# Patient Record
Sex: Female | Born: 1940 | Race: White | Hispanic: No | Smoking: Never smoker
Health system: Southern US, Community
[De-identification: ages and names within clinical notes are randomized; demographics above are authoritative.]

## PROBLEM LIST (undated history)

## (undated) DIAGNOSIS — E039 Hypothyroidism, unspecified: Secondary | ICD-10-CM

## (undated) DIAGNOSIS — H409 Unspecified glaucoma: Secondary | ICD-10-CM

## (undated) DIAGNOSIS — M8580 Other specified disorders of bone density and structure, unspecified site: Secondary | ICD-10-CM

## (undated) DIAGNOSIS — Z853 Personal history of malignant neoplasm of breast: Secondary | ICD-10-CM

## (undated) HISTORY — DX: Other specified disorders of bone density and structure, unspecified site: M85.80

## (undated) HISTORY — DX: Unspecified glaucoma: H40.9

## (undated) HISTORY — PX: OTHER SURGICAL HISTORY: SHX169

## (undated) HISTORY — DX: Hypothyroidism, unspecified: E03.9

## (undated) HISTORY — DX: Personal history of malignant neoplasm of breast: Z85.3

---

## 1996-11-30 DIAGNOSIS — Z853 Personal history of malignant neoplasm of breast: Secondary | ICD-10-CM

## 1996-11-30 HISTORY — PX: BREAST LUMPECTOMY: SHX2

## 1996-11-30 HISTORY — DX: Personal history of malignant neoplasm of breast: Z85.3

## 1996-11-30 HISTORY — PX: OTHER SURGICAL HISTORY: SHX169

## 1998-02-28 ENCOUNTER — Encounter: Admission: RE | Admit: 1998-02-28 | Discharge: 1998-05-29 | Payer: Self-pay | Admitting: Radiation Oncology

## 1998-04-23 ENCOUNTER — Other Ambulatory Visit: Admission: RE | Admit: 1998-04-23 | Discharge: 1998-04-23 | Payer: Self-pay | Admitting: Gynecology

## 1998-04-25 ENCOUNTER — Other Ambulatory Visit: Admission: RE | Admit: 1998-04-25 | Discharge: 1998-04-25 | Payer: Self-pay | Admitting: Gynecology

## 1998-06-06 ENCOUNTER — Ambulatory Visit (HOSPITAL_COMMUNITY): Admission: RE | Admit: 1998-06-06 | Discharge: 1998-06-06 | Payer: Self-pay | Admitting: Gynecology

## 1999-05-19 ENCOUNTER — Other Ambulatory Visit: Admission: RE | Admit: 1999-05-19 | Discharge: 1999-05-19 | Payer: Self-pay | Admitting: Gynecology

## 1999-08-29 ENCOUNTER — Ambulatory Visit (HOSPITAL_COMMUNITY): Admission: RE | Admit: 1999-08-29 | Discharge: 1999-08-29 | Payer: Self-pay | Admitting: Gastroenterology

## 2000-06-28 ENCOUNTER — Other Ambulatory Visit: Admission: RE | Admit: 2000-06-28 | Discharge: 2000-06-28 | Payer: Self-pay | Admitting: Gynecology

## 2004-08-05 ENCOUNTER — Other Ambulatory Visit: Admission: RE | Admit: 2004-08-05 | Discharge: 2004-08-05 | Payer: Self-pay | Admitting: Obstetrics and Gynecology

## 2004-10-22 ENCOUNTER — Encounter (INDEPENDENT_AMBULATORY_CARE_PROVIDER_SITE_OTHER): Payer: Self-pay | Admitting: Internal Medicine

## 2004-10-22 ENCOUNTER — Ambulatory Visit: Payer: Self-pay | Admitting: Family Medicine

## 2004-10-24 ENCOUNTER — Encounter (INDEPENDENT_AMBULATORY_CARE_PROVIDER_SITE_OTHER): Payer: Self-pay | Admitting: Internal Medicine

## 2004-10-24 ENCOUNTER — Encounter: Admission: RE | Admit: 2004-10-24 | Discharge: 2004-10-24 | Payer: Self-pay | Admitting: Family Medicine

## 2004-11-11 ENCOUNTER — Ambulatory Visit: Payer: Self-pay | Admitting: Family Medicine

## 2005-02-13 ENCOUNTER — Ambulatory Visit: Payer: Self-pay | Admitting: Hematology & Oncology

## 2005-03-26 ENCOUNTER — Ambulatory Visit: Payer: Self-pay | Admitting: Family Medicine

## 2005-03-26 ENCOUNTER — Encounter (INDEPENDENT_AMBULATORY_CARE_PROVIDER_SITE_OTHER): Payer: Self-pay | Admitting: Internal Medicine

## 2005-04-01 ENCOUNTER — Ambulatory Visit: Payer: Self-pay | Admitting: Family Medicine

## 2005-08-14 ENCOUNTER — Ambulatory Visit: Payer: Self-pay | Admitting: Hematology & Oncology

## 2005-08-27 ENCOUNTER — Other Ambulatory Visit: Admission: RE | Admit: 2005-08-27 | Discharge: 2005-08-27 | Payer: Self-pay | Admitting: Obstetrics and Gynecology

## 2005-12-09 ENCOUNTER — Ambulatory Visit: Payer: Self-pay | Admitting: Internal Medicine

## 2006-02-12 ENCOUNTER — Ambulatory Visit: Payer: Self-pay | Admitting: Hematology & Oncology

## 2006-08-12 ENCOUNTER — Ambulatory Visit: Payer: Self-pay | Admitting: Hematology & Oncology

## 2006-08-16 LAB — COMPREHENSIVE METABOLIC PANEL
ALT: 13 U/L (ref 0–40)
AST: 19 U/L (ref 0–37)
Albumin: 4.3 g/dL (ref 3.5–5.2)
Alkaline Phosphatase: 56 U/L (ref 39–117)
BUN: 19 mg/dL (ref 6–23)
CO2: 24 mEq/L (ref 19–32)
Calcium: 9.4 mg/dL (ref 8.4–10.5)
Chloride: 105 mEq/L (ref 96–112)
Creatinine, Ser: 0.74 mg/dL (ref 0.40–1.20)
Glucose, Bld: 120 mg/dL — ABNORMAL HIGH (ref 70–99)
Potassium: 4 mEq/L (ref 3.5–5.3)
Sodium: 140 mEq/L (ref 135–145)
Total Bilirubin: 0.5 mg/dL (ref 0.3–1.2)
Total Protein: 7.3 g/dL (ref 6.0–8.3)

## 2006-08-16 LAB — CBC WITH DIFFERENTIAL/PLATELET
BASO%: 0.3 % (ref 0.0–2.0)
Basophils Absolute: 0 10*3/uL (ref 0.0–0.1)
EOS%: 2.1 % (ref 0.0–7.0)
Eosinophils Absolute: 0.1 10*3/uL (ref 0.0–0.5)
HCT: 41 % (ref 34.8–46.6)
HGB: 14 g/dL (ref 11.6–15.9)
LYMPH%: 32 % (ref 14.0–48.0)
MCH: 30.1 pg (ref 26.0–34.0)
MCHC: 34.1 g/dL (ref 32.0–36.0)
MCV: 88.3 fL (ref 81.0–101.0)
MONO#: 0.8 10*3/uL (ref 0.1–0.9)
MONO%: 11.9 % (ref 0.0–13.0)
NEUT#: 3.6 10*3/uL (ref 1.5–6.5)
NEUT%: 53.7 % (ref 39.6–76.8)
Platelets: 215 10*3/uL (ref 145–400)
RBC: 4.65 10*6/uL (ref 3.70–5.32)
RDW: 13.3 % (ref 11.3–14.5)
WBC: 6.7 10*3/uL (ref 3.9–10.0)
lymph#: 2.1 10*3/uL (ref 0.9–3.3)

## 2006-08-16 LAB — TSH: TSH: 0.191 u[IU]/mL — ABNORMAL LOW (ref 0.350–5.500)

## 2006-09-02 ENCOUNTER — Other Ambulatory Visit: Admission: RE | Admit: 2006-09-02 | Discharge: 2006-09-02 | Payer: Self-pay | Admitting: Obstetrics and Gynecology

## 2007-02-09 ENCOUNTER — Ambulatory Visit: Payer: Self-pay | Admitting: Hematology & Oncology

## 2007-02-14 LAB — COMPREHENSIVE METABOLIC PANEL
ALT: 15 U/L (ref 0–35)
AST: 21 U/L (ref 0–37)
Albumin: 4.5 g/dL (ref 3.5–5.2)
Alkaline Phosphatase: 50 U/L (ref 39–117)
BUN: 25 mg/dL — ABNORMAL HIGH (ref 6–23)
CO2: 24 mEq/L (ref 19–32)
Calcium: 9.3 mg/dL (ref 8.4–10.5)
Chloride: 106 mEq/L (ref 96–112)
Creatinine, Ser: 0.84 mg/dL (ref 0.40–1.20)
Glucose, Bld: 97 mg/dL (ref 70–99)
Potassium: 4.2 mEq/L (ref 3.5–5.3)
Sodium: 139 mEq/L (ref 135–145)
Total Bilirubin: 0.5 mg/dL (ref 0.3–1.2)
Total Protein: 7.2 g/dL (ref 6.0–8.3)

## 2007-02-14 LAB — CBC WITH DIFFERENTIAL/PLATELET
BASO%: 0.4 % (ref 0.0–2.0)
Basophils Absolute: 0 10*3/uL (ref 0.0–0.1)
EOS%: 2.8 % (ref 0.0–7.0)
Eosinophils Absolute: 0.2 10*3/uL (ref 0.0–0.5)
HCT: 42.7 % (ref 34.8–46.6)
HGB: 14.7 g/dL (ref 11.6–15.9)
LYMPH%: 33.1 % (ref 14.0–48.0)
MCH: 30.5 pg (ref 26.0–34.0)
MCHC: 34.5 g/dL (ref 32.0–36.0)
MCV: 88.4 fL (ref 81.0–101.0)
MONO#: 0.9 10*3/uL (ref 0.1–0.9)
MONO%: 13.4 % — ABNORMAL HIGH (ref 0.0–13.0)
NEUT#: 3.3 10*3/uL (ref 1.5–6.5)
NEUT%: 50.3 % (ref 39.6–76.8)
Platelets: 205 10*3/uL (ref 145–400)
RBC: 4.83 10*6/uL (ref 3.70–5.32)
RDW: 12.9 % (ref 11.3–14.5)
WBC: 6.5 10*3/uL (ref 3.9–10.0)
lymph#: 2.2 10*3/uL (ref 0.9–3.3)

## 2007-02-14 LAB — TSH: TSH: 0.13 u[IU]/mL — ABNORMAL LOW (ref 0.350–5.500)

## 2007-08-11 ENCOUNTER — Ambulatory Visit: Payer: Self-pay | Admitting: Hematology & Oncology

## 2007-08-15 LAB — COMPREHENSIVE METABOLIC PANEL
ALT: 14 U/L (ref 0–35)
AST: 21 U/L (ref 0–37)
Albumin: 4.5 g/dL (ref 3.5–5.2)
Alkaline Phosphatase: 57 U/L (ref 39–117)
BUN: 20 mg/dL (ref 6–23)
CO2: 23 mEq/L (ref 19–32)
Calcium: 9.2 mg/dL (ref 8.4–10.5)
Chloride: 105 mEq/L (ref 96–112)
Creatinine, Ser: 0.77 mg/dL (ref 0.40–1.20)
Glucose, Bld: 99 mg/dL (ref 70–99)
Potassium: 4.3 mEq/L (ref 3.5–5.3)
Sodium: 140 mEq/L (ref 135–145)
Total Bilirubin: 0.5 mg/dL (ref 0.3–1.2)
Total Protein: 7.4 g/dL (ref 6.0–8.3)

## 2007-08-15 LAB — CBC WITH DIFFERENTIAL/PLATELET
BASO%: 0.5 % (ref 0.0–2.0)
Basophils Absolute: 0 10*3/uL (ref 0.0–0.1)
EOS%: 3.2 % (ref 0.0–7.0)
Eosinophils Absolute: 0.2 10*3/uL (ref 0.0–0.5)
HCT: 42.6 % (ref 34.8–46.6)
HGB: 14.9 g/dL (ref 11.6–15.9)
LYMPH%: 33.3 % (ref 14.0–48.0)
MCH: 30.7 pg (ref 26.0–34.0)
MCHC: 34.9 g/dL (ref 32.0–36.0)
MCV: 88 fL (ref 81.0–101.0)
MONO#: 0.8 10*3/uL (ref 0.1–0.9)
MONO%: 11.4 % (ref 0.0–13.0)
NEUT#: 3.6 10*3/uL (ref 1.5–6.5)
NEUT%: 51.6 % (ref 39.6–76.8)
Platelets: 215 10*3/uL (ref 145–400)
RBC: 4.84 10*6/uL (ref 3.70–5.32)
RDW: 13.6 % (ref 11.3–14.5)
WBC: 7.1 10*3/uL (ref 3.9–10.0)
lymph#: 2.3 10*3/uL (ref 0.9–3.3)

## 2007-08-15 LAB — TSH: TSH: 0.134 u[IU]/mL — ABNORMAL LOW (ref 0.350–5.500)

## 2007-10-17 DIAGNOSIS — J020 Streptococcal pharyngitis: Secondary | ICD-10-CM | POA: Insufficient documentation

## 2007-10-19 ENCOUNTER — Ambulatory Visit: Payer: Self-pay | Admitting: Internal Medicine

## 2007-10-19 DIAGNOSIS — Z8709 Personal history of other diseases of the respiratory system: Secondary | ICD-10-CM | POA: Insufficient documentation

## 2007-10-19 DIAGNOSIS — Z853 Personal history of malignant neoplasm of breast: Secondary | ICD-10-CM | POA: Insufficient documentation

## 2007-10-19 DIAGNOSIS — E039 Hypothyroidism, unspecified: Secondary | ICD-10-CM | POA: Insufficient documentation

## 2007-10-19 LAB — CONVERTED CEMR LAB: Rapid Strep: POSITIVE

## 2007-11-02 ENCOUNTER — Ambulatory Visit: Payer: Self-pay | Admitting: Family Medicine

## 2008-01-25 ENCOUNTER — Telehealth (INDEPENDENT_AMBULATORY_CARE_PROVIDER_SITE_OTHER): Payer: Self-pay | Admitting: Internal Medicine

## 2008-01-27 ENCOUNTER — Telehealth (INDEPENDENT_AMBULATORY_CARE_PROVIDER_SITE_OTHER): Payer: Self-pay | Admitting: Internal Medicine

## 2008-02-09 ENCOUNTER — Ambulatory Visit: Payer: Self-pay | Admitting: Hematology & Oncology

## 2008-02-13 LAB — COMPREHENSIVE METABOLIC PANEL
ALT: 14 U/L (ref 0–35)
AST: 19 U/L (ref 0–37)
Albumin: 4.6 g/dL (ref 3.5–5.2)
Alkaline Phosphatase: 54 U/L (ref 39–117)
BUN: 23 mg/dL (ref 6–23)
CO2: 25 mEq/L (ref 19–32)
Calcium: 9.5 mg/dL (ref 8.4–10.5)
Chloride: 104 mEq/L (ref 96–112)
Creatinine, Ser: 0.73 mg/dL (ref 0.40–1.20)
Glucose, Bld: 98 mg/dL (ref 70–99)
Potassium: 4 mEq/L (ref 3.5–5.3)
Sodium: 142 mEq/L (ref 135–145)
Total Bilirubin: 0.5 mg/dL (ref 0.3–1.2)
Total Protein: 7.7 g/dL (ref 6.0–8.3)

## 2008-02-13 LAB — CBC WITH DIFFERENTIAL/PLATELET
BASO%: 0.1 % (ref 0.0–2.0)
Basophils Absolute: 0 10*3/uL (ref 0.0–0.1)
EOS%: 2.8 % (ref 0.0–7.0)
Eosinophils Absolute: 0.2 10*3/uL (ref 0.0–0.5)
HCT: 42 % (ref 34.8–46.6)
HGB: 14.6 g/dL (ref 11.6–15.9)
LYMPH%: 26 % (ref 14.0–48.0)
MCH: 30.5 pg (ref 26.0–34.0)
MCHC: 34.6 g/dL (ref 32.0–36.0)
MCV: 87.9 fL (ref 81.0–101.0)
MONO#: 0.9 10*3/uL (ref 0.1–0.9)
MONO%: 10.5 % (ref 0.0–13.0)
NEUT#: 5.2 10*3/uL (ref 1.5–6.5)
NEUT%: 60.6 % (ref 39.6–76.8)
Platelets: 234 10*3/uL (ref 145–400)
RBC: 4.78 10*6/uL (ref 3.70–5.32)
RDW: 13.5 % (ref 11.3–14.5)
WBC: 8.5 10*3/uL (ref 3.9–10.0)
lymph#: 2.2 10*3/uL (ref 0.9–3.3)

## 2008-02-13 LAB — TSH: TSH: 0.185 u[IU]/mL — ABNORMAL LOW (ref 0.350–5.500)

## 2008-05-29 ENCOUNTER — Ambulatory Visit: Payer: Self-pay | Admitting: Family Medicine

## 2008-05-29 DIAGNOSIS — J019 Acute sinusitis, unspecified: Secondary | ICD-10-CM | POA: Insufficient documentation

## 2008-06-28 DIAGNOSIS — M8580 Other specified disorders of bone density and structure, unspecified site: Secondary | ICD-10-CM | POA: Insufficient documentation

## 2008-08-10 ENCOUNTER — Ambulatory Visit: Payer: Self-pay | Admitting: Hematology & Oncology

## 2008-08-13 LAB — COMPREHENSIVE METABOLIC PANEL
ALT: 16 U/L (ref 0–35)
AST: 19 U/L (ref 0–37)
Albumin: 4.4 g/dL (ref 3.5–5.2)
Alkaline Phosphatase: 52 U/L (ref 39–117)
BUN: 17 mg/dL (ref 6–23)
CO2: 21 mEq/L (ref 19–32)
Calcium: 9.1 mg/dL (ref 8.4–10.5)
Chloride: 109 mEq/L (ref 96–112)
Creatinine, Ser: 0.88 mg/dL (ref 0.40–1.20)
Glucose, Bld: 109 mg/dL — ABNORMAL HIGH (ref 70–99)
Potassium: 3.8 mEq/L (ref 3.5–5.3)
Sodium: 142 mEq/L (ref 135–145)
Total Bilirubin: 0.5 mg/dL (ref 0.3–1.2)
Total Protein: 7.1 g/dL (ref 6.0–8.3)

## 2008-08-13 LAB — CBC WITH DIFFERENTIAL (CANCER CENTER ONLY)
BASO#: 0 10*3/uL (ref 0.0–0.2)
BASO%: 0.5 % (ref 0.0–2.0)
EOS%: 3.4 % (ref 0.0–7.0)
Eosinophils Absolute: 0.2 10*3/uL (ref 0.0–0.5)
HCT: 42.4 % (ref 34.8–46.6)
HGB: 14.4 g/dL (ref 11.6–15.9)
LYMPH#: 1.8 10*3/uL (ref 0.9–3.3)
LYMPH%: 26 % (ref 14.0–48.0)
MCH: 29.4 pg (ref 26.0–34.0)
MCHC: 34 g/dL (ref 32.0–36.0)
MCV: 86 fL (ref 81–101)
MONO#: 0.6 10*3/uL (ref 0.1–0.9)
MONO%: 8.2 % (ref 0.0–13.0)
NEUT#: 4.2 10*3/uL (ref 1.5–6.5)
NEUT%: 61.9 % (ref 39.6–80.0)
Platelets: 215 10*3/uL (ref 145–400)
RBC: 4.9 10*6/uL (ref 3.70–5.32)
RDW: 12.6 % (ref 10.5–14.6)
WBC: 6.8 10*3/uL (ref 3.9–10.0)

## 2008-08-13 LAB — TSH: TSH: 0.086 u[IU]/mL — ABNORMAL LOW (ref 0.350–4.500)

## 2009-02-15 ENCOUNTER — Ambulatory Visit: Payer: Self-pay | Admitting: Hematology & Oncology

## 2009-02-18 LAB — CBC WITH DIFFERENTIAL (CANCER CENTER ONLY)
BASO#: 0 10*3/uL (ref 0.0–0.2)
BASO%: 0.6 % (ref 0.0–2.0)
EOS%: 3.6 % (ref 0.0–7.0)
Eosinophils Absolute: 0.2 10*3/uL (ref 0.0–0.5)
HCT: 44.8 % (ref 34.8–46.6)
HGB: 14.8 g/dL (ref 11.6–15.9)
LYMPH#: 1.5 10*3/uL (ref 0.9–3.3)
LYMPH%: 31.8 % (ref 14.0–48.0)
MCH: 30 pg (ref 26.0–34.0)
MCHC: 33 g/dL (ref 32.0–36.0)
MCV: 91 fL (ref 81–101)
MONO#: 0.5 10*3/uL (ref 0.1–0.9)
MONO%: 10.8 % (ref 0.0–13.0)
NEUT#: 2.4 10*3/uL (ref 1.5–6.5)
NEUT%: 53.2 % (ref 39.6–80.0)
Platelets: 207 10*3/uL (ref 145–400)
RBC: 4.92 10*6/uL (ref 3.70–5.32)
RDW: 12.3 % (ref 10.5–14.6)
WBC: 4.6 10*3/uL (ref 3.9–10.0)

## 2009-02-18 LAB — COMPREHENSIVE METABOLIC PANEL
ALT: 15 U/L (ref 0–35)
AST: 17 U/L (ref 0–37)
Albumin: 4.2 g/dL (ref 3.5–5.2)
Alkaline Phosphatase: 49 U/L (ref 39–117)
BUN: 15 mg/dL (ref 6–23)
CO2: 28 mEq/L (ref 19–32)
Calcium: 9.9 mg/dL (ref 8.4–10.5)
Chloride: 106 mEq/L (ref 96–112)
Creatinine, Ser: 0.78 mg/dL (ref 0.40–1.20)
Glucose, Bld: 102 mg/dL — ABNORMAL HIGH (ref 70–99)
Potassium: 4.3 mEq/L (ref 3.5–5.3)
Sodium: 141 mEq/L (ref 135–145)
Total Bilirubin: 0.4 mg/dL (ref 0.3–1.2)
Total Protein: 7.1 g/dL (ref 6.0–8.3)

## 2009-02-18 LAB — TSH: TSH: 0.192 u[IU]/mL — ABNORMAL LOW (ref 0.350–4.500)

## 2009-04-11 ENCOUNTER — Emergency Department (HOSPITAL_COMMUNITY): Admission: EM | Admit: 2009-04-11 | Discharge: 2009-04-11 | Payer: Self-pay | Admitting: Emergency Medicine

## 2009-08-13 ENCOUNTER — Ambulatory Visit: Payer: Self-pay | Admitting: Hematology & Oncology

## 2009-08-15 LAB — COMPREHENSIVE METABOLIC PANEL
ALT: 17 U/L (ref 0–35)
AST: 19 U/L (ref 0–37)
Albumin: 4.3 g/dL (ref 3.5–5.2)
Alkaline Phosphatase: 50 U/L (ref 39–117)
BUN: 16 mg/dL (ref 6–23)
CO2: 23 mEq/L (ref 19–32)
Calcium: 9.7 mg/dL (ref 8.4–10.5)
Chloride: 104 mEq/L (ref 96–112)
Creatinine, Ser: 0.8 mg/dL (ref 0.40–1.20)
Glucose, Bld: 124 mg/dL — ABNORMAL HIGH (ref 70–99)
Potassium: 4.3 mEq/L (ref 3.5–5.3)
Sodium: 139 mEq/L (ref 135–145)
Total Bilirubin: 0.8 mg/dL (ref 0.3–1.2)
Total Protein: 7.2 g/dL (ref 6.0–8.3)

## 2009-08-15 LAB — CBC WITH DIFFERENTIAL (CANCER CENTER ONLY)
BASO#: 0.1 10*3/uL (ref 0.0–0.2)
BASO%: 1 % (ref 0.0–2.0)
EOS%: 2.6 % (ref 0.0–7.0)
Eosinophils Absolute: 0.2 10*3/uL (ref 0.0–0.5)
HCT: 44.5 % (ref 34.8–46.6)
HGB: 15.1 g/dL (ref 11.6–15.9)
LYMPH#: 1.9 10*3/uL (ref 0.9–3.3)
LYMPH%: 30.4 % (ref 14.0–48.0)
MCH: 30.3 pg (ref 26.0–34.0)
MCHC: 34 g/dL (ref 32.0–36.0)
MCV: 89 fL (ref 81–101)
MONO#: 0.7 10*3/uL (ref 0.1–0.9)
MONO%: 11.7 % (ref 0.0–13.0)
NEUT#: 3.3 10*3/uL (ref 1.5–6.5)
NEUT%: 54.3 % (ref 39.6–80.0)
Platelets: 211 10*3/uL (ref 145–400)
RBC: 4.99 10*6/uL (ref 3.70–5.32)
RDW: 13 % (ref 10.5–14.6)
WBC: 6.1 10*3/uL (ref 3.9–10.0)

## 2010-08-12 ENCOUNTER — Ambulatory Visit: Payer: Self-pay | Admitting: Hematology & Oncology

## 2010-08-14 LAB — CBC WITH DIFFERENTIAL (CANCER CENTER ONLY)
BASO#: 0 10*3/uL (ref 0.0–0.2)
BASO%: 0.6 % (ref 0.0–2.0)
EOS%: 2.7 % (ref 0.0–7.0)
Eosinophils Absolute: 0.2 10*3/uL (ref 0.0–0.5)
HCT: 45 % (ref 34.8–46.6)
HGB: 15.1 g/dL (ref 11.6–15.9)
LYMPH#: 1.7 10*3/uL (ref 0.9–3.3)
LYMPH%: 29.6 % (ref 14.0–48.0)
MCH: 30.2 pg (ref 26.0–34.0)
MCHC: 33.5 g/dL (ref 32.0–36.0)
MCV: 90 fL (ref 81–101)
MONO#: 0.6 10*3/uL (ref 0.1–0.9)
MONO%: 10 % (ref 0.0–13.0)
NEUT#: 3.2 10*3/uL (ref 1.5–6.5)
NEUT%: 57.1 % (ref 39.6–80.0)
Platelets: 219 10*3/uL (ref 145–400)
RBC: 4.99 10*6/uL (ref 3.70–5.32)
RDW: 12.1 % (ref 10.5–14.6)
WBC: 5.6 10*3/uL (ref 3.9–10.0)

## 2010-08-15 LAB — COMPREHENSIVE METABOLIC PANEL
ALT: 15 U/L (ref 0–35)
AST: 20 U/L (ref 0–37)
Albumin: 4.5 g/dL (ref 3.5–5.2)
Alkaline Phosphatase: 56 U/L (ref 39–117)
BUN: 19 mg/dL (ref 6–23)
CO2: 25 mEq/L (ref 19–32)
Calcium: 9.7 mg/dL (ref 8.4–10.5)
Chloride: 104 mEq/L (ref 96–112)
Creatinine, Ser: 0.79 mg/dL (ref 0.40–1.20)
Glucose, Bld: 94 mg/dL (ref 70–99)
Potassium: 4.2 mEq/L (ref 3.5–5.3)
Sodium: 140 mEq/L (ref 135–145)
Total Bilirubin: 0.6 mg/dL (ref 0.3–1.2)
Total Protein: 7.4 g/dL (ref 6.0–8.3)

## 2010-08-15 LAB — TSH: TSH: 0.196 u[IU]/mL — ABNORMAL LOW (ref 0.350–4.500)

## 2010-08-15 LAB — VITAMIN D 25 HYDROXY (VIT D DEFICIENCY, FRACTURES): Vit D, 25-Hydroxy: 50 ng/mL (ref 30–89)

## 2010-08-19 ENCOUNTER — Ambulatory Visit: Payer: Self-pay | Admitting: Family Medicine

## 2010-12-30 NOTE — Assessment & Plan Note (Signed)
Summary: ?SINUS INFECTION/[name]'S PT/CLE   Vital Signs:  Patient profile:   70 year old female Weight:      132.75 pounds Temp:     98.8 degrees F oral Pulse rate:   76 / minute Pulse rhythm:   regular BP sitting:   138 / 70  (right arm) Cuff size:   regular  Vitals Entered By: [name] Dance CMA [name]) (08/23/10 3:43 PM) CC: Sinus infection   History of Present Illness: CC: " i have a sinus infection"  2-3 day h/o sinus pressure, 1d h/o L maxillary sinus tenderness and swelling, also "can smell infection starting".  + ear pain as well.  + draining alot, purulent discharge.  Using neti pot.  Also zyrtec as well.  No fevers or chills.  No tooth pain.  No ST, abd pain, n/v/d, urinary changes.  No sick contacts.  No smokers.  + h/o recurrent sinus infections, last 2009, had CT scan with pan sinusitis, treated with augmentin for 42 days and since then had been doing well.  Now this is 1st or 2nd sinusitis since.  Current Medications (verified): 1)  Synthroid 75 Mcg  Tabs (Levothyroxine Sodium) .... Take One By Mouth Once A Day 2)  Multivitamins   Tabs (Multiple Vitamin) .... Take One By Mouth Once A Day 3)  Calcium 1500 Mg .... Take One By Mouth Once A Day 4)  Aspirin 81 Mg  Tbec (Aspirin) .... Take One By Mouth Once A Day  Allergies: 1)  ! 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  NAD Head:  Normocephalic and atraumatic without obvious abnormalities. No apparent alopecia or balding.  + R sinus maxillary tenderness.  slight swelling R Maxillary region, no erythema/warmth Eyes:  no injection.  PERRLA Ears:  External ear exam shows no significant lesions or deformities.  Otoscopic examination reveals clear canals, tympanic membranes are intact bilaterally without bulging, retraction, inflammation or discharge. Hearing is grossly normal bilaterally. Nose:  External nasal examination shows no deformity or inflammation. Nasal mucosa are pink and moist without lesions or exudates. Mouth:  no exudates and pharyngeal erythema.  + cobblestoning Neck:  supple and full ROM.   Lungs:  normal respiratory effort, no intercostal retractions, no accessory muscle use, and normal breath sounds.   Heart:  normal rate, regular rhythm, and no murmur.   Pulses:  2+ rad pulses Extremities:  no edema Skin:  turgor normal, color normal, no rashes, and no suspicious lesions.     Impression & Recommendations:  Problem # 1:  SINUSITIS- ACUTE-NOS (ICD-461.9) Instructed on treatment. Call if symptoms persist or worsen.   rec stop antihistamine (drying).  Continue neti pot, fluids, rest.  amox given h/o severe sinusitis in past.  The following medications were removed from the medication list:    Augmentin Xr 1000-62.5 Mg Tb12 (Amoxicillin-pot clavulanate) .Marland Kitchen... Take  2 two times a day with food Her updated medication list for this problem includes:    Amoxicillin 875 Mg Tabs (Amoxicillin) .Marland Kitchen... Take one by mouth two times a day x 7 days  Complete Medication List: 1)  Synthroid 75 Mcg Tabs (Levothyroxine sodium) .... Take one by mouth once a day 2)  Multivitamins Tabs (Multiple vitamin) .... Take one by mouth once a day 3)  Calcium 1500 Mg  .... Take one by mouth once a day 4)  Aspirin 81 Mg Tbec (Aspirin) .... Take one by mouth once a day 5)   Amoxicillin 875 Mg Tabs (Amoxicillin) .... Take one by mouth two times a day x 7 days  Patient Instructions: 1)  Sounds like you have sinusitis again.  Although most are viral, we will treat you with antibiotics because of your history of bad sinus infections in the past. 2)  Continue neti pot as well as lots of fluids.  If you get stopped up, ask pharmacist for guaifenesin IR medication OTC.   3)  Amoxicillin twice daily for 7 days. 4)  Please return as needed, call clinic with questions. Prescriptions: AMOXICILLIN 875 MG TABS (AMOXICILLIN) take one by mouth two times a day x 7 days  #14 x 0   Entered and Authorized by:   Eustaquio Boyden  MD   Signed by:   Eustaquio Boyden  MD on 08/19/2010   Method used:   Electronically to        [facility]* (retail)       [address]       [place], [place]  [address]       Ph: [phone]       Fax: [phone]   RxID:   [id number]   Current Allergies (reviewed today): ! Aleda Grana

## 2011-08-13 ENCOUNTER — Other Ambulatory Visit: Payer: Self-pay | Admitting: Hematology & Oncology

## 2011-08-13 ENCOUNTER — Encounter (HOSPITAL_BASED_OUTPATIENT_CLINIC_OR_DEPARTMENT_OTHER): Payer: Medicare Other | Admitting: Hematology & Oncology

## 2011-08-13 DIAGNOSIS — E039 Hypothyroidism, unspecified: Secondary | ICD-10-CM

## 2011-08-13 DIAGNOSIS — E559 Vitamin D deficiency, unspecified: Secondary | ICD-10-CM

## 2011-08-13 DIAGNOSIS — Z853 Personal history of malignant neoplasm of breast: Secondary | ICD-10-CM

## 2011-08-13 DIAGNOSIS — C50219 Malignant neoplasm of upper-inner quadrant of unspecified female breast: Secondary | ICD-10-CM

## 2011-08-13 LAB — CBC WITH DIFFERENTIAL (CANCER CENTER ONLY)
BASO%: 0.4 % (ref 0.0–2.0)
Eosinophils Absolute: 0.3 10*3/uL (ref 0.0–0.5)
HCT: 41.8 % (ref 34.8–46.6)
LYMPH#: 1.6 10*3/uL (ref 0.9–3.3)
LYMPH%: 24 % (ref 14.0–48.0)
MCV: 87 fL (ref 81–101)
MONO#: 1.1 10*3/uL — ABNORMAL HIGH (ref 0.1–0.9)
NEUT%: 55.2 % (ref 39.6–80.0)
RBC: 4.83 10*6/uL (ref 3.70–5.32)
RDW: 13.2 % (ref 11.1–15.7)
WBC: 6.7 10*3/uL (ref 3.9–10.0)

## 2011-08-14 LAB — COMPREHENSIVE METABOLIC PANEL
ALT: 28 U/L (ref 0–35)
CO2: 26 mEq/L (ref 19–32)
Calcium: 9.3 mg/dL (ref 8.4–10.5)
Chloride: 104 mEq/L (ref 96–112)
Creatinine, Ser: 0.86 mg/dL (ref 0.50–1.10)
Glucose, Bld: 83 mg/dL (ref 70–99)
Sodium: 141 mEq/L (ref 135–145)
Total Bilirubin: 0.5 mg/dL (ref 0.3–1.2)
Total Protein: 6.9 g/dL (ref 6.0–8.3)

## 2011-08-14 LAB — VITAMIN D 25 HYDROXY (VIT D DEFICIENCY, FRACTURES): Vit D, 25-Hydroxy: 41 ng/mL (ref 30–89)

## 2011-08-14 LAB — TSH: TSH: 0.335 u[IU]/mL — ABNORMAL LOW (ref 0.350–4.500)

## 2011-08-24 ENCOUNTER — Encounter: Payer: Self-pay | Admitting: Family Medicine

## 2011-08-25 ENCOUNTER — Encounter: Payer: Self-pay | Admitting: Family Medicine

## 2011-08-25 ENCOUNTER — Ambulatory Visit (INDEPENDENT_AMBULATORY_CARE_PROVIDER_SITE_OTHER): Payer: Medicare Other | Admitting: Family Medicine

## 2011-08-25 DIAGNOSIS — M79609 Pain in unspecified limb: Secondary | ICD-10-CM

## 2011-08-25 DIAGNOSIS — M179 Osteoarthritis of knee, unspecified: Secondary | ICD-10-CM | POA: Insufficient documentation

## 2011-08-25 DIAGNOSIS — M79604 Pain in right leg: Secondary | ICD-10-CM

## 2011-08-25 DIAGNOSIS — M1710 Unilateral primary osteoarthritis, unspecified knee: Secondary | ICD-10-CM | POA: Insufficient documentation

## 2011-08-25 DIAGNOSIS — J019 Acute sinusitis, unspecified: Secondary | ICD-10-CM

## 2011-08-25 MED ORDER — AMOXICILLIN-POT CLAVULANATE 875-125 MG PO TABS: 1.0000 | ORAL_TABLET | Freq: Two times a day (BID) | ORAL | Status: AC

## 2011-08-25 NOTE — Assessment & Plan Note (Signed)
Treat with augmentin. update if not improved after tx.

## 2011-08-25 NOTE — Assessment & Plan Note (Signed)
Exam benign No indication of DVT. ? Shin splints vs anterior leg strain.  Treat with icy hot, advil, stretching exercises provided from [facility] pt advisor (shin splint handout)

## 2011-08-25 NOTE — Patient Instructions (Signed)
You have a sinus infection. Take medicine as prescribed: augmentin for 10 days. Push fluids and plenty of rest. Nasal saline irrigation or neti pot to help drain sinuses. May use simple mucinex with plenty of fluid to help mobilize mucous. Return if fever >101.5, trouble opening/closing mouth, difficulty swallowing, or worsening. For leg - could be strain, lets watch for now.  Try stretching exercises.  Continue icy hot.  If not improved or any worsening, let us know.

## 2011-08-25 NOTE — Progress Notes (Signed)
  Subjective:    Patient ID: Joanna White, female    DOB: [birth year], 70 y.o.   MRN: [id number]  HPI CC: L sinus pain, leg pain  1 1/2 wk h/o left sinus pain and congestion, + coughing, dry cough.  Feels maxillary pressure left side.  Using neti pot, then congestion comes back.  Also tried advil.  No fevers/chills, no abd pain, n/v.  No ear pain or tooth pain.  No smokers at home.  No sick contacts at home.  No h/o asthma or COPD in past.  ALSO - L knee h/o pain 1 year ago, R anterior shin pain.  Seen ortho.  Told may have strained right leg.  Pain worse with sitting, laying down.  Improved with exercise, activity.  No recent increase in activity.  No calf pain.  Using cat's claw.  advil and icy/hot improves pain.  Going on last few weeks.  No redness, swelling.   Review of Systems Per HPI    Objective:   Physical Exam  Nursing note and vitals reviewed. Constitutional: She appears well-developed and well-nourished. No distress.  HENT:  Head: Normocephalic and atraumatic.  Right Ear: Hearing, tympanic membrane, external ear and ear canal normal.  Left Ear: Hearing, tympanic membrane, external ear and ear canal normal.  Nose: No mucosal edema or rhinorrhea. Right sinus exhibits no maxillary sinus tenderness and no frontal sinus tenderness. Left sinus exhibits maxillary sinus tenderness. Left sinus exhibits no frontal sinus tenderness.  Mouth/Throat: Uvula is midline, oropharynx is clear and moist and mucous membranes are normal. No oropharyngeal exudate, posterior oropharyngeal edema, posterior oropharyngeal erythema or tonsillar abscesses.  Eyes: Conjunctivae and EOM are normal. Pupils are equal, round, and reactive to light. No scleral icterus.  Neck: Normal range of motion. Neck supple.  Cardiovascular: Normal rate, regular rhythm, normal heart sounds and intact distal pulses.   No murmur heard. Pulses:      Radial pulses are 2+ on the right side, and 2+ on the left side.      Dorsalis pedis pulses are 2+ on the right side, and 2+ on the left side.  Pulmonary/Chest: Effort normal and breath sounds normal. No respiratory distress. She has no wheezes. She has no rales.  Musculoskeletal: Normal range of motion. She exhibits no edema.       No calf pain, no palp cords. Tender right anterior lateral shin. No erythema/edema.  Lymphadenopathy:    She has no cervical adenopathy.  Skin: Skin is warm and dry. No rash noted.  Psychiatric: She has a normal mood and affect.          Assessment & Plan:

## 2011-08-31 DIAGNOSIS — M8580 Other specified disorders of bone density and structure, unspecified site: Secondary | ICD-10-CM

## 2011-08-31 HISTORY — DX: Other specified disorders of bone density and structure, unspecified site: M85.80

## 2012-01-12 ENCOUNTER — Encounter: Payer: Self-pay | Admitting: Family Medicine

## 2012-01-12 ENCOUNTER — Ambulatory Visit (INDEPENDENT_AMBULATORY_CARE_PROVIDER_SITE_OTHER): Payer: Medicare Other | Admitting: Family Medicine

## 2012-01-12 ENCOUNTER — Telehealth: Payer: Self-pay | Admitting: Family Medicine

## 2012-01-12 VITALS — BP 128/80 | HR 80 | Temp 98.5°F | Wt 139.2 lb

## 2012-01-12 DIAGNOSIS — J019 Acute sinusitis, unspecified: Secondary | ICD-10-CM

## 2012-01-12 MED ORDER — AMOXICILLIN-POT CLAVULANATE 875-125 MG PO TABS: 1.0000 | ORAL_TABLET | Freq: Two times a day (BID) | ORAL | Status: AC

## 2012-01-12 NOTE — Progress Notes (Signed)
  Subjective:    Patient ID: Joanna White, female    DOB: 27-Jul-1941, 71 y.o.   MRN: [id number]  HPI CC: sinus  Last sinus infection was 08/2011.  H/o pansinusitis on CT scan 2005 s/p extended treatment, then improved but recently more frequent sinus infections.  1d h/o sinus sxs  This morning awoke with sore throat.  Left facial pressure as well as mild PNDrainage.  Mild coughing as well.  So far has tried neti pot which helps.  Also took 2 advil last night.  Has afrin at home.  No fevers/chills, ear pain or tooth pain, abd pain, n/v, rashes.    No smokers at home.  + son sick as well.  No h/o asthma, COPD, seasonal allergies.  Review of Systems Per HPI    Objective:   Physical Exam  Nursing note and vitals reviewed. Constitutional: She appears well-developed and well-nourished. No distress.  HENT:  Head: Normocephalic and atraumatic.  Right Ear: Hearing, tympanic membrane, external ear and ear canal normal.  Left Ear: Hearing, tympanic membrane, external ear and ear canal normal.  Nose: Mucosal edema (R>>L) present. No rhinorrhea. Right sinus exhibits no maxillary sinus tenderness and no frontal sinus tenderness. Left sinus exhibits maxillary sinus tenderness. Left sinus exhibits no frontal sinus tenderness.  Mouth/Throat: Uvula is midline, oropharynx is clear and moist and mucous membranes are normal. No oropharyngeal exudate, posterior oropharyngeal edema, posterior oropharyngeal erythema or tonsillar abscesses.  Eyes: Conjunctivae and EOM are normal. Pupils are equal, round, and reactive to light. No scleral icterus.  Neck: Normal range of motion. Neck supple.  Cardiovascular: Normal rate, regular rhythm, normal heart sounds and intact distal pulses.   No murmur heard. Pulmonary/Chest: Effort normal and breath sounds normal. No respiratory distress. She has no wheezes. She has no rales.  Lymphadenopathy:    She has no cervical adenopathy.  Skin: Skin is warm and dry. No  rash noted.       Assessment & Plan:

## 2012-01-12 NOTE — Assessment & Plan Note (Signed)
In pt with h/o pansinusitis in past and recurrent sinusitis. will treat aggressively with augmentin. Will need to monitor recurrent sinus infections ,consider longer therapy. Last sinusitis 08/2011, prior to that 2009.

## 2012-01-12 NOTE — Telephone Encounter (Signed)
Triage Record Num: [id number] Operator: Valene Bors Patient Name: Joanna White Call Date & Time: 01/12/2012 9:49:25AM Patient Phone: [phone] PCP: Eustaquio Boyden Patient Gender: Female PCP Fax : [phone] Patient DOB: 09/06/1941 Practice Name: [facility] Reason for Call: Caller: Joanna White/Patient; PCP: Eustaquio Boyden; CB#: [phone]; Call regarding starting with eye pressure and Congestion onset 01/11/12 and this morning thoat hurts. She gets frequent Sinus Infections- Last infection in 08/2011. Augmentin for 45 days ~ 5 years ago for Chronic Sinus Issues. No Fever yet. Requesting Antibiotic. Care advice per Sore Throat and Face Pain Protocols and appnt scheduled at 1600 01/12/12 per pt request. ; Protocol(s) Used: Face Pain or Swelling Recommended Outcome per Protocol: Provide Home/Self Care Reason for Outcome: Pressure/pain above or below eyes, near ears over sinuses with or without low grade temperature elevation, up to 101.5 F (38.6 C) Care Advice: ~ Use a cool mist humidifier to moisten air. Be sure to clean according to manufacturer's instructions. Speak with provider if swelling, redness and tenderness over sinuses above or below eyes develops or has temperature greater than 101.5 F (38.6 C). ~ See provider within 24 hours if nasal drainage turns purulent; face pain/pressure worsens when bending over or pain over teeth continues and home care measures not relieving symptoms. ~ ~ Consider use of a saline nasal spray per package directions to help relieve nasal congestion. ~ SYMPTOM / CONDITION MANAGEMENT ~ CAUTIONS Most adults need to drink 6-10 eight-ounce glasses (1.2-2.0 liters) of fluids per day unless previously told to limit fluid intake for other medical reasons. Limit fluids that contain caffeine, sugar or alcohol. Urine will be a very light yellow color when you drink enough fluids. ~ Apply local moist heat (such as a warm, wet wash  cloth covered with plastic wrap) to the area for 15-20 minutes every 2-3 hours while awake. ~ 01/12/2012 10:23:56AM Page 1 of 1 [id number]_[id number] [facility] Triage Call Report Triage Record Num: [id number] Operator: Valene Bors Patient Name: Joanna White Call Date & Time: 01/12/2012 9:49:25AM Patient Phone: [phone] PCP: Eustaquio Boyden Patient Gender: Female PCP Fax : [phone] Patient DOB: 1941-09-10 Practice Name: [facility] Reason for Call: Caller: Joanna White/Patient; PCP: Eustaquio Boyden; CB#: [phone]; Call regarding starting with eye pressure and Congestion onset 01/11/12 and this morning thoat hurts. She gets frequent Sinus Infections- Last infection in 08/2011. Augmentin for 45 days ~ 5 years ago for Chronic Sinus Issues. No Fever yet. Requesting Antibiotic. Care advice per Sore Throat and Face Pain Protocols and appnt scheduled at 1600 01/12/12 per pt request. ; Protocol(s) Used: Sore Throat or Hoarseness Recommended Outcome per Protocol: Provide Home/Self Care Reason for Outcome: Sore throat AND no other symptoms Care Advice: Throat symptoms that do not resolve must be evaluated by provider to rule out serious causes such as cancer. Tobacco use in any form increases risk of cancer. ~ To help prevent the spread of infection, do not share eating or drinking utensils, personal care items like a toothbrush, or food. Wash hands often with soap and water or alcohol-based hand rub. ~ ~ See a provider if have sore throat symptoms for 2 weeks or more, or if have swollen lymph nodes. ~ SYMPTOM / CONDITION MANAGEMENT ~ INFECTION CONTROL ~ CAUTIONS Most adults need to drink 6-10 eight-ounce glasses (1.2-2.0 liters) of fluids per day unless previously told to limit fluid intake for other medical reasons. Limit fluids that contain caffeine, sugar or alcohol. Urine will be a very light yellow color  when you drink enough fluids. ~ Analgesic/Antipyretic  Advice - Acetaminophen: Consider acetaminophen as directed on label or by pharmacist/provider for pain or fever PRECAUTIONS: - Use if there is no history of liver disease, alcoholism, or intake of three or more alcohol drinks per day - Only if approved by provider during pregnancy or when breastfeeding - During pregnancy, acetaminophen should not be taken more than 3 consecutive days without telling provider - Do not exceed recommended dose or frequency ~ Sore Throat Relief: - Use warm salt water gargles 3 to 4 times/day, as needed (1/2 tsp. salt in 8 oz. [.2 liters] water). - Suck on hard candy, nonprescription or herbal throat lozenges (sugar-free if diabetic) - Eat soothing, soft food/fluids (broths, soups, or honey and lemon juice in hot tea, Popsicles, frozen yogurt or sherbet, scrambled eggs, cooked cereals, Jell-O or puddings) whichever is most comforting. - Avoid eating salty, spicy or acidic foods. ~ To help relieve nasal congestion, use nonprescription saline nasal spray according to label instructions or as directed by a healthcare provider. ~ 01/12/2012 10:23:57AM Page 1 of 1 CAN_TriageRpt_V2

## 2012-01-12 NOTE — Telephone Encounter (Signed)
Noted. Will see today.  

## 2012-01-12 NOTE — Patient Instructions (Signed)
You have a sinus infection. Take medicine as prescribed: augmentin twice daily for 10 days Push fluids and plenty of rest. Nasal saline irrigation or neti pot to help drain sinuses. May use simple mucinex with plenty of fluid to help mobilize mucous. Let us know if fever >101.5, trouble opening/closing mouth, difficulty swallowing, or worsening - you may need to be seen again. 

## 2012-02-04 DIAGNOSIS — H4011X0 Primary open-angle glaucoma, stage unspecified: Secondary | ICD-10-CM | POA: Diagnosis not present

## 2012-02-04 DIAGNOSIS — H409 Unspecified glaucoma: Secondary | ICD-10-CM | POA: Diagnosis not present

## 2012-03-03 DIAGNOSIS — E039 Hypothyroidism, unspecified: Secondary | ICD-10-CM | POA: Diagnosis not present

## 2012-03-03 DIAGNOSIS — Z79899 Other long term (current) drug therapy: Secondary | ICD-10-CM | POA: Diagnosis not present

## 2012-03-03 DIAGNOSIS — IMO0002 Reserved for concepts with insufficient information to code with codable children: Secondary | ICD-10-CM | POA: Diagnosis not present

## 2012-03-03 DIAGNOSIS — M25569 Pain in unspecified knee: Secondary | ICD-10-CM | POA: Diagnosis not present

## 2012-03-03 DIAGNOSIS — M1710 Unilateral primary osteoarthritis, unspecified knee: Secondary | ICD-10-CM | POA: Diagnosis not present

## 2012-03-03 DIAGNOSIS — Z853 Personal history of malignant neoplasm of breast: Secondary | ICD-10-CM | POA: Diagnosis not present

## 2012-04-06 DIAGNOSIS — M1710 Unilateral primary osteoarthritis, unspecified knee: Secondary | ICD-10-CM | POA: Diagnosis not present

## 2012-04-06 DIAGNOSIS — IMO0002 Reserved for concepts with insufficient information to code with codable children: Secondary | ICD-10-CM | POA: Diagnosis not present

## 2012-05-09 DIAGNOSIS — H409 Unspecified glaucoma: Secondary | ICD-10-CM | POA: Diagnosis not present

## 2012-05-09 DIAGNOSIS — H4011X0 Primary open-angle glaucoma, stage unspecified: Secondary | ICD-10-CM | POA: Diagnosis not present

## 2012-06-17 DIAGNOSIS — M1710 Unilateral primary osteoarthritis, unspecified knee: Secondary | ICD-10-CM | POA: Diagnosis not present

## 2012-06-20 DIAGNOSIS — Z0389 Encounter for observation for other suspected diseases and conditions ruled out: Secondary | ICD-10-CM | POA: Diagnosis not present

## 2012-06-20 DIAGNOSIS — Z01818 Encounter for other preprocedural examination: Secondary | ICD-10-CM | POA: Diagnosis not present

## 2012-06-20 LAB — CBC
Hemoglobin: 15 g/dL (ref 12.0–16.0)
WBC: 7

## 2012-06-20 LAB — PTH, INTACT: PTH: 17 pg/mL

## 2012-06-20 LAB — COMPREHENSIVE METABOLIC PANEL
BUN: 16 mg/dL (ref 4–21)
Creat: 0.6
Glucose: 83
Sodium: 145 mmol/L (ref 137–147)
Total Bilirubin: 0.7 mg/dL

## 2012-06-20 LAB — HEMOGLOBIN A1C: A1c: 5.3

## 2012-06-20 LAB — HEPATITIS B SURFACE ANTIGEN: Hepatitis B Surface Antigen: NEGATIVE

## 2012-06-21 ENCOUNTER — Telehealth: Payer: Self-pay | Admitting: *Deleted

## 2012-06-21 DIAGNOSIS — E039 Hypothyroidism, unspecified: Secondary | ICD-10-CM

## 2012-06-21 NOTE — Telephone Encounter (Signed)
Patient will be coming here for TSH and Free T4. She has appt with you on Friday. Could you please enter those labs for her and anything else you want her to have? (She won't be fasting) Thanks!

## 2012-06-22 ENCOUNTER — Other Ambulatory Visit (INDEPENDENT_AMBULATORY_CARE_PROVIDER_SITE_OTHER): Payer: Medicare Other

## 2012-06-22 DIAGNOSIS — E039 Hypothyroidism, unspecified: Secondary | ICD-10-CM | POA: Diagnosis not present

## 2012-06-22 NOTE — Telephone Encounter (Signed)
Records requested and will be faxed per Lib at [facility]

## 2012-06-22 NOTE — Telephone Encounter (Signed)
I see this has already been done.  Can we try and get rest of blood work she had done at [facility] for appt on Friday?  Thanks.

## 2012-06-24 ENCOUNTER — Ambulatory Visit (INDEPENDENT_AMBULATORY_CARE_PROVIDER_SITE_OTHER): Payer: Medicare Other | Admitting: Family Medicine

## 2012-06-24 ENCOUNTER — Encounter: Payer: Self-pay | Admitting: Family Medicine

## 2012-06-24 VITALS — BP 144/70 | HR 84 | Temp 98.1°F | Wt 138.2 lb

## 2012-06-24 DIAGNOSIS — M899 Disorder of bone, unspecified: Secondary | ICD-10-CM | POA: Diagnosis not present

## 2012-06-24 DIAGNOSIS — Z853 Personal history of malignant neoplasm of breast: Secondary | ICD-10-CM | POA: Diagnosis not present

## 2012-06-24 DIAGNOSIS — M949 Disorder of cartilage, unspecified: Secondary | ICD-10-CM

## 2012-06-24 DIAGNOSIS — Z01818 Encounter for other preprocedural examination: Secondary | ICD-10-CM

## 2012-06-24 DIAGNOSIS — M1710 Unilateral primary osteoarthritis, unspecified knee: Secondary | ICD-10-CM

## 2012-06-24 DIAGNOSIS — E039 Hypothyroidism, unspecified: Secondary | ICD-10-CM

## 2012-06-24 DIAGNOSIS — M1711 Unilateral primary osteoarthritis, right knee: Secondary | ICD-10-CM

## 2012-06-24 NOTE — Progress Notes (Signed)
Subjective:    Patient ID: Joanna White, female    DOB: 28-Mar-1941, 71 y.o.   MRN: [id number]  HPI CC:  Surgical clearance  Here for evaluation and clearance for left knee replacement, h/o significant arthritis with bone spurs.  This causes significant pain.  Dr. Chryl Heck is ortho.  Multiple surgeries in past (C section x2, thumb surgeries) without any trouble with anesthesia, but has never had GETA.  H/o osteoporosis - last DEXA 08/2011 consistent with osteopenia.  On boniva for 5 years, now off bisphosphonates. Continues calcium/vit D daily. H/o breast cancer s/p lumpectomy in 1998.  Sees Dr. Myna Hidalgo yearly, he follows her hypothyroidism. H/o hypothyroidism, compliant with levothyroxine and tolerating well. Lab Results  Component Value Date   TSH 0.51 06/22/2012   No h/o lung or cardiac issues. Nonsmoker.  BP slightly elevated today.  Usually runs 120s at home systolic. 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  Past histories reviewed and updated if relevant as below. Patient Active Problem List  Diagnosis  . HYPOTHYROIDISM  . SINUSITIS- ACUTE-NOS  . OSTEOPENIA  . BREAST CANCER, HX OF  . PNEUMONIA, HX OF  . Right leg pain   Past Medical History  Diagnosis Date  . Personal history of malignant neoplasm of breast 1998    Left  . Hypothyroid   . Osteopenia 08/2011    h/o osteoporosis, s/p boniva for 5 yrs  . Glaucoma    Past Surgical History  Procedure Date  . Cesarean section     x 2  . Breast lumpectomy 1998    Left  . Lymphnode  removal 1998    left axilla  . Gyn surgery     uterine lining removed for heavy bleeding and polyp  . Thumb surgery     bilateral  . Dexa 08/2011    osteopenia, T -1.9, rec rpt 2 yrs   History  Substance Use Topics  . Smoking status: Never Smoker   . Smokeless tobacco: Never Used  . Alcohol Use: No   Family History  Problem Relation Age of Onset  . Diabetes Brother     x2  . Cancer Father 61    lung  . Cancer Mother 97    MM  . Coronary artery disease Neg Hx   . Stroke Neg Hx    Allergies  Allergen Reactions  . Fluticasone Propionate     REACTION: throat tight   Current Outpatient Prescriptions on File Prior to Visit  Medication Sig Dispense Refill  . aspirin 81 MG tablet Take 81 mg by mouth daily.        . Calcium Carb-Cholecalciferol (CALCIUM-VITAMIN D) 600-400 MG-UNIT TABS Take 2 tablets by mouth daily.      Marland Kitchen levothyroxine (SYNTHROID, LEVOTHROID) 75 MCG tablet Take 75 mcg by mouth daily.        . Multiple Vitamin (MULTIVITAMIN) tablet Take 1 tablet by mouth daily.        . Travoprost, BAK Free, (TRAVATAMN) 0.004 % SOLN ophthalmic solution Place 1 drop into both eyes  at bedtime.          Review of Systems Per HPI    Objective:   Physical Exam  Nursing note and vitals reviewed. Constitutional: She appears well-developed and well-nourished. No distress.  HENT:  Head: Normocephalic and atraumatic.  Mouth/Throat: Oropharynx is clear and moist. No oropharyngeal exudate.  Eyes: Conjunctivae and EOM are normal. Pupils are equal, round, and reactive to light. No scleral icterus.  Neck: Normal range of motion. Neck supple. Carotid bruit is not present. No thyromegaly present.  Cardiovascular: Normal rate, regular rhythm, normal heart sounds and intact distal pulses.   No murmur heard. Pulmonary/Chest: Effort normal and breath sounds normal. No respiratory distress. She has no wheezes. She has no rales.  Musculoskeletal: She exhibits no edema.  Lymphadenopathy:     She has no cervical adenopathy.  Skin: Skin is warm and dry. No rash noted.  Psychiatric: She has a normal mood and affect.       Assessment & Plan:

## 2012-06-24 NOTE — Patient Instructions (Signed)
Good to see you today, call us with questions. Thyroid function very stable. Overall you are very healthy, I think you should do well with surgery. Good luck! Call us with any quesitons.

## 2012-06-26 ENCOUNTER — Encounter: Payer: Self-pay | Admitting: Family Medicine

## 2012-06-26 DIAGNOSIS — Z01818 Encounter for other preprocedural examination: Secondary | ICD-10-CM | POA: Insufficient documentation

## 2012-06-26 NOTE — Assessment & Plan Note (Signed)
Pending total knee replacement.

## 2012-06-26 NOTE — Assessment & Plan Note (Signed)
Continue cal/vit D 600/400 supplements bid.

## 2012-06-26 NOTE — Assessment & Plan Note (Signed)
TSH in normal range. Lab Results  Component Value Date   TSH 0.51 06/22/2012

## 2012-06-26 NOTE — Assessment & Plan Note (Addendum)
Here for preop clearance for TKR of right knee, indication arthritis and pain. Reviewed extensive recent blood work done at [facility] as well as EKG, CXR and blood work from here (TSH, free T4), all overall normal. Pt is low risk for surgical complications from this intermediate risk procedure. Has tolerated anesthesia in past but never undergone GETA to her knowledge.  rec routine post op care. On aspirin 81mg  daily for primary prevention given family history.  Given low risk, consider d/c 7d prior to surgery and restarting after xarelto course. Appreciate ortho care of patient.

## 2012-06-26 NOTE — Assessment & Plan Note (Addendum)
Very stable from this standpoint.  Told could be released from onc care, but feels more comfortable following with them yearly.

## 2012-07-01 DIAGNOSIS — Z01812 Encounter for preprocedural laboratory examination: Secondary | ICD-10-CM | POA: Diagnosis not present

## 2012-07-01 DIAGNOSIS — M1710 Unilateral primary osteoarthritis, unspecified knee: Secondary | ICD-10-CM | POA: Diagnosis not present

## 2012-07-01 DIAGNOSIS — Z79899 Other long term (current) drug therapy: Secondary | ICD-10-CM | POA: Diagnosis not present

## 2012-07-04 ENCOUNTER — Telehealth: Payer: Self-pay | Admitting: *Deleted

## 2012-07-04 MED ORDER — SULFAMETHOXAZOLE-TRIMETHOPRIM 800-160 MG PO TABS: 1.0000 | ORAL_TABLET | Freq: Two times a day (BID) | ORAL | Status: DC

## 2012-07-04 NOTE — Telephone Encounter (Signed)
plz notify pt rpt urine returned suspicious for infection - would like her to take bactrim 1 po bid x 3 days.  Sent into pharmacy.  To return in 1 wk for retesting.  If abnormal UA, send off culture.  If normal, fax back to [facility] (contact info placed in Kim's box)  UA - tr LE, 2-5 WBC, few bact UCx - 25-50k pansensitive E coli

## 2012-07-04 NOTE — Telephone Encounter (Signed)
Patient notified. Nurse visit scheduled for UA.

## 2012-07-04 NOTE — Telephone Encounter (Signed)
Received [place] and C/S from [facility]. They are wanting you to treat infection prior to surgery on 07/19/12. Report in your IN box. Please advise.

## 2012-07-07 ENCOUNTER — Telehealth: Payer: Self-pay | Admitting: Family Medicine

## 2012-07-07 NOTE — Telephone Encounter (Signed)
plz schedule her for appt tomorrow.

## 2012-07-07 NOTE — Telephone Encounter (Signed)
Caller: Joanna White/Patient; PCP: Eustaquio Boyden; CB#: [phone]; Seen for preop AT HOSPITAL- 07/01/12 and started on Septra for UTI on 07/04/12-she took for 3 days. She is having knee replacement surgery on Jul 19, 2012 and is concerned about sinus sx onset-  07/06/12.  She Has Pain and Pressure On Left Side of Face, and slight headache on L side behind L eye. She is using [employer]Star with some relief. She was last treated for sinus infection in Feb 2013.  She Asked To Have Her Usual Amoxicillin-Clavulanate Called Into [facility] [phone]; PLEASE CALL HER TO LET HER KNOW IF MED CALLED IN. She has f/u appnt to have urine rechecked on 07/12/12 but doesn't want to wait to start on med for sinus. No nasal discharge yet, no cough, Afebrile. Triage and Care advice per URI Protocol and appnt advised within 3 day. No appnt available on 8/8 or 07/08/12. Call her if she can be worked in or If med called in.

## 2012-07-07 NOTE — Telephone Encounter (Signed)
Appointment scheduled with Dr. Para March on 07-08-12.

## 2012-07-08 ENCOUNTER — Encounter: Payer: Self-pay | Admitting: Family Medicine

## 2012-07-08 ENCOUNTER — Ambulatory Visit (INDEPENDENT_AMBULATORY_CARE_PROVIDER_SITE_OTHER): Payer: Medicare Other | Admitting: Family Medicine

## 2012-07-08 VITALS — BP 122/76 | HR 91 | Temp 98.3°F | Wt 137.0 lb

## 2012-07-08 DIAGNOSIS — N390 Urinary tract infection, site not specified: Secondary | ICD-10-CM | POA: Diagnosis not present

## 2012-07-08 DIAGNOSIS — R8290 Unspecified abnormal findings in urine: Secondary | ICD-10-CM

## 2012-07-08 DIAGNOSIS — R82998 Other abnormal findings in urine: Secondary | ICD-10-CM | POA: Diagnosis not present

## 2012-07-08 DIAGNOSIS — J329 Chronic sinusitis, unspecified: Secondary | ICD-10-CM | POA: Diagnosis not present

## 2012-07-08 LAB — POCT URINALYSIS DIPSTICK
Blood, UA: NEGATIVE
Ketones, UA: NEGATIVE
Protein, UA: NEGATIVE
Spec Grav, UA: <=1.005

## 2012-07-08 MED ORDER — AMOXICILLIN-POT CLAVULANATE 875-125 MG PO TABS: 1.0000 | ORAL_TABLET | Freq: Two times a day (BID) | ORAL | Status: AC

## 2012-07-08 NOTE — Assessment & Plan Note (Signed)
Recheck u/a today.  If wnl, no f/u.  If not, then check ucx.  See notes on labs.

## 2012-07-08 NOTE — Progress Notes (Signed)
Plan for L TKR in near future.  Had abnormal urine tests prev and is due for repeat U/a per PCP.  No dysuria.  Is s/p 3 days of septra.  See notes on labs.   duration of symptoms: started 2 nights ago Rhinorrhea: no Congestion: yes, L sided ear pain: L sided ear/head pain sore throat: no cough:no myalgias:no other concerns: no fevers.   Using neti pot.    ROS: See HPI.  Otherwise negative.    Meds, vitals, and allergies reviewed.   GEN: nad, alert and oriented HEENT: mucous membranes moist, TM w/o erythema, nasal epithelium injected, OP with mild cobblestoning, L max sinus ttp, TMJs not ttp NECK: supple w/o LA CV: rrr. PULM: ctab, no inc wob EXT: no edema  U/a collected.

## 2012-07-08 NOTE — Patient Instructions (Signed)
Start the augmentin today and notify the surgeon about you status next week.  Take care.

## 2012-07-08 NOTE — Assessment & Plan Note (Signed)
Start augmentin and she'll notify the surgeon about her status next week.  She agrees.  Nontoxic.

## 2012-07-12 ENCOUNTER — Ambulatory Visit: Payer: Medicare Other

## 2012-07-19 DIAGNOSIS — Z79899 Other long term (current) drug therapy: Secondary | ICD-10-CM | POA: Diagnosis not present

## 2012-07-19 DIAGNOSIS — M1710 Unilateral primary osteoarthritis, unspecified knee: Secondary | ICD-10-CM | POA: Diagnosis not present

## 2012-07-19 DIAGNOSIS — Z7982 Long term (current) use of aspirin: Secondary | ICD-10-CM | POA: Diagnosis not present

## 2012-07-19 DIAGNOSIS — IMO0002 Reserved for concepts with insufficient information to code with codable children: Secondary | ICD-10-CM | POA: Diagnosis not present

## 2012-07-19 DIAGNOSIS — E039 Hypothyroidism, unspecified: Secondary | ICD-10-CM | POA: Diagnosis not present

## 2012-07-19 DIAGNOSIS — H409 Unspecified glaucoma: Secondary | ICD-10-CM | POA: Diagnosis not present

## 2012-07-19 DIAGNOSIS — Z853 Personal history of malignant neoplasm of breast: Secondary | ICD-10-CM | POA: Diagnosis not present

## 2012-07-22 DIAGNOSIS — Z96659 Presence of unspecified artificial knee joint: Secondary | ICD-10-CM | POA: Diagnosis not present

## 2012-07-22 DIAGNOSIS — R269 Unspecified abnormalities of gait and mobility: Secondary | ICD-10-CM | POA: Diagnosis not present

## 2012-07-22 DIAGNOSIS — Z471 Aftercare following joint replacement surgery: Secondary | ICD-10-CM | POA: Diagnosis not present

## 2012-07-23 DIAGNOSIS — Z471 Aftercare following joint replacement surgery: Secondary | ICD-10-CM | POA: Diagnosis not present

## 2012-07-23 DIAGNOSIS — R269 Unspecified abnormalities of gait and mobility: Secondary | ICD-10-CM | POA: Diagnosis not present

## 2012-07-23 DIAGNOSIS — Z96659 Presence of unspecified artificial knee joint: Secondary | ICD-10-CM | POA: Diagnosis not present

## 2012-07-24 DIAGNOSIS — Z96659 Presence of unspecified artificial knee joint: Secondary | ICD-10-CM | POA: Diagnosis not present

## 2012-07-24 DIAGNOSIS — R269 Unspecified abnormalities of gait and mobility: Secondary | ICD-10-CM | POA: Diagnosis not present

## 2012-07-24 DIAGNOSIS — Z471 Aftercare following joint replacement surgery: Secondary | ICD-10-CM | POA: Diagnosis not present

## 2012-07-25 DIAGNOSIS — Z471 Aftercare following joint replacement surgery: Secondary | ICD-10-CM | POA: Diagnosis not present

## 2012-07-25 DIAGNOSIS — Z96659 Presence of unspecified artificial knee joint: Secondary | ICD-10-CM | POA: Diagnosis not present

## 2012-07-25 DIAGNOSIS — R269 Unspecified abnormalities of gait and mobility: Secondary | ICD-10-CM | POA: Diagnosis not present

## 2012-07-27 DIAGNOSIS — Z471 Aftercare following joint replacement surgery: Secondary | ICD-10-CM | POA: Diagnosis not present

## 2012-07-27 DIAGNOSIS — R269 Unspecified abnormalities of gait and mobility: Secondary | ICD-10-CM | POA: Diagnosis not present

## 2012-07-27 DIAGNOSIS — Z96659 Presence of unspecified artificial knee joint: Secondary | ICD-10-CM | POA: Diagnosis not present

## 2012-07-28 ENCOUNTER — Telehealth: Payer: Self-pay | Admitting: Hematology & Oncology

## 2012-07-28 NOTE — Telephone Encounter (Signed)
Patient called and sch her yearly appt for 08/25/12

## 2012-07-29 DIAGNOSIS — R269 Unspecified abnormalities of gait and mobility: Secondary | ICD-10-CM | POA: Diagnosis not present

## 2012-07-29 DIAGNOSIS — Z96659 Presence of unspecified artificial knee joint: Secondary | ICD-10-CM | POA: Diagnosis not present

## 2012-07-29 DIAGNOSIS — Z471 Aftercare following joint replacement surgery: Secondary | ICD-10-CM | POA: Diagnosis not present

## 2012-08-02 DIAGNOSIS — M25569 Pain in unspecified knee: Secondary | ICD-10-CM | POA: Diagnosis not present

## 2012-08-05 DIAGNOSIS — M25569 Pain in unspecified knee: Secondary | ICD-10-CM | POA: Diagnosis not present

## 2012-08-10 DIAGNOSIS — M25569 Pain in unspecified knee: Secondary | ICD-10-CM | POA: Diagnosis not present

## 2012-08-11 ENCOUNTER — Other Ambulatory Visit: Payer: Self-pay | Admitting: *Deleted

## 2012-08-11 DIAGNOSIS — E039 Hypothyroidism, unspecified: Secondary | ICD-10-CM

## 2012-08-11 MED ORDER — LEVOTHYROXINE SODIUM 75 MCG PO TABS: 75.0000 ug | ORAL_TABLET | Freq: Every day | ORAL | Status: DC

## 2012-08-12 DIAGNOSIS — M25569 Pain in unspecified knee: Secondary | ICD-10-CM | POA: Diagnosis not present

## 2012-08-15 DIAGNOSIS — M25569 Pain in unspecified knee: Secondary | ICD-10-CM | POA: Diagnosis not present

## 2012-08-17 ENCOUNTER — Other Ambulatory Visit: Payer: Self-pay | Admitting: *Deleted

## 2012-08-17 DIAGNOSIS — E039 Hypothyroidism, unspecified: Secondary | ICD-10-CM

## 2012-08-17 MED ORDER — LEVOTHYROXINE SODIUM 75 MCG PO TABS: 75.0000 ug | ORAL_TABLET | Freq: Every day | ORAL | Status: DC

## 2012-08-18 DIAGNOSIS — M25569 Pain in unspecified knee: Secondary | ICD-10-CM | POA: Diagnosis not present

## 2012-08-23 DIAGNOSIS — M25569 Pain in unspecified knee: Secondary | ICD-10-CM | POA: Diagnosis not present

## 2012-08-25 ENCOUNTER — Other Ambulatory Visit (HOSPITAL_BASED_OUTPATIENT_CLINIC_OR_DEPARTMENT_OTHER): Payer: Medicare Other | Admitting: Lab

## 2012-08-25 ENCOUNTER — Ambulatory Visit (HOSPITAL_BASED_OUTPATIENT_CLINIC_OR_DEPARTMENT_OTHER): Payer: Medicare Other | Admitting: Hematology & Oncology

## 2012-08-25 VITALS — BP 146/67 | HR 82 | Temp 98.1°F | Resp 18 | Ht 61.0 in | Wt 137.0 lb

## 2012-08-25 DIAGNOSIS — Z853 Personal history of malignant neoplasm of breast: Secondary | ICD-10-CM

## 2012-08-25 DIAGNOSIS — M899 Disorder of bone, unspecified: Secondary | ICD-10-CM

## 2012-08-25 DIAGNOSIS — E039 Hypothyroidism, unspecified: Secondary | ICD-10-CM

## 2012-08-25 DIAGNOSIS — M949 Disorder of cartilage, unspecified: Secondary | ICD-10-CM | POA: Diagnosis not present

## 2012-08-25 DIAGNOSIS — E559 Vitamin D deficiency, unspecified: Secondary | ICD-10-CM | POA: Diagnosis not present

## 2012-08-25 LAB — CBC WITH DIFFERENTIAL (CANCER CENTER ONLY)
BASO#: 0 10*3/uL (ref 0.0–0.2)
EOS%: 2.5 % (ref 0.0–7.0)
HCT: 40.8 % (ref 34.8–46.6)
HGB: 13.8 g/dL (ref 11.6–15.9)
LYMPH#: 1.7 10*3/uL (ref 0.9–3.3)
MCH: 30.3 pg (ref 26.0–34.0)
MONO#: 0.9 10*3/uL (ref 0.1–0.9)
MONO%: 10.7 % (ref 0.0–13.0)
NEUT#: 5.9 10*3/uL (ref 1.5–6.5)
NEUT%: 66.9 % (ref 39.6–80.0)
RBC: 4.56 10*6/uL (ref 3.70–5.32)
WBC: 8.8 10*3/uL (ref 3.9–10.0)

## 2012-08-25 NOTE — Patient Instructions (Signed)
Call if problems 

## 2012-08-25 NOTE — Progress Notes (Signed)
This office note has been dictated.

## 2012-08-26 DIAGNOSIS — M25569 Pain in unspecified knee: Secondary | ICD-10-CM | POA: Diagnosis not present

## 2012-08-26 LAB — COMPREHENSIVE METABOLIC PANEL
BUN: 19 mg/dL (ref 6–23)
CO2: 28 mEq/L (ref 19–32)
Calcium: 10.1 mg/dL (ref 8.4–10.5)
Creatinine, Ser: 0.76 mg/dL (ref 0.50–1.10)
Glucose, Bld: 89 mg/dL (ref 70–99)
Total Bilirubin: 0.6 mg/dL (ref 0.3–1.2)

## 2012-08-26 LAB — VITAMIN D 25 HYDROXY (VIT D DEFICIENCY, FRACTURES): Vit D, 25-Hydroxy: 42 ng/mL (ref 30–89)

## 2012-08-26 LAB — TSH: TSH: 1.526 u[IU]/mL (ref 0.350–4.500)

## 2012-08-26 NOTE — Progress Notes (Signed)
DIAGNOSES: 1. 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 2. Hypothyroidism.  CURRENT THERAPY:  Synthroid 0.075 mg p.o. daily.  INTERIM HISTORY:  Ms. Mitnick comes in for followup.  She is doing okay.  She had left knee surgery back on August 22nd.  She had a knee replacement.  This was done at [facility].  She actually has done pretty well with this.  She is getting some physical therapy for this. She got through surgery without any problems.  Otherwise, she has had no problems since we last saw her last year.  We see her every year.  She had a TSH level done back in July.  This was 0.51, which was a good.  She has had no cough or shortness of breath.  She has had no nausea or vomiting.  There has been no bony pain, outside of that with the left knee. There has been no change in bowel or bladder habits.  PHYSICAL EXAMINATION:  This is a well-developed, well-nourished white female in no obvious distress.  Vital signs: 98.1, pulse 82, respiratory rate 18, blood pressure 146/67.  Weight is 137.  Head and neck: Normocephalic, atraumatic skull.  There are no ocular or oral lesions. There are no palpable cervical or supraclavicular lymph nodes.  Lungs: Clear bilaterally.  Cardiac:  Regular rate and rhythm with a normal S1 and S2.  There are no murmurs, rubs or bruits.  Breasts:  Right breast with no masses, edema or erythema.  There is no right axillary adenopathy.  Left breast is slightly contracted from surgery and radiation.  She has a well-healed lumpectomy at the 7 o'clock position. There is no distinct mass in the left breast.  There is no left axillary adenopathy.  Abdomen:  Soft with good bowel sounds.  There is no palpable abdominal mass.  There is no palpable hepatosplenomegaly. Back:  No tenderness over the spine, ribs, or hips.  There is no kyphosis or osteoporotic changes.  Extremities:  Shows the bandage on the left knee.  There is no swelling of the  left leg.  There is no swelling of the right leg.  Neurological:  No focal neurological deficits.  LABORATORY STUDIES:  White cell count is 8.8, hemoglobin 14, hematocrit 41, platelet count is 224.  IMPRESSION:  Ms. Mcvey is a nice 71 year old white female with history of stage IIA, node negative, ductal carcinoma of the left breast.  She was diagnosed back in 1998.  She underwent lumpectomy followed by adjuvant chemotherapy with CMF. Her tumor was ER negative. She did receive radiation therapy afterwards.  I do not see any problem with recurrent disease.  I suspect that she is probably cured.  She still likes to come back to see [place].  We will get her back in 1 year.    ______________________________ Josph Macho, M.D. PRE/MEDQ  D:  08/25/2012  T:  08/26/2012  Job:  [id number]

## 2012-08-29 ENCOUNTER — Telehealth: Payer: Self-pay | Admitting: *Deleted

## 2012-08-29 NOTE — Telephone Encounter (Signed)
Called patient to let her know that her labwork is good per dr. Myna Hidalgo.   Left message on patient personal answering machine.

## 2012-08-29 NOTE — Telephone Encounter (Signed)
Message copied by Anselm Jungling on Mon Aug 29, 2012 10:40 AM ------      Message from: Joanna White      Created: Fri Aug 26, 2012  7:17 AM       Call - labs and vit D and thyroid are all ok!!!  Cindee Lame

## 2012-08-30 DIAGNOSIS — M25569 Pain in unspecified knee: Secondary | ICD-10-CM | POA: Diagnosis not present

## 2012-09-02 DIAGNOSIS — M1710 Unilateral primary osteoarthritis, unspecified knee: Secondary | ICD-10-CM | POA: Diagnosis not present

## 2012-09-02 DIAGNOSIS — M25569 Pain in unspecified knee: Secondary | ICD-10-CM | POA: Diagnosis not present

## 2012-09-05 DIAGNOSIS — H409 Unspecified glaucoma: Secondary | ICD-10-CM | POA: Diagnosis not present

## 2012-09-05 DIAGNOSIS — H4011X0 Primary open-angle glaucoma, stage unspecified: Secondary | ICD-10-CM | POA: Diagnosis not present

## 2012-09-05 DIAGNOSIS — Z961 Presence of intraocular lens: Secondary | ICD-10-CM | POA: Diagnosis not present

## 2012-09-06 DIAGNOSIS — M25569 Pain in unspecified knee: Secondary | ICD-10-CM | POA: Diagnosis not present

## 2012-09-08 DIAGNOSIS — M25569 Pain in unspecified knee: Secondary | ICD-10-CM | POA: Diagnosis not present

## 2012-09-13 DIAGNOSIS — M25569 Pain in unspecified knee: Secondary | ICD-10-CM | POA: Diagnosis not present

## 2012-10-11 DIAGNOSIS — Z1231 Encounter for screening mammogram for malignant neoplasm of breast: Secondary | ICD-10-CM | POA: Diagnosis not present

## 2012-10-11 DIAGNOSIS — Z803 Family history of malignant neoplasm of breast: Secondary | ICD-10-CM | POA: Diagnosis not present

## 2012-11-02 DIAGNOSIS — Z23 Encounter for immunization: Secondary | ICD-10-CM | POA: Diagnosis not present

## 2012-11-18 DIAGNOSIS — Z96659 Presence of unspecified artificial knee joint: Secondary | ICD-10-CM | POA: Diagnosis not present

## 2012-12-06 DIAGNOSIS — M1710 Unilateral primary osteoarthritis, unspecified knee: Secondary | ICD-10-CM | POA: Diagnosis not present

## 2012-12-22 DIAGNOSIS — H4011X0 Primary open-angle glaucoma, stage unspecified: Secondary | ICD-10-CM | POA: Diagnosis not present

## 2012-12-22 DIAGNOSIS — H409 Unspecified glaucoma: Secondary | ICD-10-CM | POA: Diagnosis not present

## 2013-04-03 DIAGNOSIS — H4011X0 Primary open-angle glaucoma, stage unspecified: Secondary | ICD-10-CM | POA: Diagnosis not present

## 2013-04-03 DIAGNOSIS — H409 Unspecified glaucoma: Secondary | ICD-10-CM | POA: Diagnosis not present

## 2013-04-18 DIAGNOSIS — D235 Other benign neoplasm of skin of trunk: Secondary | ICD-10-CM | POA: Diagnosis not present

## 2013-04-18 DIAGNOSIS — L570 Actinic keratosis: Secondary | ICD-10-CM | POA: Diagnosis not present

## 2013-04-18 DIAGNOSIS — L259 Unspecified contact dermatitis, unspecified cause: Secondary | ICD-10-CM | POA: Diagnosis not present

## 2013-07-10 DIAGNOSIS — Z961 Presence of intraocular lens: Secondary | ICD-10-CM | POA: Diagnosis not present

## 2013-07-10 DIAGNOSIS — H409 Unspecified glaucoma: Secondary | ICD-10-CM | POA: Diagnosis not present

## 2013-07-10 DIAGNOSIS — H4011X0 Primary open-angle glaucoma, stage unspecified: Secondary | ICD-10-CM | POA: Diagnosis not present

## 2013-08-02 ENCOUNTER — Telehealth: Payer: Self-pay | Admitting: Hematology & Oncology

## 2013-08-02 DIAGNOSIS — E039 Hypothyroidism, unspecified: Secondary | ICD-10-CM

## 2013-08-02 MED ORDER — LEVOTHYROXINE SODIUM 75 MCG PO TABS: 75.0000 ug | ORAL_TABLET | Freq: Every day | ORAL | Status: DC

## 2013-08-02 NOTE — Telephone Encounter (Signed)
Pt called made 08-30-13 appointment and I transferred call to RN for medicine refill

## 2013-08-02 NOTE — Telephone Encounter (Signed)
Pt called stating she was getting ready to call her pharmacy to refill her Synthroid but noticed she didn't have any refills on the bottle. She had Raiford Noble make her a yearly appt with Dr Myna Hidalgo and was provided a 90 day supply of medication via e-rx to get her through until she is seen to have her thyroid rechecked.

## 2013-08-08 ENCOUNTER — Telehealth: Payer: Self-pay | Admitting: Hematology & Oncology

## 2013-08-08 NOTE — Telephone Encounter (Signed)
Patient called and cx 08/31/03 and resch for 09/14/13

## 2013-08-29 ENCOUNTER — Ambulatory Visit (INDEPENDENT_AMBULATORY_CARE_PROVIDER_SITE_OTHER): Payer: Medicare Other | Admitting: Internal Medicine

## 2013-08-29 ENCOUNTER — Encounter: Payer: Self-pay | Admitting: Internal Medicine

## 2013-08-29 VITALS — BP 144/92 | HR 108 | Temp 98.4°F | Ht 61.0 in | Wt 137.1 lb

## 2013-08-29 DIAGNOSIS — J019 Acute sinusitis, unspecified: Secondary | ICD-10-CM | POA: Diagnosis not present

## 2013-08-29 MED ORDER — AMOXICILLIN-POT CLAVULANATE 875-125 MG PO TABS: 1.0000 | ORAL_TABLET | Freq: Two times a day (BID) | ORAL | Status: DC

## 2013-08-29 NOTE — Patient Instructions (Signed)

## 2013-08-29 NOTE — Progress Notes (Signed)
HPI  Pt presents to the clinic with c/o right ear pain, left side headache, facial pressure, nasal congestion and cough. This started 2 days ago. She denies fever, chills or body aches. She has taken tylenol cold and sinus without much improvement. She denies history of allergies or breathing problems. She has not had sick contacts that she is aware of. She does have a history of pansinusitis on CT in 2005. She received extended treatment at that time with improvement but has been having more recent sinus infections over the last year. Her last sinus infection was 06/2012, treated with Augmentin.  Review of Systems    Past Medical History  Diagnosis Date  . Personal history of malignant neoplasm of breast 1998    Left  . Hypothyroid   . Osteopenia 08/2011    h/o osteoporosis, s/p boniva for 5 yrs  . Glaucoma     Family History  Problem Relation Age of Onset  . Diabetes Brother     x2  . Cancer Father 63    lung  . Cancer Mother 61    MM  . Coronary artery disease Brother 58    CABG  . Stroke Neg Hx     History   Social History  . Marital Status: Widowed    Spouse Name: N/A    Number of Children: 2  . Years of Education: N/A   Occupational History  . Retired    Social History Main Topics  . Smoking status: Never Smoker   . Smokeless tobacco: Never Used  . Alcohol Use: No  . Drug Use: No  . Sexual Activity: Not on file   Other Topics Concern  . 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 Fluticasone Propionate     REACTION: throat tight     Constitutional: Positive headache, fatigue. Denies fever or abrupt weight changes.  HEENT:  Positive eye pain, pressure behind the eyes, facial pain, nasal congestion and sore throat. Denies eye  redness, ear pain, ringing in the ears, wax buildup, runny nose or bloody nose. Respiratory: Positive cough and thick yellow sputum production. Denies difficulty breathing or shortness of breath.  Cardiovascular: Denies chest pain, chest tightness, palpitations or swelling in the hands or feet.   No other specific complaints in a complete review of systems (except as listed in HPI above).  Objective:    Pulse 108  Temp(Src) 98.4 F (36.9 C) (Oral)  Ht 5\' 1"  (1.549 m)  Wt 137 lb 1.9 oz (62.197 kg)  BMI 25.92 kg/m2  SpO2 97% Wt Readings from Last 3 Encounters:  08/29/13 137 lb 1.9 oz (62.197 kg)  08/25/12 137 lb (62.143 kg)  07/08/12 137 lb (62.143 kg)    General: Appears her stated age, well developed, well nourished in NAD. HEENT: Head: normal shape and size, left frontal and maxillary sinus tenderness; Eyes: sclera white, no icterus, conjunctiva pink, PERRLA and EOMs intact; Ears: Tm's gray and intact, normal light reflex; Nose: mucosa pink and moist, septum midline; Throat/Mouth: + PND. Teeth present, mucosa pink and moist, no exudate noted, no lesions or ulcerations noted.  Neck:  Neck supple, trachea midline. No massses, lumps or thyromegaly present.  Cardiovascular: Normal rate and rhythm. S1,S2 noted.  No murmur, rubs or gallops noted. No JVD or BLE edema. No carotid bruits noted. Pulmonary/Chest: Normal effort and positive vesicular breath sounds. No respiratory distress. No wheezes, rales or ronchi noted.      Assessment & Plan:   Acute bacterial sinusitis  Can use a Neti Pot which can be purchased from your local drug store. Ibuprofen for pain/fever Consider adding an OTC zyrtec daily. Given history of pansinusitis will treat with Augmentin BID for 10 days-eRx provided Eat yogurt to avoid yeast infection  RTC as needed or if symptoms persist.

## 2013-08-30 ENCOUNTER — Other Ambulatory Visit: Payer: Medicare Other | Admitting: Lab

## 2013-08-30 ENCOUNTER — Ambulatory Visit: Payer: Medicare Other | Admitting: Hematology & Oncology

## 2013-09-14 ENCOUNTER — Other Ambulatory Visit (HOSPITAL_BASED_OUTPATIENT_CLINIC_OR_DEPARTMENT_OTHER): Payer: Medicare Other | Admitting: Lab

## 2013-09-14 ENCOUNTER — Ambulatory Visit (HOSPITAL_BASED_OUTPATIENT_CLINIC_OR_DEPARTMENT_OTHER): Payer: Medicare Other | Admitting: Hematology & Oncology

## 2013-09-14 ENCOUNTER — Ambulatory Visit (HOSPITAL_BASED_OUTPATIENT_CLINIC_OR_DEPARTMENT_OTHER): Payer: Medicare Other

## 2013-09-14 VITALS — BP 143/69 | HR 90 | Temp 97.9°F | Resp 14 | Ht 61.0 in | Wt 139.0 lb

## 2013-09-14 DIAGNOSIS — Z23 Encounter for immunization: Secondary | ICD-10-CM | POA: Diagnosis not present

## 2013-09-14 DIAGNOSIS — E039 Hypothyroidism, unspecified: Secondary | ICD-10-CM | POA: Diagnosis not present

## 2013-09-14 DIAGNOSIS — Z853 Personal history of malignant neoplasm of breast: Secondary | ICD-10-CM

## 2013-09-14 LAB — CBC WITH DIFFERENTIAL (CANCER CENTER ONLY)
BASO%: 0.5 % (ref 0.0–2.0)
EOS%: 3.7 % (ref 0.0–7.0)
HCT: 44.1 % (ref 34.8–46.6)
LYMPH#: 1.9 10*3/uL (ref 0.9–3.3)
MCH: 29.5 pg (ref 26.0–34.0)
MCHC: 33.3 g/dL (ref 32.0–36.0)
MCV: 89 fL (ref 81–101)
MONO%: 12.8 % (ref 0.0–13.0)
NEUT#: 4.6 10*3/uL (ref 1.5–6.5)
NEUT%: 59.3 % (ref 39.6–80.0)
RBC: 4.98 10*6/uL (ref 3.70–5.32)
RDW: 13.5 % (ref 11.1–15.7)

## 2013-09-14 LAB — COMPREHENSIVE METABOLIC PANEL
ALT: 18 U/L (ref 0–35)
AST: 20 U/L (ref 0–37)
Albumin: 4.2 g/dL (ref 3.5–5.2)
Alkaline Phosphatase: 60 U/L (ref 39–117)
BUN: 17 mg/dL (ref 6–23)
Calcium: 9.6 mg/dL (ref 8.4–10.5)
Chloride: 105 mEq/L (ref 96–112)
Creatinine, Ser: 0.93 mg/dL (ref 0.50–1.10)
Potassium: 4.2 mEq/L (ref 3.5–5.3)
Total Protein: 7.3 g/dL (ref 6.0–8.3)

## 2013-09-14 MED ORDER — INFLUENZA VAC SPLIT QUAD 0.5 ML IM SUSP
0.5000 mL | Freq: Once | INTRAMUSCULAR | Status: AC
  Administered 2013-09-14: 0.5 mL via INTRAMUSCULAR
  Filled 2013-09-14: qty 0.5

## 2013-09-14 NOTE — Progress Notes (Signed)
This office note has been dictated.

## 2013-09-15 ENCOUNTER — Telehealth: Payer: Self-pay | Admitting: Nurse Practitioner

## 2013-09-15 NOTE — Progress Notes (Signed)
DIAGNOSIS: 1. 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 2. Hypothyroidism.  CURRENT THERAPY:  Synthroid 0.075 mg p.o. daily.  INTERIM HISTORY:  Joanna White comes in for her yearly followup.  She is doing well.  She has recovered from her left knee surgery.  This was back August of 2013.  She had a good trip this summer.  She went to [place] 6 [place].  This was a physical feet which she did.  She has had no problems with cough or shortness of breath.  She has had no issues with her thyroid.  We have been checking her thyroid levels yearly.  She has had no change in bowel or bladder habits.  She has had no nausea or vomiting.  There has been no leg swelling. There has been no headache or blurred vision.  PHYSICAL EXAMINATION:  General:  This is a well-developed, well- nourished white female in no obvious distress.  Vital signs: Temperature of 97.9, pulse 90, respiratory rate 14, blood pressure 143/69,  weight is 139 pounds.  Head and neck:  Normocephalic, atraumatic skull.  There are no ocular or oral lesions.  There are no palpable cervical or supraclavicular lymph nodes.  Lungs:  Clear bilaterally.  Cardiac:  Regular rate and rhythm with a normal S1 and S2. There are no murmurs, rubs or bruits.  Abdomen:  Soft.  She has good bowel sounds.  There is no fluid wave.  There is no palpable abdominal mass.  There is no palpable hepatosplenomegaly.  Breasts:  Shows right breast with no masses, edema or erythema.  There is no right axillary adenopathy.  Left breast shows well-healed lumpectomy at the 7 o'clock position.  There is some slight firmness at the lumpectomy site.  No distinct mass noted in the left breast.  There is no left axillary adenopathy.  Back:  Shows no kyphosis or osteoporotic changes.  No tenderness is noted over the spine, ribs, or hips.  Extremities:  Show no clubbing, cyanosis or edema.  Neurological:  Shows no focal neurological deficits.   Skin:  No rashes, ecchymosis or petechia.  LABORATORY STUDIES:  White cell count is 7.8, hemoglobin 14.7, hematocrit 44.1, platelet count 257.  IMPRESSION:  Joanna White is a 72 year old white female with history of stage IIA ductal carcinoma of the left breast.  She had adjuvant chemotherapy with CMF.  The tumor was ER negative.  Again, this is now 16 years out.  She has done very well.  I do not see any issues that would suggest recurrent disease.  We will plan to get her back in 1 more year.  We still follow her parathyroid and adjust her Synthroid dose.    ______________________________ Josph Macho, M.D. PRE/MEDQ  D:  09/14/2013  T:  09/15/2013  Job:  [id number]

## 2013-09-15 NOTE — Telephone Encounter (Addendum)
Message copied by Glee Arvin on Fri Sep 15, 2013  4:20 PM ------      Message from: Arlan Organ R      Created: Thu Sep 14, 2013  5:49 PM       call - labs and thyroid are stable!!   No change in the Synthroid.  Pete ------LVM informing pt about the above message and encouraged her to contact the office if she had any additional questions.

## 2013-10-16 DIAGNOSIS — H4011X0 Primary open-angle glaucoma, stage unspecified: Secondary | ICD-10-CM | POA: Diagnosis not present

## 2013-10-16 DIAGNOSIS — H409 Unspecified glaucoma: Secondary | ICD-10-CM | POA: Diagnosis not present

## 2013-10-17 DIAGNOSIS — Z1231 Encounter for screening mammogram for malignant neoplasm of breast: Secondary | ICD-10-CM | POA: Diagnosis not present

## 2013-10-17 DIAGNOSIS — M899 Disorder of bone, unspecified: Secondary | ICD-10-CM | POA: Diagnosis not present

## 2013-10-20 ENCOUNTER — Telehealth: Payer: Self-pay

## 2013-10-20 NOTE — Telephone Encounter (Signed)
Pt said was seen 08/29/13 for sinus infection; pt said this happens every 2-3 months; now pt has h/a on left side of forehead with pressure,head congestion on and off but neti pot helps;no cough, no S/T, no fever.pt does not want to come for appt because pt said this happens all the time and request refill of augmentin to [facility]. Advised pt does not usually get antibiotic refill without being seen but pt asked me to send request and request cb.Please advise.

## 2013-10-20 NOTE — Telephone Encounter (Signed)
Will have to be seen first

## 2013-10-23 ENCOUNTER — Encounter: Payer: Self-pay | Admitting: Hematology & Oncology

## 2013-10-23 NOTE — Telephone Encounter (Signed)
Left v/m for pt to cb. 

## 2013-10-23 NOTE — Telephone Encounter (Signed)
Pt called back and Lyla Son notified pt she needs to be seen and pt scheduled appt 10/24/13.

## 2013-10-24 ENCOUNTER — Ambulatory Visit: Payer: Medicare Other | Admitting: Family Medicine

## 2013-10-24 DIAGNOSIS — R928 Other abnormal and inconclusive findings on diagnostic imaging of breast: Secondary | ICD-10-CM | POA: Diagnosis not present

## 2013-10-25 ENCOUNTER — Other Ambulatory Visit: Payer: Self-pay | Admitting: Hematology & Oncology

## 2013-10-31 ENCOUNTER — Encounter: Payer: Self-pay | Admitting: Nurse Practitioner

## 2014-01-15 DIAGNOSIS — Z961 Presence of intraocular lens: Secondary | ICD-10-CM | POA: Diagnosis not present

## 2014-01-15 DIAGNOSIS — H4011X0 Primary open-angle glaucoma, stage unspecified: Secondary | ICD-10-CM | POA: Diagnosis not present

## 2014-01-15 DIAGNOSIS — H409 Unspecified glaucoma: Secondary | ICD-10-CM | POA: Diagnosis not present

## 2014-01-29 ENCOUNTER — Other Ambulatory Visit: Payer: Self-pay | Admitting: Hematology & Oncology

## 2014-02-13 ENCOUNTER — Ambulatory Visit (INDEPENDENT_AMBULATORY_CARE_PROVIDER_SITE_OTHER): Payer: Medicare Other | Admitting: Family Medicine

## 2014-02-13 ENCOUNTER — Encounter: Payer: Self-pay | Admitting: Family Medicine

## 2014-02-13 VITALS — BP 136/82 | HR 80 | Temp 98.1°F | Wt 144.0 lb

## 2014-02-13 DIAGNOSIS — J329 Chronic sinusitis, unspecified: Secondary | ICD-10-CM

## 2014-02-13 MED ORDER — AMOXICILLIN-POT CLAVULANATE 875-125 MG PO TABS: 1.0000 | ORAL_TABLET | Freq: Two times a day (BID) | ORAL | Status: AC

## 2014-02-13 NOTE — Assessment & Plan Note (Signed)
Given duration and progression of sxs, will treat for bacterial acute sinusitis with augmentin course. Supportive care as per instructions.

## 2014-02-13 NOTE — Progress Notes (Signed)
Pre visit review using our clinic review tool, if applicable. No additional management support is needed unless otherwise documented below in the visit note. 

## 2014-02-13 NOTE — Progress Notes (Signed)
BP 136/82  Pulse 80  Temp(Src) 98.1 F (36.7 C) (Oral)  Wt 144 lb (65.318 kg)   CC: sinus infection?  Subjective:    Patient ID: Joanna White, female    DOB: [birth year], 73 y.o.   MRN: [id number]  HPI: Joanna White is a 73 y.o. female presenting on 02/13/2014 for Facial Pain   Battling sinus infection for last 4 months.  Waxes and wanes.  L tooth pain and left facial puffiness, + pressure headache, pressure below left eye at maxillary sinus.  Significant congestion especially in the mornings.  Blowing nose with congestion.  + L upper toothache and L ear pain intermittently.  No fevers/chills, abd pain, nausea, coughing, wheezing, dyspnea.  Tried tylenol sinus and advil which have helped temporarily.  Using neti pot every morning and night.  Recently saw dentist who thought teeth were normal.  No sick contacts at home. No smokers at home. No h/o asthma.  Residual decreased ROM after L knee replacement.  Asks about 2nd opinion by Dr. Maureen Ralphs.  Relevant past medical, surgical, family and social history reviewed and updated as indicated.  Allergies and medications reviewed and updated. Current Outpatient Prescriptions on File Prior to Visit  Medication Sig  . aspirin 81 MG tablet Take 81 mg by mouth daily.    . Calcium Carb-Cholecalciferol (CALCIUM-VITAMIN D) 600-400 MG-UNIT TABS Take 2 tablets by mouth daily.  . cholecalciferol (VITAMIN D) 1000 UNITS tablet Take 1,000 Units by mouth daily.  Marland Kitchen latanoprost (XALATAN) 0.005 % ophthalmic solution Place into both eyes at bedtime.   Marland Kitchen levothyroxine (SYNTHROID, LEVOTHROID) 75 MCG tablet TAKE 1 TABLET BY MOUTH DAILY.  . Multiple Vitamin (MULTIVITAMIN) tablet Take 1 tablet by mouth daily.     No current facility-administered medications on file prior to visit.    Review of Systems Per HPI unless specifically indicated above    Objective:    BP 136/82  Pulse 80  Temp(Src) 98.1 F (36.7 C) (Oral)  Wt 144 lb (65.318  kg)  Physical Exam  Nursing note and vitals reviewed. Constitutional: She appears well-developed and well-nourished. No distress.  HENT:  Head: Normocephalic and atraumatic.  Right Ear: Hearing, tympanic membrane, external ear and ear canal normal.  Left Ear: Hearing, tympanic membrane, external ear and ear canal normal.  Nose: Mucosal edema present. No rhinorrhea. Right sinus exhibits no maxillary sinus tenderness and no frontal sinus tenderness. Left sinus exhibits maxillary sinus tenderness. Left sinus exhibits no frontal sinus tenderness.  Mouth/Throat: Uvula is midline, oropharynx is clear and moist and mucous membranes are normal. No oropharyngeal exudate, posterior oropharyngeal edema, posterior oropharyngeal erythema or tonsillar abscesses.  Nasal mucosal congestion  Eyes: Conjunctivae and EOM are normal. Pupils are equal, round, and reactive to light. No scleral icterus.  Neck: Normal range of motion. Neck supple.  Cardiovascular: Normal rate, regular rhythm, normal heart sounds and intact distal pulses.   No murmur heard. Pulmonary/Chest: Effort normal and breath sounds normal. No respiratory distress. She has no wheezes. She has no rales.  Lymphadenopathy:    She has no cervical adenopathy.  Skin: Skin is warm and dry. No rash noted.       Assessment & Plan:   Problem List Items Addressed This Visit   Sinusitis - Primary     Given duration and progression of sxs, will treat for bacterial acute sinusitis with augmentin course. Supportive care as per instructions.    Relevant Medications      amoxicillin-clavulanate (AUGMENTIN) tablet 875-125 mg  Follow up plan: Return if symptoms worsen or fail to improve.

## 2014-02-13 NOTE — Patient Instructions (Signed)
You have a sinus infection. Take medicine as prescribed: augmentin twice daily for 10 days Push fluids and plenty of rest. Nasal saline irrigation or neti pot to help drain sinuses. May use plain mucinex with plenty of fluid to help mobilize mucous. Let us know if fever >101.5, trouble opening/closing mouth, difficulty swallowing, or worsening - you may need to be seen again. 

## 2014-04-16 DIAGNOSIS — H409 Unspecified glaucoma: Secondary | ICD-10-CM | POA: Diagnosis not present

## 2014-04-16 DIAGNOSIS — H4011X0 Primary open-angle glaucoma, stage unspecified: Secondary | ICD-10-CM | POA: Diagnosis not present

## 2014-04-24 DIAGNOSIS — R928 Other abnormal and inconclusive findings on diagnostic imaging of breast: Secondary | ICD-10-CM | POA: Diagnosis not present

## 2014-04-24 DIAGNOSIS — Z803 Family history of malignant neoplasm of breast: Secondary | ICD-10-CM | POA: Diagnosis not present

## 2014-04-25 ENCOUNTER — Encounter: Payer: Self-pay | Admitting: Hematology & Oncology

## 2014-04-26 ENCOUNTER — Other Ambulatory Visit: Payer: Self-pay | Admitting: Hematology & Oncology

## 2014-06-08 DIAGNOSIS — T8489XA Other specified complication of internal orthopedic prosthetic devices, implants and grafts, initial encounter: Secondary | ICD-10-CM | POA: Diagnosis not present

## 2014-06-08 DIAGNOSIS — Z96659 Presence of unspecified artificial knee joint: Secondary | ICD-10-CM | POA: Diagnosis not present

## 2014-07-23 DIAGNOSIS — H4011X0 Primary open-angle glaucoma, stage unspecified: Secondary | ICD-10-CM | POA: Diagnosis not present

## 2014-07-23 DIAGNOSIS — H409 Unspecified glaucoma: Secondary | ICD-10-CM | POA: Diagnosis not present

## 2014-07-26 ENCOUNTER — Other Ambulatory Visit: Payer: Self-pay | Admitting: Hematology & Oncology

## 2014-08-27 DIAGNOSIS — Z961 Presence of intraocular lens: Secondary | ICD-10-CM | POA: Diagnosis not present

## 2014-08-27 DIAGNOSIS — H26499 Other secondary cataract, unspecified eye: Secondary | ICD-10-CM | POA: Diagnosis not present

## 2014-09-14 ENCOUNTER — Other Ambulatory Visit (HOSPITAL_BASED_OUTPATIENT_CLINIC_OR_DEPARTMENT_OTHER): Payer: Medicare Other | Admitting: Lab

## 2014-09-14 ENCOUNTER — Ambulatory Visit (HOSPITAL_BASED_OUTPATIENT_CLINIC_OR_DEPARTMENT_OTHER): Payer: Medicare Other

## 2014-09-14 ENCOUNTER — Ambulatory Visit (HOSPITAL_BASED_OUTPATIENT_CLINIC_OR_DEPARTMENT_OTHER): Payer: Medicare Other | Admitting: Family

## 2014-09-14 VITALS — BP 143/65 | HR 82 | Temp 97.6°F | Resp 16 | Wt 135.0 lb

## 2014-09-14 DIAGNOSIS — Z23 Encounter for immunization: Secondary | ICD-10-CM

## 2014-09-14 DIAGNOSIS — Z853 Personal history of malignant neoplasm of breast: Secondary | ICD-10-CM

## 2014-09-14 DIAGNOSIS — E039 Hypothyroidism, unspecified: Secondary | ICD-10-CM | POA: Diagnosis not present

## 2014-09-14 LAB — CBC WITH DIFFERENTIAL (CANCER CENTER ONLY)
BASO#: 0 10*3/uL (ref 0.0–0.2)
BASO%: 0.4 % (ref 0.0–2.0)
EOS%: 3.4 % (ref 0.0–7.0)
Eosinophils Absolute: 0.3 10*3/uL (ref 0.0–0.5)
HEMATOCRIT: 45.8 % (ref 34.8–46.6)
HGB: 15.4 g/dL (ref 11.6–15.9)
LYMPH#: 1.9 10*3/uL (ref 0.9–3.3)
LYMPH%: 25.7 % (ref 14.0–48.0)
MCH: 30.2 pg (ref 26.0–34.0)
MCHC: 33.6 g/dL (ref 32.0–36.0)
MCV: 90 fL (ref 81–101)
MONO#: 0.9 10*3/uL (ref 0.1–0.9)
MONO%: 12.1 % (ref 0.0–13.0)
NEUT%: 58.4 % (ref 39.6–80.0)
NEUTROS ABS: 4.4 10*3/uL (ref 1.5–6.5)
PLATELETS: 222 10*3/uL (ref 145–400)
RBC: 5.1 10*6/uL (ref 3.70–5.32)
RDW: 13.4 % (ref 11.1–15.7)
WBC: 7.4 10*3/uL (ref 3.9–10.0)

## 2014-09-14 LAB — CMP (CANCER CENTER ONLY)
ALBUMIN: 3.5 g/dL (ref 3.3–5.5)
ALK PHOS: 51 U/L (ref 26–84)
ALT(SGPT): 19 U/L (ref 10–47)
AST: 21 U/L (ref 11–38)
BUN, Bld: 20 mg/dL (ref 7–22)
CALCIUM: 8.9 mg/dL (ref 8.0–10.3)
CO2: 27 mEq/L (ref 18–33)
Chloride: 103 mEq/L (ref 98–108)
Creat: 0.9 mg/dl (ref 0.6–1.2)
Glucose, Bld: 102 mg/dL (ref 73–118)
POTASSIUM: 4 meq/L (ref 3.3–4.7)
Sodium: 144 mEq/L (ref 128–145)
Total Bilirubin: 0.5 mg/dl (ref 0.20–1.60)
Total Protein: 7.2 g/dL (ref 6.4–8.1)

## 2014-09-14 LAB — TSH CHCC: TSH: 0.116 m[IU]/L — AB (ref 0.308–3.960)

## 2014-09-14 MED ORDER — PNEUMOCOCCAL VAC POLYVALENT 25 MCG/0.5ML IJ INJ
0.5000 mL | INJECTION | Freq: Once | INTRAMUSCULAR | Status: AC
  Administered 2014-09-14: 0.5 mL via INTRAMUSCULAR
  Filled 2014-09-14: qty 0.5

## 2014-09-14 MED ORDER — INFLUENZA VAC SPLIT QUAD 0.5 ML IM SUSY
0.5000 mL | PREFILLED_SYRINGE | Freq: Once | INTRAMUSCULAR | Status: AC
  Administered 2014-09-14: 0.5 mL via INTRAMUSCULAR
  Filled 2014-09-14: qty 0.5

## 2014-09-14 NOTE — Patient Instructions (Signed)
Pneumococcal Vaccine, Polyvalent solution for injection What is this medicine? PNEUMOCOCCAL VACCINE, POLYVALENT (NEU mo KOK al vak SEEN, pol ee VEY luhnt) is a vaccine to prevent pneumococcus bacteria infection. These bacteria are a major cause of ear infections, Strep throat infections, and serious pneumonia, meningitis, or blood infections worldwide. These vaccines help the body to produce antibodies (protective substances) that help your body defend against these bacteria. This vaccine is recommended for people 74 years of age and older with health problems. It is also recommended for all adults over 74 years old. This vaccine will not treat an infection. This medicine may be used for other purposes; ask your health care provider or pharmacist if you have questions. COMMON BRAND NAME(S): Pneumovax 23 What should I tell my health care provider before I take this medicine? They need to know if you have any of these conditions: -bleeding problems -bone marrow or organ transplant -cancer, Hodgkin's disease -fever -infection -immune system problems -low platelet count in the blood -seizures -an unusual or allergic reaction to pneumococcal vaccine, diphtheria toxoid, other vaccines, latex, other medicines, foods, dyes, or preservatives -pregnant or trying to get pregnant -breast-feeding How should I use this medicine? This vaccine is for injection into a muscle or under the skin. It is given by a health care professional. A copy of Vaccine Information Statements will be given before each vaccination. Read this sheet carefully each time. The sheet may change frequently. Talk to your pediatrician regarding the use of this medicine in children. While this drug may be prescribed for children as young as 74 years of age for selected conditions, precautions do apply. Overdosage: If you think you have taken too much of this medicine contact a poison control center or emergency room at once. NOTE: This  medicine is only for you. Do not share this medicine with others. What if I miss a dose? It is important not to miss your dose. Call your doctor or health care professional if you are unable to keep an appointment. What may interact with this medicine? -medicines for cancer chemotherapy -medicines that suppress your immune function -medicines that treat or prevent blood clots like warfarin, enoxaparin, and dalteparin -steroid medicines like prednisone or cortisone This list may not describe all possible interactions. Give your health care provider a list of all the medicines, herbs, non-prescription drugs, or dietary supplements you use. Also tell them if you smoke, drink alcohol, or use illegal drugs. Some items may interact with your medicine. What should I watch for while using this medicine? Mild fever and pain should go away in 3 days or less. Report any unusual symptoms to your doctor or health care professional. What side effects may I notice from receiving this medicine? Side effects that you should report to your doctor or health care professional as soon as possible: -allergic reactions like skin rash, itching or hives, swelling of the face, lips, or tongue -breathing problems -confused -fever over 102 degrees F -pain, tingling, numbness in the hands or feet -seizures -unusual bleeding or bruising -unusual muscle weakness Side effects that usually do not require medical attention (report to your doctor or health care professional if they continue or are bothersome): -aches and pains -diarrhea -fever of 102 degrees F or less -headache -irritable -loss of appetite -pain, tender at site where injected -trouble sleeping This list may not describe all possible side effects. Call your doctor for medical advice about side effects. You may report side effects to FDA at 1-800-FDA-1088. Where should  I keep my medicine? This does not apply. This vaccine is given in a clinic, pharmacy,  doctor's office, or other health care setting and will not be stored at home. NOTE: This sheet is a summary. It may not cover all possible information. If you have questions about this medicine, talk to your doctor, pharmacist, or health care provider.  2015, Elsevier/Gold Standard. (2008-06-22 14:32:37) Influenza Virus Vaccine injection What is this medicine? INFLUENZA VIRUS VACCINE (in floo EN zuh VAHY ruhs vak SEEN) helps to reduce the risk of getting influenza also known as the flu. The vaccine only helps protect you against some strains of the flu. This medicine may be used for other purposes; ask your health care provider or pharmacist if you have questions. COMMON BRAND NAME(S): Afluria, Agriflu, Fluarix, Fluarix Quadrivalent, FLUCELVAX, Flulaval, Fluvirin, Fluzone, Fluzone High-Dose, Fluzone Intradermal What should I tell my health care provider before I take this medicine? They need to know if you have any of these conditions: -bleeding disorder like hemophilia -fever or infection -Guillain-Barre syndrome or other neurological problems -immune system problems -infection with the human immunodeficiency virus (HIV) or AIDS -low blood platelet counts -multiple sclerosis -an unusual or allergic reaction to influenza virus vaccine, latex, other medicines, foods, dyes, or preservatives. Different brands of vaccines contain different allergens. Some may contain latex or eggs. Talk to your doctor about your allergies to make sure that you get the right vaccine. -pregnant or trying to get pregnant -breast-feeding How should I use this medicine? This vaccine is for injection into a muscle or under the skin. It is given by a health care professional. A copy of Vaccine Information Statements will be given before each vaccination. Read this sheet carefully each time. The sheet may change frequently. Talk to your healthcare provider to see which vaccines are right for you. Some vaccines should not  be used in all age groups. Overdosage: If you think you have taken too much of this medicine contact a poison control center or emergency room at once. NOTE: This medicine is only for you. Do not share this medicine with others. What if I miss a dose? This does not apply. What may interact with this medicine? -chemotherapy or radiation therapy -medicines that lower your immune system like etanercept, anakinra, infliximab, and adalimumab -medicines that treat or prevent blood clots like warfarin -phenytoin -steroid medicines like prednisone or cortisone -theophylline -vaccines This list may not describe all possible interactions. Give your health care provider a list of all the medicines, herbs, non-prescription drugs, or dietary supplements you use. Also tell them if you smoke, drink alcohol, or use illegal drugs. Some items may interact with your medicine. What should I watch for while using this medicine? Report any side effects that do not go away within 3 days to your doctor or health care professional. Call your health care provider if any unusual symptoms occur within 6 weeks of receiving this vaccine. You may still catch the flu, but the illness is not usually as bad. You cannot get the flu from the vaccine. The vaccine will not protect against colds or other illnesses that may cause fever. The vaccine is needed every year. What side effects may I notice from receiving this medicine? Side effects that you should report to your doctor or health care professional as soon as possible: -allergic reactions like skin rash, itching or hives, swelling of the face, lips, or tongue Side effects that usually do not require medical attention (report to your doctor or  health care professional if they continue or are bothersome): -fever -headache -muscle aches and pains -pain, tenderness, redness, or swelling at the injection site -tiredness This list may not describe all possible side effects.  Call your doctor for medical advice about side effects. You may report side effects to FDA at 1-800-FDA-1088. Where should I keep my medicine? The vaccine will be given by a health care professional in a clinic, pharmacy, doctor's office, or other health care setting. You will not be given vaccine doses to store at home. NOTE: This sheet is a summary. It may not cover all possible information. If you have questions about this medicine, talk to your doctor, pharmacist, or health care provider.  2015, Elsevier/Gold Standard. (2012-05-26 42:70:62)

## 2014-09-14 NOTE — Progress Notes (Signed)
[facility]  Telephone:(336) [phone] [phone]) [phone]  ID: [name] OB: 1941-04-23 MR#: [id number] QIW#:[id number] Patient Care Team: Ria Bush, MD as PCP - General  DIAGNOSIS: 1. 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  2. Hypothyroidism.  INTERVAL HISTORY: Ms. Totten is here today for a her annual visit. She is doing very well. She has had no issues since her last visit. She denies fever, chills, n/v, cough, rash, headache, dizziness, SOB, chest pain, palpitations, abdominal pain, constipation, diarrhea, blood in urine or stool. No swelling, tenderness, numbness or tingling in her extremities. No bleeding or pain. She states that she is up to date on her mammograms and it was normal. She has not had a colonoscopy for a long time. She feels like it has been over 10 years. We will refer her to her GI Dr. Earlean Shawl who did her last colonoscopy. She will also get her flu and pneumonia vaccines today.   CURRENT TREATMENT: Synthroid 0.075 mg p.o. daily.  REVIEW OF SYSTEMS: All other 10 point review of systems is negative.   PAST MEDICAL HISTORY: Past Medical History  Diagnosis Date  . Personal history of malignant neoplasm of breast 1998    Left  . Hypothyroid   . Osteopenia 08/2011    h/o osteoporosis, s/p boniva for 5 yrs  . Glaucoma     PAST SURGICAL HISTORY: Past Surgical History  Procedure Laterality Date  . Cesarean section      x 2  . Breast lumpectomy  1998    Left  . Lymphnode removal  1998    left axilla  . Gyn surgery      uterine lining removed for heavy bleeding and polyp  . Thumb surgery      bilateral  . Dexa  08/2011    osteopenia, T -1.9, rec rpt 2 yrs    FAMILY HISTORY Family History  Problem Relation Age of Onset  . Diabetes Brother     x2  . Cancer Father 68    lung  . Cancer Mother 74    MM  . Coronary artery disease Brother 80    CABG  . Stroke Neg Hx     GYNECOLOGIC HISTORY:  No LMP  recorded. Patient is postmenopausal.   SOCIAL HISTORY: History   Social History  . Marital Status: Widowed    Spouse Name: N/A    Number of Children: 2  . Years of Education: N/A   Occupational History  . Retired    Social History Main Topics  . Smoking status: Never Smoker   . Smokeless tobacco: Never Used  . Alcohol Use: No  . Drug Use: No  . Sexual Activity: Not on file   Other Topics Concern  . 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 Smoking status: Never Smoker   . Smokeless tobacco: Never Used  . Alcohol Use: No   Colonoscopy: PAP: Bone density: Lipid panel:  Allergies  Allergen Reactions  . Fluticasone Propionate     REACTION: throat tight    Current Outpatient Prescriptions  Medication Sig Dispense Refill  . aspirin 81 MG tablet Take 81 mg by mouth daily.        Marland Kitchen  Calcium Carb-Cholecalciferol (CALCIUM-VITAMIN D) 600-400 MG-UNIT TABS Take 2 tablets by mouth daily.      . cholecalciferol (VITAMIN D) 1000 UNITS tablet Take 1,000 Units by mouth daily.      Marland Kitchen latanoprost (XALATAN) 0.005 % ophthalmic solution Place into both eyes at bedtime.       Marland Kitchen levothyroxine (SYNTHROID, LEVOTHROID) 75 MCG tablet TAKE 1 TABLET BY MOUTH DAILY.  90 tablet  0  . Multiple Vitamin (MULTIVITAMIN) tablet Take 1 tablet by mouth daily.         No current facility-administered medications for this visit.    OBJECTIVE: Filed Vitals:   09/14/14 1134  BP: 143/65  Pulse: 82  Temp: 97.6 F (36.4 C)  Resp: 16    Filed Weights   09/14/14 1134  Weight: 135 lb (61.236 kg)   ECOG FS:0 - Asymptomatic Ocular: Sclerae unicteric, pupils equal, round and reactive to light Ear-nose-throat:  Oropharynx clear, dentition fair Lymphatic: No cervical or supraclavicular adenopathy Lungs no rales or rhonchi, good excursion bilaterally Heart regular rate and rhythm, no murmur appreciated Abd soft, nontender, positive bowel sounds MSK no focal spinal tenderness, no joint edema Neuro: non-focal, well-oriented, appropriate affect Breasts: No changes, no lumps, bump, masses or rash. No lymphadenopathy.   LAB RESULTS: CMP     Component Value Date/Time   NA 144 09/14/2014 1111   NA 141 09/14/2013 1039   NA 145 06/20/2012   K 4.0 09/14/2014 1111   K 4.2 09/14/2013 1039   CL 103 09/14/2014 1111   CL 105 09/14/2013 1039   CO2 27 09/14/2014 1111   CO2 28 09/14/2013 1039   GLUCOSE 102 09/14/2014 1111   GLUCOSE 97 09/14/2013 1039   BUN 20 09/14/2014 1111   BUN 17 09/14/2013 1039   BUN 16 06/20/2012   CREATININE 0.9 09/14/2014 1111   CREATININE 0.93 09/14/2013 1039   CALCIUM 8.9 09/14/2014 1111   CALCIUM 9.6 09/14/2013 1039   PROT 7.2 09/14/2014 1111   PROT 7.3 09/14/2013 1039   ALBUMIN 4.2 09/14/2013 1039   AST 21 09/14/2014 1111   AST 20 09/14/2013 1039   ALT 19 09/14/2014 1111   ALT 18 09/14/2013 1039   ALKPHOS 51 09/14/2014 1111   ALKPHOS 60 09/14/2013 1039   BILITOT 0.50 09/14/2014 1111   BILITOT 0.4 09/14/2013 1039   No results found for this basename: SPEP, UPEP,  kappa and lambda light chains   Lab Results  Component Value Date   WBC 7.4 09/14/2014   NEUTROABS 4.4 09/14/2014   HGB 15.4 09/14/2014   HCT 45.8 09/14/2014   MCV 90 09/14/2014   PLT 222 09/14/2014   No results found for this basename: LABCA2   No components found with this basename: LABCA125   No results found for this basename: INR,  in the last 168 hours  STUDIES: No results found.  ASSESSMENT/PLAN: Ms. Nigg is a 73 year old white female with history of stage IIA ductal carcinoma of the left breast. She had adjuvant chemotherapy with CM[facility]. The tumor was ER negative. She is now 17 years  out.  She has done very well and there has been nothing to suggest recurrence.  We gave her her flu and pneumonia vaccines toay.  We also referred her to dr. Earlean Shawl for a colonoscopy.  We will see her back in 1 year for labs and follow-up.  She knows to call here with any questions or concerns and to go to the ED in the event of an emergency. We  can certainly see her sooner if need be.   Eliezer Bottom, NP 09/14/2014 1:06 PM

## 2014-09-17 ENCOUNTER — Ambulatory Visit: Payer: Medicare Other

## 2014-09-17 ENCOUNTER — Telehealth: Payer: Self-pay | Admitting: Nurse Practitioner

## 2014-09-17 NOTE — Telephone Encounter (Addendum)
Message copied by Jimmy Footman on Mon Sep 17, 2014  3:39 PM ------      Message from: Volanda Napoleon      Created: Fri Sep 14, 2014  6:34 PM       Please call her and tell her that her thyroid level is normal.. Thanks. Pete ------lvm on pt's personal machine.

## 2014-10-22 DIAGNOSIS — H4011X2 Primary open-angle glaucoma, moderate stage: Secondary | ICD-10-CM | POA: Diagnosis not present

## 2014-10-27 ENCOUNTER — Other Ambulatory Visit: Payer: Self-pay | Admitting: Hematology & Oncology

## 2014-10-29 DIAGNOSIS — Z1231 Encounter for screening mammogram for malignant neoplasm of breast: Secondary | ICD-10-CM | POA: Diagnosis not present

## 2014-10-29 DIAGNOSIS — Z853 Personal history of malignant neoplasm of breast: Secondary | ICD-10-CM | POA: Diagnosis not present

## 2014-10-29 LAB — HM MAMMOGRAPHY: HM Mammogram: NORMAL

## 2014-11-01 ENCOUNTER — Encounter: Payer: Self-pay | Admitting: Hematology & Oncology

## 2014-11-30 HISTORY — PX: TOTAL KNEE ARTHROPLASTY: SHX125

## 2015-01-21 DIAGNOSIS — H4011X2 Primary open-angle glaucoma, moderate stage: Secondary | ICD-10-CM | POA: Diagnosis not present

## 2015-01-21 DIAGNOSIS — Z961 Presence of intraocular lens: Secondary | ICD-10-CM | POA: Diagnosis not present

## 2015-01-22 ENCOUNTER — Other Ambulatory Visit: Payer: Self-pay | Admitting: Hematology & Oncology

## 2015-04-01 ENCOUNTER — Ambulatory Visit (INDEPENDENT_AMBULATORY_CARE_PROVIDER_SITE_OTHER): Payer: Medicare Other | Admitting: Family Medicine

## 2015-04-01 ENCOUNTER — Encounter: Payer: Self-pay | Admitting: Family Medicine

## 2015-04-01 VITALS — BP 140/78 | HR 92 | Temp 98.1°F | Wt 141.2 lb

## 2015-04-01 DIAGNOSIS — G5712 Meralgia paresthetica, left lower limb: Secondary | ICD-10-CM

## 2015-04-01 MED ORDER — GABAPENTIN 100 MG PO CAPS: 100.0000 mg | ORAL_CAPSULE | Freq: Two times a day (BID) | ORAL | Status: DC | PRN

## 2015-04-01 NOTE — Patient Instructions (Addendum)
For meralgia paresthetica - start b complex vitamin daily for next few weeks. Try gabapentin 100mg  twice daily as needed for nerve pain. If not helpful let me know - we can increase dose.

## 2015-04-01 NOTE — Assessment & Plan Note (Addendum)
Discussed etiology and anticipated cause of sxs (compression of LFCN while weeding and strawberry picking). rec B complex vitamin for 2 wks and prescribed gabapentin 100mg  bid prn for neuralgia. Advised to monitor for vesicular rash

## 2015-04-01 NOTE — Progress Notes (Signed)
Pre visit review using our clinic review tool, if applicable. No additional management support is needed unless otherwise documented below in the visit note. 

## 2015-04-01 NOTE — Progress Notes (Signed)
   BP 140/78 mmHg  Pulse 92  Temp(Src) 98.1 F (36.7 C) (Oral)  Wt 141 lb 4 oz (64.071 kg)   CC: L leg pain  Subjective:    Patient ID: Joanna White, female    DOB: 12/21/1940, 74 y.o.   MRN: [id number]  HPI: Joanna White is a 74 y.o. female presenting on 04/01/2015 for Leg Pain   3d h/o L lateral thigh burning pain/numbness that comes on with standing or movement >15 min. She has been putting pressure on left thigh when removing weeds 1d prior to sxs starting.   No back pain, radiculopathy, weakness of legs, saddle anesthesia, fevers, bowel/bladder incontinence. No falls or injury.  So far has tried hearing pad, ice without improvement. Has also tried advil without improvement.  H/o L knee replacement.   Relevant past medical, surgical, family and social history reviewed and updated as indicated. Interim medical history since our last visit reviewed. Allergies and medications reviewed and updated. Current Outpatient Prescriptions on File Prior to Visit  Medication Sig  . aspirin 81 MG tablet Take 81 mg by mouth daily.    . Calcium Carb-Cholecalciferol (CALCIUM-VITAMIN D) 600-400 MG-UNIT TABS Take 2 tablets by mouth daily.  . cholecalciferol (VITAMIN D) 1000 UNITS tablet Take 1,000 Units by mouth daily.  Marland Kitchen latanoprost (XALATAN) 0.005 % ophthalmic solution Place into both eyes at bedtime.   Marland Kitchen levothyroxine (SYNTHROID, LEVOTHROID) 75 MCG tablet TAKE 1 TABLET BY MOUTH DAILY.  . Multiple Vitamin (MULTIVITAMIN) tablet Take 1 tablet by mouth daily.     No current facility-administered medications on file prior to visit.    Review of Systems Per HPI unless specifically indicated above     Objective:    BP 140/78 mmHg  Pulse 92  Temp(Src) 98.1 F (36.7 C) (Oral)  Wt 141 lb 4 oz (64.071 kg)  Wt Readings from Last 3 Encounters:  04/01/15 141 lb 4 oz (64.071 kg)  09/14/14 135 lb (61.236 kg)  02/13/14 144 lb (65.318 kg)    Physical Exam  Constitutional: She is  oriented to person, place, and time. She appears well-developed and well-nourished. No distress.  Musculoskeletal: She exhibits no edema.  No pain midline spine No paraspinous mm tenderness Neg SLR bilaterally. No pain with int/ext rotation at hip.  Neurological: She is alert and oriented to person, place, and time. She has normal strength. No sensory deficit.  Reflex Scores:      Patellar reflexes are 2+ on the right side and 2+ on the left side. Temperature sensation intact Diminshed sensation to light touch in distribution of lateral fem cutaneous nerve  Skin: Skin is warm and dry. No rash noted.  No vesicular rash  Nursing note and vitals reviewed.     Assessment & Plan:   Problem List Items Addressed This Visit    Meralgia paresthetica of left side - Primary    Discussed etiology and anticipated cause of sxs (compression of LFCN while weeding and strawberry picking). rec B complex vitamin for 2 wks and prescribed gabapentin 100mg  bid prn for neuralgia. Advised to monitor for vesicular rash      Relevant Medications   gabapentin (NEURONTIN) 100 MG capsule       Follow up plan: Return if symptoms worsen or fail to improve.

## 2015-04-22 ENCOUNTER — Other Ambulatory Visit: Payer: Self-pay | Admitting: Hematology & Oncology

## 2015-04-22 DIAGNOSIS — H4011X2 Primary open-angle glaucoma, moderate stage: Secondary | ICD-10-CM | POA: Diagnosis not present

## 2015-06-28 ENCOUNTER — Encounter: Payer: Self-pay | Admitting: Family Medicine

## 2015-07-01 ENCOUNTER — Encounter: Payer: Self-pay | Admitting: Family Medicine

## 2015-07-01 ENCOUNTER — Ambulatory Visit (INDEPENDENT_AMBULATORY_CARE_PROVIDER_SITE_OTHER): Payer: Medicare Other | Admitting: Family Medicine

## 2015-07-01 VITALS — BP 138/76 | HR 84 | Temp 98.5°F | Wt 142.2 lb

## 2015-07-01 DIAGNOSIS — H6121 Impacted cerumen, right ear: Secondary | ICD-10-CM | POA: Diagnosis not present

## 2015-07-01 DIAGNOSIS — H6120 Impacted cerumen, unspecified ear: Secondary | ICD-10-CM | POA: Insufficient documentation

## 2015-07-01 MED ORDER — AMOXICILLIN-POT CLAVULANATE 875-125 MG PO TABS: 1.0000 | ORAL_TABLET | Freq: Two times a day (BID) | ORAL | Status: AC

## 2015-07-01 NOTE — Progress Notes (Signed)
SUBJECTIVE:  Joanna White is a 74 y.o. female pt of Dr. Darnell Level, who complains of coryza and right sinus pain and right ear pain for 7 days. She denies a history of anorexia, chest pain, dizziness, fevers, myalgias, nausea, shortness of breath and weakness and denies a history of asthma. Patient denies smoke cigarettes.   Current Outpatient Prescriptions on File Prior to Visit  Medication Sig Dispense Refill  . aspirin 81 MG tablet Take 81 mg by mouth daily.      . beta carotene w/minerals (OCUVITE) tablet Take 1 tablet by mouth daily.    . Calcium Carb-Cholecalciferol (CALCIUM-VITAMIN D) 600-400 MG-UNIT TABS Take 2 tablets by mouth daily.    Marland Kitchen latanoprost (XALATAN) 0.005 % ophthalmic solution Place into both eyes at bedtime.     Marland Kitchen levothyroxine (SYNTHROID, LEVOTHROID) 75 MCG tablet TAKE 1 TABLET BY MOUTH DAILY. 90 tablet 0  . Multiple Vitamin (MULTIVITAMIN) tablet Take 1 tablet by mouth daily.       No current facility-administered medications on file prior to visit.    Allergies  Allergen Reactions  . Fluticasone Propionate     REACTION: throat tight    Past Medical History  Diagnosis Date  . Personal history of malignant neoplasm of breast 1998    Left  . Hypothyroid   . Osteopenia 08/2011    h/o osteoporosis, s/p boniva for 5 yrs  . Glaucoma     Past Surgical History  Procedure Laterality Date  . Cesarean section      x 2  . Breast lumpectomy  1998    Left  . Lymphnode removal  1998    left axilla  . Gyn surgery      uterine lining removed for heavy bleeding and polyp  . Thumb surgery      bilateral  . Dexa  08/2011    osteopenia, T -1.9, rec rpt 2 yrs    Family History  Problem Relation Age of Onset  . Diabetes Brother     x2  . Cancer Father 60    lung  . Cancer Mother 2    MM  . Coronary artery disease Brother 71    CABG  . Stroke Neg Hx     History   Social History  . Marital Status: Widowed    Spouse Name: N/A  . Number of Children: 2  .  Years of Education: N/A   Occupational History  . Retired    Social History Main Topics  . Smoking status: Never Smoker   . Smokeless tobacco: Never Used  . Alcohol Use: No  . Drug Use: No  . Sexual Activity: Not on file   Other Topics Concern  . 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  OBJECTIVE: BP 138/76 mmHg  Pulse 84  Temp(Src) 98.5 F (36.9 C) (Oral)  Wt 142 lb 4 oz (64.524 kg)  SpO2 96%  She appears well, vital signs are as noted. Ears normal.  Throat and pharynx normal.  Neck supple. No adenopathy in the neck. Nose is congested. Sinuses non tender. The chest is clear, without wheezes or rales.  ASSESSMENT:  Sinusitis and  cerumen impaction  PLAN: Given duration and progression of symptoms, will treat for bacterial sinusitis with Augmentin.  Symptomatic therapy suggested: push fluids, rest and return office visit prn if symptoms persist or worsen.. Call or return to clinic prn if these symptoms worsen or fail to improve as anticipated.  Ceruminosis is noted.  Wax is removed by syringing and manual debridement. Instructions for home care to prevent wax buildup are given.

## 2015-07-01 NOTE — Progress Notes (Signed)
Pre visit review using our clinic review tool, if applicable. No additional management support is needed unless otherwise documented below in the visit note. 

## 2015-07-04 ENCOUNTER — Encounter: Payer: Self-pay | Admitting: Cardiology

## 2015-07-08 DIAGNOSIS — H4011X2 Primary open-angle glaucoma, moderate stage: Secondary | ICD-10-CM | POA: Diagnosis not present

## 2015-07-23 ENCOUNTER — Other Ambulatory Visit: Payer: Self-pay | Admitting: Hematology & Oncology

## 2015-09-13 ENCOUNTER — Encounter: Payer: Self-pay | Admitting: Hematology & Oncology

## 2015-09-13 ENCOUNTER — Ambulatory Visit (HOSPITAL_BASED_OUTPATIENT_CLINIC_OR_DEPARTMENT_OTHER): Payer: Medicare Other

## 2015-09-13 ENCOUNTER — Other Ambulatory Visit (HOSPITAL_BASED_OUTPATIENT_CLINIC_OR_DEPARTMENT_OTHER): Payer: Medicare Other

## 2015-09-13 ENCOUNTER — Ambulatory Visit (HOSPITAL_BASED_OUTPATIENT_CLINIC_OR_DEPARTMENT_OTHER): Payer: Medicare Other | Admitting: Hematology & Oncology

## 2015-09-13 VITALS — BP 145/61 | HR 80 | Temp 97.6°F | Resp 14 | Ht 61.0 in | Wt 138.0 lb

## 2015-09-13 DIAGNOSIS — Z853 Personal history of malignant neoplasm of breast: Secondary | ICD-10-CM

## 2015-09-13 DIAGNOSIS — E890 Postprocedural hypothyroidism: Secondary | ICD-10-CM | POA: Diagnosis not present

## 2015-09-13 DIAGNOSIS — E039 Hypothyroidism, unspecified: Secondary | ICD-10-CM

## 2015-09-13 DIAGNOSIS — Z23 Encounter for immunization: Secondary | ICD-10-CM

## 2015-09-13 DIAGNOSIS — M810 Age-related osteoporosis without current pathological fracture: Secondary | ICD-10-CM | POA: Diagnosis not present

## 2015-09-13 LAB — COMPREHENSIVE METABOLIC PANEL (CC13)
ALT: 16 U/L (ref 0–55)
AST: 20 U/L (ref 5–34)
Albumin: 3.9 g/dL (ref 3.5–5.0)
Alkaline Phosphatase: 66 U/L (ref 40–150)
Anion Gap: 8 mEq/L (ref 3–11)
BUN: 12.6 mg/dL (ref 7.0–26.0)
CHLORIDE: 108 meq/L (ref 98–109)
CO2: 24 mEq/L (ref 22–29)
CREATININE: 0.8 mg/dL (ref 0.6–1.1)
Calcium: 9.5 mg/dL (ref 8.4–10.4)
EGFR: 73 mL/min/{1.73_m2} — ABNORMAL LOW (ref >90)
Glucose: 101 mg/dl (ref 70–140)
POTASSIUM: 4.3 meq/L (ref 3.5–5.1)
SODIUM: 140 meq/L (ref 136–145)
Total Bilirubin: 0.62 mg/dL (ref 0.20–1.20)
Total Protein: 7.3 g/dL (ref 6.4–8.3)

## 2015-09-13 LAB — CBC WITH DIFFERENTIAL (CANCER CENTER ONLY)
BASO#: 0 10*3/uL (ref 0.0–0.2)
BASO%: 0.4 % (ref 0.0–2.0)
EOS%: 3.9 % (ref 0.0–7.0)
Eosinophils Absolute: 0.3 10*3/uL (ref 0.0–0.5)
HCT: 46 % (ref 34.8–46.6)
HEMOGLOBIN: 15.4 g/dL (ref 11.6–15.9)
LYMPH#: 1.8 10*3/uL (ref 0.9–3.3)
LYMPH%: 24.8 % (ref 14.0–48.0)
MCH: 29.8 pg (ref 26.0–34.0)
MCHC: 33.5 g/dL (ref 32.0–36.0)
MCV: 89 fL (ref 81–101)
MONO#: 0.9 10*3/uL (ref 0.1–0.9)
MONO%: 12.7 % (ref 0.0–13.0)
NEUT%: 58.2 % (ref 39.6–80.0)
NEUTROS ABS: 4.2 10*3/uL (ref 1.5–6.5)
Platelets: 192 10*3/uL (ref 145–400)
RBC: 5.16 10*6/uL (ref 3.70–5.32)
RDW: 13.4 % (ref 11.1–15.7)
WBC: 7.2 10*3/uL (ref 3.9–10.0)

## 2015-09-13 MED ORDER — INFLUENZA VAC SPLIT QUAD 0.5 ML IM SUSY
0.5000 mL | PREFILLED_SYRINGE | Freq: Once | INTRAMUSCULAR | Status: AC
  Administered 2015-09-13: 0.5 mL via INTRAMUSCULAR
  Filled 2015-09-13: qty 0.5

## 2015-09-13 NOTE — Patient Instructions (Signed)

## 2015-09-13 NOTE — Progress Notes (Signed)
[place]  [phone]) [phone] Fax:(336) [phone]  ID: Joanna White OB: 1941-05-07 MR#: [id number] JSE#:[id number] Patient Care Team: Ria Bush, MD as PCP - General  DIAGNOSIS: 1. 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  2. Hypothyroidism.  INTERVAL HISTORY: Joanna White is here today for a her annual visit. She is quite happy. Her son had their third child 2 months ago. This would be I think her fifth grandchild.  Overall, she's had no problems as last saw her a year ago. Per she's had a good summer. 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  She's had no problems with her thyroid.  Her last mammogram was done in November 2015. Everything looked okay on the mammogram.  She's had no change in bowel or bladder habits.  She's had no rashes.  There's been no leg swelling.  She's had no cough. There's no shortness of breath.  Overall, her performance status is ECOG 0.   CURRENT TREATMENT: Synthroid 0.075 mg p.o. daily.  REVIEW OF SYSTEMS: All other 10 point review of systems is negative.   PAST MEDICAL HISTORY: Past Medical History  Diagnosis Date  . Personal history of malignant neoplasm of breast 1998    Left  . Hypothyroid   . Osteopenia 08/2011    h/o osteoporosis, s/p boniva for 5 yrs  . Glaucoma     PAST SURGICAL HISTORY: Past Surgical History  Procedure Laterality Date  . Cesarean section      x 2  . Breast lumpectomy  1998    Left  . Lymphnode removal  1998    left axilla  . Gyn surgery      uterine lining removed for heavy bleeding and polyp  . Thumb surgery      bilateral  . Dexa  08/2011    osteopenia, T -1.9, rec rpt 2 yrs    FAMILY HISTORY Family History  Problem Relation Age of Onset  . Diabetes Brother     x2  . Cancer Father 75    lung  . Cancer Mother 38    MM  . Coronary artery disease Brother 71    CABG  . Stroke Neg Hx     GYNECOLOGIC HISTORY:  No LMP recorded. Patient is  postmenopausal.   SOCIAL HISTORY: Social History   Social History  . Marital Status: Widowed    Spouse Name: N/A  . Number of Children: 2  . Years of Education: N/A   Occupational History  . Retired    Social History Main Topics  . Smoking status: Never Smoker   . Smokeless tobacco: Never Used  . Alcohol Use: No  . Drug Use: No  . Sexual Activity: Not on file   Other Topics Concern  . 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 Smoking status: Never Smoker   . Smokeless tobacco: Never Used  . Alcohol Use: No   Colonoscopy: PAP: Bone density: Lipid panel:  Allergies  Allergen Reactions  . Fluticasone Propionate     REACTION: throat tight    Current Outpatient Prescriptions  Medication Sig Dispense Refill  . aspirin 81 MG tablet Take 81  mg by mouth daily.      . beta carotene w/minerals (OCUVITE) tablet Take 1 tablet by mouth daily.    . Calcium Carb-Cholecalciferol (CALCIUM-VITAMIN D) 600-400 MG-UNIT TABS Take 2 tablets by mouth daily.    Marland Kitchen latanoprost (XALATAN) 0.005 % ophthalmic solution Place into both eyes at bedtime.     Marland Kitchen levothyroxine (SYNTHROID, LEVOTHROID) 75 MCG tablet TAKE 1 TABLET BY MOUTH DAILY. 90 tablet 0  . Multiple Vitamin (MULTIVITAMIN) tablet Take 1 tablet by mouth daily.       Current Facility-Administered Medications  Medication Dose Route Frequency Provider Last Rate Last Dose  . Influenza vac split quadrivalent PF (FLUARIX) injection 0.5 mL  0.5 mL Intramuscular Once Volanda Napoleon, MD        OBJECTIVE: Filed Vitals:   09/13/15 1141  BP: 145/61  Pulse: 80  Temp: 97.6 F (36.4 C)  Resp: 14    Filed Weights   09/13/15 1141  Weight: 138  lb (62.596 kg)   well-developed and well-nourished white female in no obvious distress. Head and neck exam shows no ocular or oral lesions. She has no palpable cervical or supraclavicular lymph nodes. Lungs are clear. Cardiac exam regular rate and rhythm with no murmurs, rubs or bruits. Breast exam shows right breast with no masses, edema or erythema. There is no right axillary adenopathy. Left breast shows well-healed lumpectomy at the 4:00 position. No distinct masses noted in the left breast. There is no left axillary adenopathy. Abdomen is soft. She has good bowel sounds. There is no fluid wave. There is no palpable liver or spleen tip. Back exam shows no kyphosis. There is no tenderness over the spine, ribs or hips. Extremities shows no clubbing, cyanosis or edema. Skin exam shows no rashes, ecchymoses or petechia. Neurological exam shows no focal neurological deficits.   LAB RESULTS: CMP     Component Value Date/Time   NA 144 09/14/2014 1111   NA 141 09/14/2013 1039   NA 145 06/20/2012   K 4.0 09/14/2014 1111   K 4.2 09/14/2013 1039   CL 103 09/14/2014 1111   CL 105 09/14/2013 1039   CO2 27 09/14/2014 1111   CO2 28 09/14/2013 1039   GLUCOSE 102 09/14/2014 1111   GLUCOSE 97 09/14/2013 1039   BUN 20 09/14/2014 1111   BUN 17 09/14/2013 1039   BUN 16 06/20/2012   CREATININE 0.9 09/14/2014 1111   CREATININE 0.93 09/14/2013 1039   CALCIUM 8.9 09/14/2014 1111   CALCIUM 9.6 09/14/2013 1039   PROT 7.2 09/14/2014 1111   PROT 7.3 09/14/2013 1039   ALBUMIN 3.5 09/14/2014 1111   ALBUMIN 4.2 09/14/2013 1039   AST 21 09/14/2014 1111   AST 20 09/14/2013 1039   ALT 19 09/14/2014 1111   ALT 18 09/14/2013 1039   ALKPHOS 51 09/14/2014 1111   ALKPHOS 60 09/14/2013 1039   BILITOT 0.50 09/14/2014 1111   BILITOT 0.4 09/14/2013 1039   No results found for: SPEP Lab Results  Component Value Date   WBC 7.2 09/13/2015   NEUTROABS 4.2 09/13/2015   HGB 15.4 09/13/2015   HCT 46.0 09/13/2015   MCV  89 09/13/2015   PLT 192 09/13/2015   No results found for: LABCA2 No components found for: LABCA125 No results for input(s): INR in the last 168 hours.  STUDIES: No results found.  ASSESSMENT/PLAN: Joanna White is a 74 year old white female with history of stage IIA ductal carcinoma of the left breast. She had adjuvant chemotherapy with [facility].  The tumor was ER negative. She is now 18 years out.  She has done very well and there has been nothing to suggest recurrence.  We gave her the flu vaccine today.  She has not yet had a colonoscopy. We have tried to refer her but somehow, she has not followed through on this. As such, we will let her do this at her leisure..    We will plan to see her back in one year.    Volanda Napoleon, MD 09/13/2015 12:26 PM

## 2015-09-16 LAB — TSH CHCC: TSH: 0.121 m[IU]/L — AB (ref 0.308–3.960)

## 2015-10-17 DIAGNOSIS — H401132 Primary open-angle glaucoma, bilateral, moderate stage: Secondary | ICD-10-CM | POA: Diagnosis not present

## 2015-10-31 DIAGNOSIS — M85852 Other specified disorders of bone density and structure, left thigh: Secondary | ICD-10-CM | POA: Diagnosis not present

## 2015-10-31 DIAGNOSIS — Z853 Personal history of malignant neoplasm of breast: Secondary | ICD-10-CM | POA: Diagnosis not present

## 2015-10-31 DIAGNOSIS — Z1231 Encounter for screening mammogram for malignant neoplasm of breast: Secondary | ICD-10-CM | POA: Diagnosis not present

## 2015-10-31 HISTORY — PX: OTHER SURGICAL HISTORY: SHX169

## 2015-11-05 DIAGNOSIS — Z853 Personal history of malignant neoplasm of breast: Secondary | ICD-10-CM | POA: Diagnosis not present

## 2015-11-05 DIAGNOSIS — N6002 Solitary cyst of left breast: Secondary | ICD-10-CM | POA: Diagnosis not present

## 2015-11-05 DIAGNOSIS — Z1231 Encounter for screening mammogram for malignant neoplasm of breast: Secondary | ICD-10-CM | POA: Diagnosis not present

## 2015-11-27 ENCOUNTER — Encounter: Payer: Self-pay | Admitting: Hematology & Oncology

## 2015-11-28 ENCOUNTER — Encounter: Payer: Self-pay | Admitting: *Deleted

## 2016-01-03 ENCOUNTER — Telehealth: Payer: Self-pay | Admitting: Family Medicine

## 2016-01-03 NOTE — Telephone Encounter (Signed)
This was in her chart. Not sure why she received call.

## 2016-01-03 NOTE — Telephone Encounter (Signed)
Patient received automated call.  Patient said she received her flu shot at Dr.Ennever's office on 09/12/16.

## 2016-01-13 DIAGNOSIS — H401132 Primary open-angle glaucoma, bilateral, moderate stage: Secondary | ICD-10-CM | POA: Diagnosis not present

## 2016-01-13 DIAGNOSIS — H26491 Other secondary cataract, right eye: Secondary | ICD-10-CM | POA: Diagnosis not present

## 2016-01-13 DIAGNOSIS — Z961 Presence of intraocular lens: Secondary | ICD-10-CM | POA: Diagnosis not present

## 2016-01-21 ENCOUNTER — Other Ambulatory Visit: Payer: Self-pay | Admitting: Hematology & Oncology

## 2016-01-29 DIAGNOSIS — H2640 Unspecified secondary cataract: Secondary | ICD-10-CM | POA: Diagnosis not present

## 2016-01-29 DIAGNOSIS — H26491 Other secondary cataract, right eye: Secondary | ICD-10-CM | POA: Diagnosis not present

## 2016-02-10 ENCOUNTER — Encounter: Payer: Self-pay | Admitting: Hematology & Oncology

## 2016-02-28 ENCOUNTER — Ambulatory Visit (INDEPENDENT_AMBULATORY_CARE_PROVIDER_SITE_OTHER): Payer: Medicare Other

## 2016-02-28 VITALS — BP 132/70 | HR 82 | Temp 97.9°F | Ht 61.0 in | Wt 138.8 lb

## 2016-02-28 DIAGNOSIS — Z0000 Encounter for general adult medical examination without abnormal findings: Secondary | ICD-10-CM

## 2016-02-28 DIAGNOSIS — Z23 Encounter for immunization: Secondary | ICD-10-CM

## 2016-02-28 NOTE — Progress Notes (Signed)
I reviewed health advisor's note, was available for consultation, and agree with documentation and plan.  

## 2016-02-28 NOTE — Progress Notes (Signed)
Subjective:   Joanna White is a 75 y.o. female who presents for an Initial Medicare Annual Wellness Visit.   Cardiac Risk Factors include: advanced age (>25men, >20 women)     Objective:    Today's Vitals   02/28/16 0940  BP: 132/70  Pulse: 82  Temp: 97.9 F (36.6 C)  TempSrc: Oral  Height: 5\' 1"  (1.549 m)  Weight: 138 lb 12 oz (62.937 kg)  SpO2: 95%  PainSc: 0-No pain   Body mass index is 26.23 kg/(m^2).   Current Medications (verified) Outpatient Encounter Prescriptions as of 02/28/2016  Medication Sig  . aspirin 81 MG tablet Take 81 mg by mouth daily.    . beta carotene w/minerals (OCUVITE) tablet Take 1 tablet by mouth daily.  . Calcium Carb-Cholecalciferol (CALCIUM-VITAMIN D) 600-400 MG-UNIT TABS Take 2 tablets by mouth daily.  Marland Kitchen latanoprost (XALATAN) 0.005 % ophthalmic solution Place into both eyes at bedtime.   Marland Kitchen levothyroxine (SYNTHROID, LEVOTHROID) 75 MCG tablet TAKE 1 TABLET BY MOUTH DAILY.  . Multiple Vitamin (MULTIVITAMIN) tablet Take 1 tablet by mouth daily.     No facility-administered encounter medications on file as of 02/28/2016.    Allergies (verified) Fluticasone propionate   History: Past Medical History  Diagnosis Date  . Personal history of malignant neoplasm of breast 1998    Left  . Hypothyroid   . Osteopenia 08/2011    h/o osteoporosis, s/p boniva for 5 yrs  . Glaucoma    Past Surgical History  Procedure Laterality Date  . Cesarean section      x 2  . Breast lumpectomy  1998    Left  . Lymphnode removal  1998    left axilla  . Gyn surgery      uterine lining removed for heavy bleeding and polyp  . Thumb surgery      bilateral  . Dexa  08/2011    osteopenia, T -1.9, rec rpt 2 yrs   Family History  Problem Relation Age of Onset  . Diabetes Brother     x2  . Cancer Father 53    lung  . Cancer Mother 45    MM  . Coronary artery disease Brother 74    CABG  . Stroke Neg Hx    Social History   Occupational  History  . Retired    Social History Main Topics  . Smoking status: Never Smoker   . Smokeless tobacco: Never Used  . Alcohol Use: No  . Drug Use: No  . Sexual Activity: No    Tobacco Counseling Counseling given: No   Activities of Daily Living In your present state of health, do you have any difficulty performing the following activities: 02/28/2016  Hearing? N  Vision? N  Difficulty concentrating or making decisions? N  Walking or climbing stairs? N  Dressing or bathing? N  Doing errands, shopping? N  Preparing Food and eating ? N  Using the Toilet? N  In the past six months, have you accidently leaked urine? N  Do you have problems with loss of bowel control? N  Managing your Medications? N  Managing your Finances? N  Housekeeping or managing your Housekeeping? N    Immunizations and Health Maintenance Immunization History  Administered Date(s) Administered  . Influenza Whole 11/02/2007, 11/02/2012  . Influenza,inj,Quad PF,36+ Mos 09/14/2013, 09/14/2014, 09/13/2015  . Pneumococcal Conjugate-13 02/28/2016  . Pneumococcal Polysaccharide-23 09/14/2014   There are no preventive care reminders to display for this patient.  Patient Care Team:  Ria Bush, MD as PCP - General     Assessment:   This is a routine wellness examination for [facility].  Hearing/Vision screen  Hearing Screening   125Hz  250Hz  500Hz  1000Hz  2000Hz  4000Hz  8000Hz   Right ear:   40 40 40 40   Left ear:   40 40 40 40   Vision Screening Comments: Last eye exam in Feb 2017  Dietary issues and exercise activities discussed: Current Exercise Habits: Home exercise routine, Type of exercise: walking, Time (Minutes): 45, Frequency (Times/Week): 3, Weekly Exercise (Minutes/Week): 135, Intensity: Mild, Exercise limited by: None identified  Depression Screen PHQ 2/9 Scores 02/28/2016  PHQ - 2 Score 0    Fall Risk Fall Risk  02/28/2016 09/13/2015 09/14/2014  Falls in the past year? No No No     Cognitive Function: MMSE - Mini Mental State Exam 02/28/2016  Orientation to time 5  Orientation to Place 5  Registration 3  Attention/ Calculation 0  Recall 3  Language- name 2 objects 0  Language- repeat 1  Language- follow 3 step command 3  Language- read & follow direction 0  Write a sentence 0  Copy design 0  Total score 20   PLEASE NOTE: A Mini-Cog screen was completed. Maximum score is 20. A value of 0 denotes this part of [place] MMSE was not completed.  Orientation to Time - Max 5 Orientation to Place - Max 5 Registration - Max 3 Recall - Max 3 Language Repeat - Max 1 Language Follow 3 Step Command - Max 3  Screening Tests Health Maintenance  Topic Date Due  . Fecal DNA (Cologuard) - office will order for pt 08/30/2016 (Originally 09/21/1990)  . ZOSTAVAX - insurance verification 01/28/2017 (Originally 09/21/2000)  . TETANUS/TDAP - insurance verifcation 01/28/2017 (Originally [birth year]/22/1959)  . INFLUENZA VACCINE  06/30/2016  . DTaP/Tdap/Td  Completed  . DEXA SCAN  Completed  . PNA vac Low Risk Adult - administered Completed      Plan:     I have personally reviewed and addressed the Medicare Annual Wellness questionnaire and have noted the following in the patient's chart:  A. Medical and social history B. Use of alcohol, tobacco or illicit drugs  C. Current medications and supplements D. Functional ability and status E.  Nutritional status F.  Physical activity G. Advance directives H. List of other physicians I.  Hospitalizations, surgeries, and ER visits in previous 12 months J.  Jones to include hearing, vision, cognitive, depression L. Referrals and appointments - none  In addition, I have reviewed and discussed with patient certain preventive protocols, quality metrics, and best practice recommendations. A written personalized care plan for preventive services as well as general preventive health recommendations were provided to  patient.  See attached scanned questionnaire for additional information.   Signed,   Lindell Noe, MHA, BS, LPN Health Advisor [id number]

## 2016-02-28 NOTE — Progress Notes (Signed)
Pre visit review using our clinic review tool, if applicable. No additional management support is needed unless otherwise documented below in the visit note. 

## 2016-02-28 NOTE — Patient Instructions (Addendum)
Joanna White , Thank you for taking time to come for your Medicare Wellness Visit. I appreciate your ongoing commitment to your health goals. Please review the following plan we discussed and let me know if I can assist you in the future.    This is a list of the screening recommended for you and due dates:  Health Maintenance  Topic Date Due  . Cologuard (Stool DNA test)  08/30/2016*  . Shingles Vaccine  01/28/2017*  . Tetanus Vaccine  01/28/2017*  . Flu Shot  06/30/2016  . DTaP/Tdap/Td vaccine  Completed  . DEXA scan (bone density measurement)  Completed  . Pneumonia vaccines  Completed  *Topic was postponed. The date shown is not the original due date.   Preventive Care for Adults  A healthy lifestyle and preventive care can promote health and wellness. Preventive health guidelines for adults include the following key practices.  . A routine yearly physical is a good way to check with your health care provider about your health and preventive screening. It is a chance to share any concerns and updates on your health and to receive a thorough exam.  . Visit your dentist for a routine exam and preventive care every 6 months. Brush your teeth twice a day and floss once a day. Good oral hygiene prevents tooth decay and gum disease.  . The frequency of eye exams is based on your age, health, family medical history, use  of contact lenses, and other factors. Follow your health care provider's ecommendations for frequency of eye exams.  . Eat a healthy diet. Foods like vegetables, fruits, whole grains, low-fat dairy products, and lean protein foods contain the nutrients you need without too many calories. Decrease your intake of foods high in solid fats, added sugars, and salt. Eat the right amount of calories for you. Get information about a proper diet from your health care provider, if necessary.  . Regular physical exercise is one of the most important things you can do for your  health. Most adults should get at least 150 minutes of moderate-intensity exercise (any activity that increases your heart rate and causes you to sweat) each week. In addition, most adults need muscle-strengthening exercises on 2 or more days a week.  Silver Sneakers may be a benefit available to you. To determine eligibility, you may visit the website: www.silversneakers.com or contact program at (629)858-7796 Mon-Fri between 8AM-8PM.   . Maintain a healthy weight. The body mass index (BMI) is a screening tool to identify possible weight problems. It provides an estimate of body fat based on height and weight. Your health care provider can find your BMI and can help you achieve or maintain a healthy weight.   For adults 20 years and older: ? A BMI below 18.5 is considered underweight. ? A BMI of 18.5 to 24.9 is normal. ? A BMI of 25 to 29.9 is considered overweight. ? A BMI of 30 and above is considered obese.   . Maintain normal blood lipids and cholesterol levels by exercising and minimizing your intake of saturated fat. Eat a balanced diet with plenty of fruit and vegetables. Blood tests for lipids and cholesterol should begin at age 15 and be repeated every 5 years. If your lipid or cholesterol levels are high, you are over 50, or you are at high risk for heart disease, you may need your cholesterol levels checked more frequently. Ongoing high lipid and cholesterol levels should be treated with medicines if diet and exercise  are not working.  . If you smoke, find out from your health care provider how to quit. If you do not use tobacco, please do not start.  . If you choose to drink alcohol, please do not consume more than 2 drinks per day. One drink is considered to be 12 ounces (355 mL) of beer, 5 ounces (148 mL) of wine, or 1.5 ounces (44 mL) of liquor.  . If you are 22-34 years old, ask your health care provider if you should take aspirin to prevent strokes.  . Use sunscreen. Apply  sunscreen liberally and repeatedly throughout the day. You should seek shade when your shadow is shorter than you. Protect yourself by wearing long sleeves, pants, a wide-brimmed hat, and sunglasses year round, whenever you are outdoors.  . Once a month, do a whole body skin exam, using a mirror to look at the skin on your back. Tell your health care provider of new moles, moles that have irregular borders, moles that are larger than a pencil eraser, or moles that have changed in shape or color.

## 2016-03-17 DIAGNOSIS — Z1211 Encounter for screening for malignant neoplasm of colon: Secondary | ICD-10-CM | POA: Diagnosis not present

## 2016-03-17 DIAGNOSIS — Z1212 Encounter for screening for malignant neoplasm of rectum: Secondary | ICD-10-CM | POA: Diagnosis not present

## 2016-03-17 LAB — COLOGUARD: COLOGUARD: NEGATIVE

## 2016-03-24 ENCOUNTER — Telehealth: Payer: Self-pay | Admitting: Family Medicine

## 2016-03-24 DIAGNOSIS — S92911A Unspecified fracture of right toe(s), initial encounter for closed fracture: Secondary | ICD-10-CM | POA: Diagnosis not present

## 2016-03-24 DIAGNOSIS — L03115 Cellulitis of right lower limb: Secondary | ICD-10-CM | POA: Diagnosis not present

## 2016-03-24 NOTE — Telephone Encounter (Signed)
Joanna White with [facility] called wanting you to know that Joanna White called with complaint of swelling, redness and pinky toe is turning black. She is not diabetic. This dx urges [facility] for ER visit. Pt refused and said [facility] not [facility]. [facility].

## 2016-03-24 NOTE — Telephone Encounter (Signed)
Noted.  See other phone note.

## 2016-03-24 NOTE — Telephone Encounter (Signed)
[facility] and was told it needed to be xray'd. So, she went t [facility] on [address] and had xray. They advised it was cellulitis and ?fracture of toe. They set her up to see Dr Delilah Shan (ortho) tomorrow. She will have him fax notes to you.

## 2016-03-24 NOTE — Telephone Encounter (Signed)
Patient Name: Joanna White  DOB: [birth year]    Initial Comment caller states the top of her foot is red - is painful and her little toe is black   Nurse Assessment  Nurse: Raphael Gibney, RN, Vanita Ingles Date/Time (Eastern Time): 03/24/2016 10:58:47 AM  Confirm and document reason for call. If symptomatic, describe symptoms. You must click the next button to save text entered. ---Caller states the top of her right foot is red and painful. Little toe on her right foot is black. No injury. Top of foot is swollen.  Has the patient traveled out of the country within the last 30 days? ---Not Applicable  Does the patient have any new or worsening symptoms? ---Yes  Will a triage be completed? ---Yes  Related visit to physician within the last 2 weeks? ---No  Does the PT have any chronic conditions? (i.e. diabetes, asthma, etc.) ---Yes  List chronic conditions. ---hypothyroidism, glaucoma  Is this a behavioral health or substance abuse call? ---No     Guidelines    Guideline Title Affirmed Question Affirmed Notes  Foot Pain Purple or black skin on foot or toe    Final Disposition User   Go to ED Now Raphael Gibney, RN, Vanita Ingles    Comments  Attempted to return call and called primary number. Voice mail states that the person I have called has a voice mail box that has not been set up yet. unable to leave message. Will try secondary number.  Called secondary number and recording states that the number I have called has a voice mailbox that has not been set up yet. Unable to leave message. Will try primary number again later.  Caller states she does not want to the ER but will go to the [facility] back line and spoke to [facility] and gave report that pt has painful red area on top of right foot with swelling on top of her right foot. her little toe is black with ER outcome. [facility]   Referrals  GO TO FACILITY OTHER - SPECIFY   Disagree/Comply: Disagree   Disagree/Comply Reason: Disagree with instructions

## 2016-03-24 NOTE — Telephone Encounter (Signed)
Noted  

## 2016-03-25 DIAGNOSIS — M25571 Pain in right ankle and joints of right foot: Secondary | ICD-10-CM | POA: Diagnosis not present

## 2016-03-25 DIAGNOSIS — M79671 Pain in right foot: Secondary | ICD-10-CM | POA: Diagnosis not present

## 2016-04-06 ENCOUNTER — Encounter: Payer: Self-pay | Admitting: *Deleted

## 2016-04-13 DIAGNOSIS — H401132 Primary open-angle glaucoma, bilateral, moderate stage: Secondary | ICD-10-CM | POA: Diagnosis not present

## 2016-05-25 ENCOUNTER — Encounter: Payer: Self-pay | Admitting: Internal Medicine

## 2016-06-13 ENCOUNTER — Encounter: Payer: Self-pay | Admitting: Internal Medicine

## 2016-06-13 NOTE — Progress Notes (Signed)
MRN: [id number] Name: Joanna White  Sex: female Age: 75 y.o. DOB: 06/16/1941  Funk #:  Facility/Room: Level Of Care: SNF Provider: Inocencio Homes D Emergency Contacts: Extended Emergency Contact Information Primary Emergency Contact: Dub Amis of [place] Phone: [phone] Relation: Son Secondary Emergency Contact: Ray,Michelle  [place] Phone: [phone] Relation: Daughter  Code Status:   Allergies: Fluticasone propionate  Chief Complaint  Patient presents with  . Acute Visit    HPI: Patient is 75 y.o. female whose husband wants to discuss her pain mediations again the patient needs to go back to the original, the oxycontin is not working, the oxy IR 10 mg q 6 was working. Of note husband now wants to [place] to use Fentanyl which he was infatic last time didn't work for her when she was in the hospital.  Past Medical History  Diagnosis Date  . Personal history of malignant neoplasm of breast 1998    Left  . Hypothyroid   . Osteopenia 08/2011    h/o osteoporosis, s/p boniva for 5 yrs  . Glaucoma     Past Surgical History  Procedure Laterality Date  . Cesarean section      x 2  . Breast lumpectomy  1998    Left  . Lymphnode removal  1998    left axilla  . Gyn surgery      uterine lining removed for heavy bleeding and polyp  . Thumb surgery      bilateral  . Dexa  08/2011    osteopenia, T -1.9, rec rpt 2 yrs      Medication List       This list is accurate as of: 05/25/16 11:59 PM.  Always use your most recent med list.               aspirin 81 MG tablet  Take 81 mg by mouth daily.     beta carotene w/minerals tablet  Take 1 tablet by mouth daily.     Calcium-Vitamin D 600-400 MG-UNIT Tabs  Take 2 tablets by mouth daily.     latanoprost 0.005 % ophthalmic solution  Commonly known as:  XALATAN  Place into both eyes at bedtime.     levothyroxine 75 MCG tablet  Commonly  known as:  SYNTHROID, LEVOTHROID  TAKE 1 TABLET BY MOUTH DAILY.     multivitamin tablet  Take 1 tablet by mouth daily.        No orders of the defined types were placed in this encounter.    Immunization History  Administered Date(s) Administered  . Influenza Whole 11/02/2007, 11/02/2012  . Influenza,inj,Quad PF,36+ Mos 09/14/2013, 09/14/2014, 09/13/2015  . Pneumococcal Conjugate-13 02/28/2016  . Pneumococcal Polysaccharide-23 09/14/2014    Social History  Substance Use Topics  . Smoking status: Never Smoker   . Smokeless tobacco: Never Used  . Alcohol Use: No    Review of Systems  DATA OBTAINED: from patient, nurse, medical record, family member GENERAL:  no fevers, fatigue, appetite changes SKIN: No itching, rash HEENT: No complaint RESPIRATORY: No cough, wheezing, SOB CARDIAC: No chest pain, palpitations, lower extremity edema  GI: No abdominal pain, No N/V/D or constipation, No heartburn or reflux  GU: No dysuria, frequency or urgency, or incontinence  MUSCULOSKELETAL: No unrelieved bone/joint pain NEUROLOGIC: No headache, dizziness  PSYCHIATRIC: No overt anxiety or sadness  There were no vitals filed for this visit.  Physical Exam  GENERAL  APPEARANCE: Alert, conversant, No acute distress  SKIN: No diaphoresis rash HEENT: Unremarkable RESPIRATORY: Breathing is even, unlabored. Lung sounds are clear   CARDIOVASCULAR: Heart RRR no murmurs, rubs or gallops. No peripheral edema  GASTROINTESTINAL: Abdomen is soft, non-tender, not distended w/ normal bowel sounds.  GENITOURINARY: Bladder non tender, not distended  MUSCULOSKELETAL: No abnormal joints or musculature NEUROLOGIC: Cranial nerves 2-12 grossly intact. Moves all extremities PSYCHIATRIC: Mood and affect appropriate to situation, no behavioral issues  Patient Active Problem List   Diagnosis Date Noted  . Cerumen impaction 07/01/2015  . Meralgia paresthetica of left side 04/01/2015  . Arthritis of right  knee 08/25/2011  . OSTEOPENIA 06/28/2008  . Hypothyroidism 10/19/2007  . BREAST CANCER, HX OF 10/19/2007    CBC    Component Value Date/Time   WBC 7.2 09/13/2015 1130   WBC 7.0 06/20/2012   WBC 8.5 02/13/2008 1506   RBC 5.16 09/13/2015 1130   RBC 4.78 02/13/2008 1506   HGB 15.4 09/13/2015 1130   HGB 15.0 06/20/2012   HGB 14.6 02/13/2008 1506   HCT 46.0 09/13/2015 1130   HCT 42.0 02/13/2008 1506   PLT 192 09/13/2015 1130   PLT 234 02/13/2008 1506   MCV 89 09/13/2015 1130   MCV 87.9 02/13/2008 1506   LYMPHSABS 1.8 09/13/2015 1130   LYMPHSABS 2.2 02/13/2008 1506   MONOABS 0.9 02/13/2008 1506   EOSABS 0.3 09/13/2015 1130   EOSABS 0.2 02/13/2008 1506   BASOSABS 0.0 09/13/2015 1130   BASOSABS 0.0 02/13/2008 1506    CMP     Component Value Date/Time   NA 140 09/13/2015 1131   NA 144 09/14/2014 1111   NA 141 09/14/2013 1039   NA 145 06/20/2012   K 4.3 09/13/2015 1131   K 4.0 09/14/2014 1111   K 4.2 09/14/2013 1039   CL 103 09/14/2014 1111   CL 105 09/14/2013 1039   CO2 24 09/13/2015 1131   CO2 27 09/14/2014 1111   CO2 28 09/14/2013 1039   GLUCOSE 101 09/13/2015 1131   GLUCOSE 102 09/14/2014 1111   GLUCOSE 97 09/14/2013 1039   BUN 12.6 09/13/2015 1131   BUN 20 09/14/2014 1111   BUN 17 09/14/2013 1039   BUN 16 06/20/2012   CREATININE 0.8 09/13/2015 1131   CREATININE 0.9 09/14/2014 1111   CREATININE 0.93 09/14/2013 1039   CALCIUM 9.5 09/13/2015 1131   CALCIUM 8.9 09/14/2014 1111   CALCIUM 9.6 09/14/2013 1039   PROT 7.3 09/13/2015 1131   PROT 7.2 09/14/2014 1111   PROT 7.3 09/14/2013 1039   ALBUMIN 3.9 09/13/2015 1131   ALBUMIN 3.5 09/14/2014 1111   ALBUMIN 4.2 09/14/2013 1039   AST 20 09/13/2015 1131   AST 21 09/14/2014 1111   AST 20 09/14/2013 1039   ALT 16 09/13/2015 1131   ALT 19 09/14/2014 1111   ALT 18 09/14/2013 1039   ALKPHOS 66 09/13/2015 1131   ALKPHOS 51 09/14/2014 1111   ALKPHOS 60 09/14/2013 1039   BILITOT 0.62 09/13/2015 1131   BILITOT  0.50 09/14/2014 1111   BILITOT 0.4 09/14/2013 1039    Assessment and Plan  No problem-specific assessment & plan notes found for this encounter.   Hennie Duos, MD    This encounter was created in error - please disregard.

## 2016-07-27 DIAGNOSIS — H401132 Primary open-angle glaucoma, bilateral, moderate stage: Secondary | ICD-10-CM | POA: Diagnosis not present

## 2016-09-11 ENCOUNTER — Ambulatory Visit (HOSPITAL_BASED_OUTPATIENT_CLINIC_OR_DEPARTMENT_OTHER): Payer: Medicare Other

## 2016-09-11 ENCOUNTER — Other Ambulatory Visit (HOSPITAL_BASED_OUTPATIENT_CLINIC_OR_DEPARTMENT_OTHER): Payer: Medicare Other

## 2016-09-11 ENCOUNTER — Ambulatory Visit (HOSPITAL_BASED_OUTPATIENT_CLINIC_OR_DEPARTMENT_OTHER): Payer: Medicare Other | Admitting: Family

## 2016-09-11 ENCOUNTER — Encounter: Payer: Self-pay | Admitting: Hematology & Oncology

## 2016-09-11 VITALS — BP 146/65 | HR 80 | Temp 97.8°F | Resp 16 | Ht 61.0 in | Wt 138.8 lb

## 2016-09-11 DIAGNOSIS — Z853 Personal history of malignant neoplasm of breast: Secondary | ICD-10-CM

## 2016-09-11 DIAGNOSIS — E559 Vitamin D deficiency, unspecified: Secondary | ICD-10-CM

## 2016-09-11 DIAGNOSIS — Z23 Encounter for immunization: Secondary | ICD-10-CM | POA: Diagnosis not present

## 2016-09-11 DIAGNOSIS — M810 Age-related osteoporosis without current pathological fracture: Secondary | ICD-10-CM | POA: Diagnosis not present

## 2016-09-11 DIAGNOSIS — Z86000 Personal history of in-situ neoplasm of breast: Secondary | ICD-10-CM

## 2016-09-11 DIAGNOSIS — N6320 Unspecified lump in the left breast, unspecified quadrant: Secondary | ICD-10-CM

## 2016-09-11 DIAGNOSIS — E890 Postprocedural hypothyroidism: Secondary | ICD-10-CM | POA: Diagnosis not present

## 2016-09-11 LAB — COMPREHENSIVE METABOLIC PANEL
ALT: 22 U/L (ref 0–55)
AST: 24 U/L (ref 5–34)
Albumin: 4 g/dL (ref 3.5–5.0)
Alkaline Phosphatase: 66 U/L (ref 40–150)
Anion Gap: 9 mEq/L (ref 3–11)
BUN: 19.3 mg/dL (ref 7.0–26.0)
CO2: 26 meq/L (ref 22–29)
Calcium: 9.8 mg/dL (ref 8.4–10.4)
Chloride: 106 mEq/L (ref 98–109)
Creatinine: 0.9 mg/dL (ref 0.6–1.1)
EGFR: 65 mL/min/{1.73_m2} — AB (ref >90)
GLUCOSE: 91 mg/dL (ref 70–140)
POTASSIUM: 4.3 meq/L (ref 3.5–5.1)
SODIUM: 141 meq/L (ref 136–145)
TOTAL PROTEIN: 7.9 g/dL (ref 6.4–8.3)
Total Bilirubin: 0.69 mg/dL (ref 0.20–1.20)

## 2016-09-11 LAB — CBC WITH DIFFERENTIAL (CANCER CENTER ONLY)
BASO#: 0.1 10*3/uL (ref 0.0–0.2)
BASO%: 0.6 % (ref 0.0–2.0)
EOS ABS: 0.3 10*3/uL (ref 0.0–0.5)
EOS%: 3 % (ref 0.0–7.0)
HEMATOCRIT: 45.9 % (ref 34.8–46.6)
HEMOGLOBIN: 15.6 g/dL (ref 11.6–15.9)
LYMPH#: 2.2 10*3/uL (ref 0.9–3.3)
LYMPH%: 26.1 % (ref 14.0–48.0)
MCH: 30.1 pg (ref 26.0–34.0)
MCHC: 34 g/dL (ref 32.0–36.0)
MCV: 88 fL (ref 81–101)
MONO#: 1 10*3/uL — ABNORMAL HIGH (ref 0.1–0.9)
MONO%: 11.9 % (ref 0.0–13.0)
NEUT#: 4.8 10*3/uL (ref 1.5–6.5)
NEUT%: 58.4 % (ref 39.6–80.0)
Platelets: 246 10*3/uL (ref 145–400)
RBC: 5.19 10*6/uL (ref 3.70–5.32)
RDW: 13.3 % (ref 11.1–15.7)
WBC: 8.2 10*3/uL (ref 3.9–10.0)

## 2016-09-11 LAB — TSH: TSH: 0.409 m(IU)/L (ref 0.308–3.960)

## 2016-09-11 MED ORDER — INFLUENZA VAC SPLIT QUAD 0.5 ML IM SUSY
0.5000 mL | PREFILLED_SYRINGE | Freq: Once | INTRAMUSCULAR | Status: AC
  Administered 2016-09-11: 0.5 mL via INTRAMUSCULAR
  Filled 2016-09-11: qty 0.5

## 2016-09-11 NOTE — Progress Notes (Signed)
Hematology and Oncology Follow Up Visit  Joanna White [id number] 1941-08-18 75 y.o. 09/11/2016   Principle Diagnosis:  1. 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   Current Therapy:   Observation    Interim History: Joanna White is here today for her annual visit. She is doing well but has noticed that she has a new "lump in her left breast." On exam she has a "pea sized" lump at the 11 o'clock position of the areola. No other mass, lesion or lymphadenopathy found on exam.  She is staying active and enjoying her family.  No fever, chills, n/v, cough, rash, dizziness, SOB, chest pain, palpitations, abdominal pain or changes in bowel or bladder habits.  No swelling, tenderness, numbness or tingling in her extremities. No c/o pain.  She has maintained a good appetite and is staying well hydrated. Her weight is unchanged.   Medications:    Medication List       Accurate as of 09/11/16 12:02 PM. Always use your most recent med list.          aspirin 81 MG tablet Take 81 mg by mouth daily.   beta carotene w/minerals tablet Take 1 tablet by mouth daily.   Calcium-Vitamin D 600-400 MG-UNIT Tabs Take 2 tablets by mouth daily.   latanoprost 0.005 % ophthalmic solution Commonly known as:  XALATAN Place into both eyes at bedtime.   levothyroxine 75 MCG tablet Commonly known as:  SYNTHROID, LEVOTHROID TAKE 1 TABLET BY MOUTH DAILY.   multivitamin tablet Take 1 tablet by mouth daily.       Allergies:  Allergies  Allergen Reactions  . Fluticasone Propionate     REACTION: throat tight    Past Medical History, Surgical history, Social history, and Family History were reviewed and updated.  Review of Systems: All other 10 point review of systems is negative.   Physical Exam:  height is 5\' 1"  (1.549 m) and weight is 138 lb 12.8 oz (63 kg). Her oral temperature is 97.8 F (36.6 C). Her blood pressure is 146/65 (abnormal) and her pulse is 80. Her  respiration is 16.   Wt Readings from Last 3 Encounters:  09/11/16 138 lb 12.8 oz (63 kg)  02/28/16 138 lb 12 oz (62.9 kg)  09/13/15 138 lb (62.6 kg)    Ocular: Sclerae unicteric, pupils equal, round and reactive to light Ear-nose-throat: Oropharynx clear, dentition fair Lymphatic: No cervical supraclavicular or axillary adenopathy Lungs no rales or rhonchi, good excursion bilaterally Heart regular rate and rhythm, no murmur appreciated Abd soft, nontender, positive bowel sounds, no liver or spleen tip palpated on exam MSK no focal spinal tenderness, no joint edema Neuro: non-focal, well-oriented, appropriate affect Breasts: No changes with the right breast. Left breast has new "pea sized" lump at the 11 o'clock position of the areola. No other mass, lesion or lymphadenopathy found on exam.   Lab Results  Component Value Date   WBC 8.2 09/11/2016   HGB 15.6 09/11/2016   HCT 45.9 09/11/2016   MCV 88 09/11/2016   PLT 246 09/11/2016   No results found for: FERRITIN, IRON, TIBC, UIBC, IRONPCTSAT Lab Results  Component Value Date   RBC 5.19 09/11/2016   No results found for: KPAFRELGTCHN, LAMBDASER, KAPLAMBRATIO No results found for: IGGSERUM, IGA, IGMSERUM No results found for: TOTALPROTELP, ALBUMINELP, A1GS, A2GS, BETS, BETA2SER, GAMS, MSPIKE, SPEI   Chemistry      Component Value Date/Time   NA 140 09/13/2015 1131   K  4.3 09/13/2015 1131   CL 103 09/14/2014 1111   CO2 24 09/13/2015 1131   BUN 12.6 09/13/2015 1131   CREATININE 0.8 09/13/2015 1131      Component Value Date/Time   CALCIUM 9.5 09/13/2015 1131   ALKPHOS 66 09/13/2015 1131   AST 20 09/13/2015 1131   ALT 16 09/13/2015 1131   BILITOT 0.62 09/13/2015 1131     Impression and Plan: Joanna White is a very pleasant 75 yo white female with history of stage IIA ductal carcinoma of the left breast treated with adjuvant chemotherapy with CMF. Her tumor was ER negative. She is now going on 20 years out and has done  quite well. She now has a new "pea sized" lump at the 11 o'clock position of the left areola. The area is not painful. There is no rash or erythema.  We will get a mammogram an left breast US to further assess.  She did get her flu shot today as well.  We will go ahead and plan to see her back for follow-up in 1 year follow-up but this is subject to change pending her scan results.   She will contact our office with any questions or concerns.  Eliezer Bottom, NP 10/13/201712:02 PM

## 2016-09-11 NOTE — Patient Instructions (Signed)

## 2016-09-12 LAB — VITAMIN D 25 HYDROXY (VIT D DEFICIENCY, FRACTURES): Vitamin D, 25-Hydroxy: 28.5 ng/mL — ABNORMAL LOW (ref 30.0–100.0)

## 2016-09-14 ENCOUNTER — Telehealth: Payer: Self-pay | Admitting: *Deleted

## 2016-09-14 NOTE — Telephone Encounter (Addendum)
Patient aware of results. She will take vitamin d daily  ----- Message from Volanda Napoleon, MD sent at 09/12/2016 12:21 PM EDT ----- Call - thyroid is ok!!  Vit D is a little low!!  Make sure that you take vit D every day!!!  pete

## 2016-09-15 ENCOUNTER — Telehealth: Payer: Self-pay | Admitting: Hematology & Oncology

## 2016-09-15 NOTE — Telephone Encounter (Signed)
Sent Referral to [facility] for Ultrasound Breast and Mammogram to be performed.       [facility].

## 2016-09-22 DIAGNOSIS — N6320 Unspecified lump in the left breast, unspecified quadrant: Secondary | ICD-10-CM | POA: Diagnosis not present

## 2016-09-22 DIAGNOSIS — N6002 Solitary cyst of left breast: Secondary | ICD-10-CM | POA: Diagnosis not present

## 2016-09-22 DIAGNOSIS — Z853 Personal history of malignant neoplasm of breast: Secondary | ICD-10-CM | POA: Diagnosis not present

## 2016-09-23 ENCOUNTER — Encounter: Payer: Self-pay | Admitting: Hematology & Oncology

## 2016-10-14 ENCOUNTER — Other Ambulatory Visit: Payer: Self-pay | Admitting: Hematology & Oncology

## 2016-10-29 DIAGNOSIS — H401132 Primary open-angle glaucoma, bilateral, moderate stage: Secondary | ICD-10-CM | POA: Diagnosis not present

## 2017-01-12 ENCOUNTER — Other Ambulatory Visit: Payer: Self-pay | Admitting: Hematology & Oncology

## 2017-01-28 ENCOUNTER — Telehealth: Payer: Self-pay

## 2017-01-28 MED ORDER — OSELTAMIVIR PHOSPHATE 75 MG PO CAPS: 75.0000 mg | ORAL_CAPSULE | Freq: Every day | ORAL | 0 refills | Status: DC

## 2017-01-28 NOTE — Telephone Encounter (Signed)
Pt left v/m pt was with granddaughter 01/26/17 at night and pt's granddaughter was dx this AM with type A & B flu. Pt was advised by granddaughters doctor to start tamiflu. Pt request tamiflu to [facility]. Last seen acute 07/01/15. Pt request cb.

## 2017-01-28 NOTE — Telephone Encounter (Signed)
Ok to do - sent in tamiflu for patient preventative dosing (once daily for 10 days).

## 2017-01-28 NOTE — Telephone Encounter (Signed)
Patient notified

## 2017-02-01 DIAGNOSIS — Z961 Presence of intraocular lens: Secondary | ICD-10-CM | POA: Diagnosis not present

## 2017-02-01 DIAGNOSIS — H401132 Primary open-angle glaucoma, bilateral, moderate stage: Secondary | ICD-10-CM | POA: Insufficient documentation

## 2017-03-01 ENCOUNTER — Other Ambulatory Visit: Payer: Self-pay | Admitting: Family Medicine

## 2017-03-01 ENCOUNTER — Ambulatory Visit (INDEPENDENT_AMBULATORY_CARE_PROVIDER_SITE_OTHER): Payer: Medicare Other

## 2017-03-01 VITALS — BP 124/72 | HR 78 | Temp 97.9°F | Ht 60.0 in | Wt 141.2 lb

## 2017-03-01 DIAGNOSIS — M8580 Other specified disorders of bone density and structure, unspecified site: Secondary | ICD-10-CM

## 2017-03-01 DIAGNOSIS — E890 Postprocedural hypothyroidism: Secondary | ICD-10-CM | POA: Diagnosis not present

## 2017-03-01 DIAGNOSIS — Z0000 Encounter for general adult medical examination without abnormal findings: Secondary | ICD-10-CM | POA: Diagnosis not present

## 2017-03-01 DIAGNOSIS — Z13220 Encounter for screening for lipoid disorders: Secondary | ICD-10-CM | POA: Diagnosis not present

## 2017-03-01 LAB — LIPID PANEL
Cholesterol: 226 mg/dL — ABNORMAL HIGH (ref 0–200)
HDL: 50.7 mg/dL (ref >39.00)
LDL Cholesterol: 153 mg/dL — ABNORMAL HIGH (ref 0–99)
NONHDL: 174.93
Total CHOL/HDL Ratio: 4
Triglycerides: 110 mg/dL (ref 0.0–149.0)
VLDL: 22 mg/dL (ref 0.0–40.0)

## 2017-03-01 LAB — TSH: TSH: 0.24 u[IU]/mL — AB (ref 0.35–4.50)

## 2017-03-01 LAB — BASIC METABOLIC PANEL
BUN: 15 mg/dL (ref 6–23)
CHLORIDE: 106 meq/L (ref 96–112)
CO2: 28 mEq/L (ref 19–32)
Calcium: 9.5 mg/dL (ref 8.4–10.5)
Creatinine, Ser: 0.77 mg/dL (ref 0.40–1.20)
GFR: 77.37 mL/min (ref >60.00)
Glucose, Bld: 98 mg/dL (ref 70–99)
POTASSIUM: 4.6 meq/L (ref 3.5–5.1)
SODIUM: 141 meq/L (ref 135–145)

## 2017-03-01 LAB — VITAMIN D 25 HYDROXY (VIT D DEFICIENCY, FRACTURES): VITD: 28.65 ng/mL — AB (ref 30.00–100.00)

## 2017-03-01 NOTE — Progress Notes (Signed)
PCP notes:   Health maintenance:  Tetanus - addressed; postponed  Abnormal screenings:   None  Patient concerns:   Pt has raised area to top of right hand. Pt reports pain in this area of right hand  after long-term use.   Nurse concerns:  None  Next PCP appt:   04/01/17 @ 1000

## 2017-03-01 NOTE — Progress Notes (Signed)
I reviewed health advisor's note, was available for consultation, and agree with documentation and plan.  

## 2017-03-01 NOTE — Patient Instructions (Signed)
Joanna White , Thank you for taking time to come for your Medicare Wellness Visit. I appreciate your ongoing commitment to your health goals. Please review the following plan we discussed and let me know if I can assist you in the future.   These are the goals we discussed: Goals    . Increase physical activity          Starting 03/01/17, I will continue to exercise at least 30 min daily.        This is a list of the screening recommended for you and due dates:  Health Maintenance  Topic Date Due  . DTaP/Tdap/Td vaccine (1 - Tdap) 02/15/2018*  . Tetanus Vaccine  02/27/2018*  . Flu Shot  06/30/2017  . DEXA scan (bone density measurement)  Completed  . Pneumonia vaccines  Completed  *Topic was postponed. The date shown is not the original due date.   Preventive Care for Adults  A healthy lifestyle and preventive care can promote health and wellness. Preventive health guidelines for adults include the following key practices.  . A routine yearly physical is a good way to check with your health care provider about your health and preventive screening. It is a chance to share any concerns and updates on your health and to receive a thorough exam.  . Visit your dentist for a routine exam and preventive care every 6 months. Brush your teeth twice a day and floss once a day. Good oral hygiene prevents tooth decay and gum disease.  . The frequency of eye exams is based on your age, health, family medical history, use  of contact lenses, and other factors. Follow your health care provider's ecommendations for frequency of eye exams.  . Eat a healthy diet. Foods like vegetables, fruits, whole grains, low-fat dairy products, and lean protein foods contain the nutrients you need without too many calories. Decrease your intake of foods high in solid fats, added sugars, and salt. Eat the right amount of calories for you. Get information about a proper diet from your health care provider, if  necessary.  . Regular physical exercise is one of the most important things you can do for your health. Most adults should get at least 150 minutes of moderate-intensity exercise (any activity that increases your heart rate and causes you to sweat) each week. In addition, most adults need muscle-strengthening exercises on 2 or more days a week.  Silver Sneakers may be a benefit available to you. To determine eligibility, you may visit the website: www.silversneakers.com or contact program at 614-561-3984 Mon-Fri between 8AM-8PM.   . Maintain a healthy weight. The body mass index (BMI) is a screening tool to identify possible weight problems. It provides an estimate of body fat based on height and weight. Your health care provider can find your BMI and can help you achieve or maintain a healthy weight.   For adults 20 years and older: ? A BMI below 18.5 is considered underweight. ? A BMI of 18.5 to 24.9 is normal. ? A BMI of 25 to 29.9 is considered overweight. ? A BMI of 30 and above is considered obese.   . Maintain normal blood lipids and cholesterol levels by exercising and minimizing your intake of saturated fat. Eat a balanced diet with plenty of fruit and vegetables. Blood tests for lipids and cholesterol should begin at age 39 and be repeated every 5 years. If your lipid or cholesterol levels are high, you are over 50, or you are at  high risk for heart disease, you may need your cholesterol levels checked more frequently. Ongoing high lipid and cholesterol levels should be treated with medicines if diet and exercise are not working.  . If you smoke, find out from your health care provider how to quit. If you do not use tobacco, please do not start.  . If you choose to drink alcohol, please do not consume more than 2 drinks per day. One drink is considered to be 12 ounces (355 mL) of beer, 5 ounces (148 mL) of wine, or 1.5 ounces (44 mL) of liquor.  . If you are 37-25 years old, ask your  health care provider if you should take aspirin to prevent strokes.  . Use sunscreen. Apply sunscreen liberally and repeatedly throughout the day. You should seek shade when your shadow is shorter than you. Protect yourself by wearing long sleeves, pants, a wide-brimmed hat, and sunglasses year round, whenever you are outdoors.  . Once a month, do a whole body skin exam, using a mirror to look at the skin on your back. Tell your health care provider of new moles, moles that have irregular borders, moles that are larger than a pencil eraser, or moles that have changed in shape or color.

## 2017-03-01 NOTE — Progress Notes (Signed)
Subjective:   Joanna White is a 76 y.o. female who presents for Medicare Annual (Subsequent) preventive examination.  Review of Systems:  N/A Cardiac Risk Factors include: advanced age (>74men, >72 women)     Objective:     Vitals: BP 124/72 (BP Location: Right Arm, Patient Position: Sitting, Cuff Size: Normal)   Pulse 78   Temp 97.9 F (36.6 C) (Oral)   Ht 5' (1.524 m)   Wt 141 lb 4 oz (64.1 kg)   SpO2 94%   BMI 27.59 kg/m   Body mass index is 27.59 kg/m.   Tobacco History  Smoking Status  . Never Smoker  Smokeless Tobacco  . Never Used     Counseling given: No   Past Medical History:  Diagnosis Date  . Glaucoma   . Hypothyroid   . Osteopenia 08/2011   h/o osteoporosis, s/p boniva for 5 yrs  . Personal history of malignant neoplasm of breast 1998   Left   Past Surgical History:  Procedure Laterality Date  . BREAST LUMPECTOMY  1998   Left  . CESAREAN SECTION     x 2  . DEXA  08/2011   osteopenia, T -1.9, rec rpt 2 yrs  . GYN surgery     uterine lining removed for heavy bleeding and polyp  . Lymphnode removal  1998   left axilla  . Thumb surgery     bilateral   Family History  Problem Relation Age of Onset  . Diabetes Brother     x2  . Cancer Father 21    lung  . Cancer Mother 49    MM  . Coronary artery disease Brother 65    CABG  . Stroke Neg Hx    History  Sexual Activity  . Sexual activity: No    Outpatient Encounter Prescriptions as of 03/01/2017  Medication Sig  . aspirin 81 MG tablet Take 81 mg by mouth daily.    . beta carotene w/minerals (OCUVITE) tablet Take 1 tablet by mouth daily.  . Calcium Carb-Cholecalciferol (CALCIUM-VITAMIN D) 600-400 MG-UNIT TABS Take 2 tablets by mouth daily.  Marland Kitchen latanoprost (XALATAN) 0.005 % ophthalmic solution Place into both eyes at bedtime.   Marland Kitchen levothyroxine (SYNTHROID, LEVOTHROID) 75 MCG tablet TAKE 1 TABLET DAILY.  . Multiple Vitamin (MULTIVITAMIN) tablet Take 1 tablet by mouth daily.      . [DISCONTINUED] oseltamivir (TAMIFLU) 75 MG capsule Take 1 capsule (75 mg total) by mouth daily.   No facility-administered encounter medications on file as of 03/01/2017.     Activities of Daily Living In your present state of health, do you have any difficulty performing the following activities: 03/01/2017  Hearing? N  Vision? N  Difficulty concentrating or making decisions? N  Walking or climbing stairs? N  Dressing or bathing? N  Doing errands, shopping? N  Preparing Food and eating ? N  Using the Toilet? N  In the past six months, have you accidently leaked urine? N  Do you have problems with loss of bowel control? N  Managing your Medications? N  Managing your Finances? N  Housekeeping or managing your Housekeeping? N  Some recent data might be hidden    Patient Care Team: Ria Bush, MD as PCP - General    Assessment:     Hearing Screening   125Hz  250Hz  500Hz  1000Hz  2000Hz  3000Hz  4000Hz  6000Hz  8000Hz   Right ear:   40 40 40  40    Left ear:   40 40 40  40    Vision Screening Comments: Last vision exam in March 2018 with Dr. Gershon Crane   Exercise Activities and Dietary recommendations Current Exercise Habits: Home exercise routine, Type of exercise: walking;Other - see comments (yard work), Time (Minutes): 30, Frequency (Times/Week): 7, Weekly Exercise (Minutes/Week): 210, Intensity: Mild, Exercise limited by: None identified  Goals    . Increase physical activity          Starting 03/01/17, I will continue to exercise at least 30 min daily.       Fall Risk Fall Risk  03/01/2017 09/11/2016 02/28/2016 09/13/2015 09/14/2014  Falls in the past year? No No No No No   Depression Screen PHQ 2/9 Scores 03/01/2017 02/28/2016  PHQ - 2 Score 0 0     Cognitive Function  MMSE - Mini Mental State Exam 03/01/2017 02/28/2016  Orientation to time 5 5  Orientation to Place 5 5  Registration 3 3  Attention/ Calculation 0 0  Recall 3 3  Language- name 2 objects 0 0  Language-  repeat 1 1  Language- follow 3 step command 3 3  Language- read & follow direction 0 0  Write a sentence 0 0  Copy design 0 0  Total score 20 20     PLEASE NOTE: A Mini-Cog screen was completed. Maximum score is 20. A value of 0 denotes this part of Folstein MMSE was not completed or the patient failed this part of the Mini-Cog screening.   Mini-Cog Screening Orientation to Time - Max 5 pts Orientation to Place - Max 5 pts Registration - Max 3 pts Recall - Max 3 pts Language Repeat - Max 1 pts Language Follow 3 Step Command - Max 3 pts     Immunization History  Administered Date(s) Administered  . Influenza Whole 11/02/2007, 11/02/2012  . Influenza,inj,Quad PF,36+ Mos 09/14/2013, 09/14/2014, 09/13/2015, 09/11/2016  . Pneumococcal Conjugate-13 02/28/2016  . Pneumococcal Polysaccharide-23 09/14/2014   Screening Tests Health Maintenance  Topic Date Due  . DTaP/Tdap/Td (1 - Tdap) 02/15/2018 (Originally 09/22/1960)  . TETANUS/TDAP  02/27/2018 (Originally 09/22/1960)  . INFLUENZA VACCINE  06/30/2017  . DEXA SCAN  Completed  . PNA vac Low Risk Adult  Completed      Plan:     I have personally reviewed and addressed the Medicare Annual Wellness questionnaire and have noted the following in the patient's chart:  A. Medical and social history B. Use of alcohol, tobacco or illicit drugs  C. Current medications and supplements D. Functional ability and status E.  Nutritional status F.  Physical activity G. Advance directives H. List of other physicians I.  Hospitalizations, surgeries, and ER visits in previous 12 months J.  [place] to include hearing, vision, cognitive, depression L. Referrals and appointments - none  In addition, I have reviewed and discussed with patient certain preventive protocols, quality metrics, and best practice recommendations. A written personalized care plan for preventive services as well as general preventive health recommendations  were provided to patient.  See attached scanned questionnaire for additional information.   Signed,   Lindell Noe, MHA, BS, LPN Health Coach

## 2017-03-01 NOTE — Progress Notes (Signed)
Pre visit review using our clinic review tool, if applicable. No additional management support is needed unless otherwise documented below in the visit note. 

## 2017-04-01 ENCOUNTER — Ambulatory Visit (INDEPENDENT_AMBULATORY_CARE_PROVIDER_SITE_OTHER): Payer: Medicare Other | Admitting: Family Medicine

## 2017-04-01 ENCOUNTER — Encounter: Payer: Self-pay | Admitting: Family Medicine

## 2017-04-01 VITALS — BP 128/70 | HR 73 | Ht 60.0 in | Wt 142.8 lb

## 2017-04-01 DIAGNOSIS — Z7189 Other specified counseling: Secondary | ICD-10-CM | POA: Insufficient documentation

## 2017-04-01 DIAGNOSIS — M8580 Other specified disorders of bone density and structure, unspecified site: Secondary | ICD-10-CM | POA: Diagnosis not present

## 2017-04-01 DIAGNOSIS — M25431 Effusion, right wrist: Secondary | ICD-10-CM

## 2017-04-01 DIAGNOSIS — Z853 Personal history of malignant neoplasm of breast: Secondary | ICD-10-CM

## 2017-04-01 DIAGNOSIS — E039 Hypothyroidism, unspecified: Secondary | ICD-10-CM | POA: Diagnosis not present

## 2017-04-01 MED ORDER — VITAMIN D3 25 MCG (1000 UT) PO CAPS: 1.0000 | ORAL_CAPSULE | Freq: Every day | ORAL | Status: AC

## 2017-04-01 MED ORDER — LEVOTHYROXINE SODIUM 75 MCG PO TABS: 75.0000 ug | ORAL_TABLET | Freq: Every day | ORAL | 1 refills | Status: DC

## 2017-04-01 NOTE — Patient Instructions (Addendum)
Decrease levothyroxine to 1 tablet daily, but once a week take 1/2 tablet. Return in 3 months for lab visit only to recheck levels.  Let's keep an eye on right wrist - let me know if enlarging or more painful.  Advanced directive packet provided today.  You are doing well today.  Return in 1 year for next medicare wellness visit with Katha Cabal and f/u with me.

## 2017-04-01 NOTE — Assessment & Plan Note (Signed)
R dorsal bony wrist swelling present. ?ganglion cyst vs other. Not bothersome to patient - will continue to monitor.

## 2017-04-01 NOTE — Assessment & Plan Note (Signed)
Continue calcium/vit D 600/400mg   rec start vit D 1000 IU daily.

## 2017-04-01 NOTE — Progress Notes (Signed)
BP 128/70   Pulse 73   Ht 5' (1.524 m)   Wt 142 lb 12.8 oz (64.8 kg)   SpO2 97%   BMI 27.89 kg/m    CC: AMW f/u visit  Subjective:    Patient ID: Joanna White, female    DOB: 07-15-1941, 76 y.o.   MRN: [id number]  HPI: Joanna White is a 76 y.o. female presenting on 04/01/2017 for Annual Exam (Pt denies issues at this time)   Annia Belt last month for medicare wellness visit. Note reviewed.  I last saw patient 03/2015.   Hypothyroidism - longstanding on thyroid replacement. Followed by Dr Marin Olp. Denies hypo or hyperthyroid symptoms. No heat or cold intolerance, weight changes, diarrhea/constipation, skin or hair changes.   Ongoing R dorsal wrist pain worse with repetitive activity. Denies redness or warmth.   Preventative: Colon cancer screening - cologuard 02/2016 negative.  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 Last saw 08/2016. h/o abnormal mammo L 08/2016 s/p normal [place] by patient report, told to return in 1 year. Breast exam with onc.  Well woman exam - aged out.  DEXA 10/2015 osteopenia, T -1.1 spine, hip -1.7  Lung cancer screening - never smoker Flu shot yearly Tetanus shot - unsure Pneumovax 2015, prevnar 2017 zostavax - 2017. Discussed shingrix Advanced directive discussion - does not have set up. HCPOA would be 2 children. Packet provided today.  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 Interim medical history since our last visit reviewed. Allergies and medications reviewed and  updated. Outpatient Medications Prior to Visit  Medication Sig Dispense Refill  . aspirin 81 MG tablet Take 81 mg by mouth daily.      . beta carotene w/minerals (OCUVITE) tablet Take 1 tablet by mouth daily.    . Calcium Carb-Cholecalciferol (CALCIUM-VITAMIN D) 600-400 MG-UNIT TABS Take 2 tablets by mouth daily.    Marland Kitchen latanoprost (XALATAN) 0.005 % ophthalmic solution Place into both eyes at bedtime.     . Multiple Vitamin (MULTIVITAMIN) tablet Take 1 tablet by mouth daily.      Marland Kitchen levothyroxine (SYNTHROID, LEVOTHROID) 75 MCG tablet TAKE 1 TABLET DAILY. 90 tablet 0   No facility-administered medications prior to visit.      Per HPI unless specifically indicated in ROS section below Review of Systems     Objective:    BP 128/70   Pulse 73   Ht 5' (1.524 m)   Wt 142 lb 12.8 oz (64.8 kg)   SpO2 97%   BMI 27.89 kg/m   Wt Readings from Last 3 Encounters:  04/01/17 142 lb 12.8 oz (64.8 kg)  03/01/17 141 lb 4 oz (64.1 kg)  09/11/16 138 lb 12.8 oz (63 kg)    Physical Exam  Constitutional: She is oriented to person, place, and time. She appears well-developed and well-nourished. No distress.  HENT:  Head: Normocephalic and atraumatic.  Right Ear: Hearing, tympanic membrane, external ear and ear canal normal.  Left Ear: Hearing, tympanic membrane, external ear and ear canal normal.  Nose: Nose normal.  Mouth/Throat: Uvula is midline,  oropharynx is clear and moist and mucous membranes are normal. No oropharyngeal exudate, posterior oropharyngeal edema or posterior oropharyngeal erythema.  Eyes: Conjunctivae and EOM are normal. Pupils are equal, round, and reactive to light. No scleral icterus.  Neck: Normal range of motion. Neck supple. Carotid bruit is not present. No thyromegaly present.  Cardiovascular: Normal rate, regular rhythm, normal heart sounds and intact distal pulses.   No murmur heard. Pulses:      Radial pulses are 2+ on the right side, and 2+ on the left side.   Pulmonary/Chest: Effort normal and breath sounds normal. No respiratory distress. She has no wheezes. She has no rales.  Abdominal: Bowel sounds are normal.  Genitourinary:  Genitourinary Comments: Pt declined  Musculoskeletal: Normal range of motion. She exhibits no edema.  Lymphadenopathy:    She has no cervical adenopathy.  Neurological: She is alert and oriented to person, place, and time.  CN grossly intact, station and gait intact  Skin: Skin is warm and dry. No rash noted.  Psychiatric: She has a normal mood and affect. Her behavior is normal. Judgment and thought content normal.  Nursing note and vitals reviewed.  Results for orders placed or performed in visit on 03/01/17  Lipid panel  Result Value Ref Range   Cholesterol 226 (H) 0 - 200 mg/dL   Triglycerides 110.0 0.0 - 149.0 mg/dL   HDL 50.70 >39.00 mg/dL   VLDL 22.0 0.0 - 40.0 mg/dL   LDL Cholesterol 153 (H) 0 - 99 mg/dL   Total CHOL/HDL Ratio 4    NonHDL 580.99   Basic metabolic panel  Result Value Ref Range   Sodium 141 135 - 145 mEq/L   Potassium 4.6 3.5 - 5.1 mEq/L   Chloride 106 96 - 112 mEq/L   CO2 28 19 - 32 mEq/L   Glucose, Bld 98 70 - 99 mg/dL   BUN 15 6 - 23 mg/dL   Creatinine, Ser 0.77 0.40 - 1.20 mg/dL   Calcium 9.5 8.4 - 10.5 mg/dL   GFR 77.37 >60.00 mL/min  TSH  Result Value Ref Range   TSH 0.24 (L) 0.35 - 4.50 uIU/mL  VITAMIN D 25 Hydroxy (Vit-D Deficiency, Fractures)  Result Value Ref Range   VITD 28.65 (L) 30.00 - 100.00 ng/mL      Assessment & Plan:   Problem List Items Addressed This Visit    Advanced care planning/counseling discussion    Advanced directive discussion - does not have set up. HCPOA would be 2 children. Packet provided today.       BREAST CANCER, HX OF    Appreciate onc care. Seen yearly.       Hypothyroidism - Primary    Chronic, stable. Requests we take over thyroid replacement prescribing. Denies hyper or hypothyroid symptoms. Given low TSH, suggested decrease  levothyroxine to 55mcg daily, with one day a week taking 1/2 tab. Recheck levels 3 months.       Relevant Medications   levothyroxine (SYNTHROID, LEVOTHROID) 75 MCG tablet   Osteopenia    Continue calcium/vit D 600/400mg   rec start vit D 1000 IU daily.       Wrist swelling, right    R dorsal bony wrist swelling present. ?ganglion cyst vs other. Not bothersome to patient - will continue to monitor.           Follow up plan: Return in about 1 year (around 04/01/2018) for medicare wellness visit, annual exam, prior fasting for blood work.  Ria Bush, MD

## 2017-04-01 NOTE — Assessment & Plan Note (Signed)
Advanced directive discussion - does not have set up. HCPOA would be 2 children. Packet provided today.

## 2017-04-01 NOTE — Assessment & Plan Note (Signed)
Chronic, stable. Requests we take over thyroid replacement prescribing. Denies hyper or hypothyroid symptoms. Given low TSH, suggested decrease levothyroxine to 30mcg daily, with one day a week taking 1/2 tab. Recheck levels 3 months.

## 2017-04-01 NOTE — Assessment & Plan Note (Signed)
Appreciate onc care. Seen yearly.

## 2017-05-10 DIAGNOSIS — H401132 Primary open-angle glaucoma, bilateral, moderate stage: Secondary | ICD-10-CM | POA: Diagnosis not present

## 2017-05-28 ENCOUNTER — Encounter: Payer: Self-pay | Admitting: Family Medicine

## 2017-05-28 ENCOUNTER — Ambulatory Visit (INDEPENDENT_AMBULATORY_CARE_PROVIDER_SITE_OTHER): Payer: Medicare Other | Admitting: Family Medicine

## 2017-05-28 DIAGNOSIS — J020 Streptococcal pharyngitis: Secondary | ICD-10-CM

## 2017-05-28 MED ORDER — AMOXICILLIN 500 MG PO CAPS: 1000.0000 mg | ORAL_CAPSULE | Freq: Two times a day (BID) | ORAL | 0 refills | Status: DC

## 2017-05-28 NOTE — Patient Instructions (Addendum)
Complete 10 days of amoxicillin.  Use ibuprofen  800 mg every 8 hours for pain and fever.  Push fluids.   Strep Throat Strep throat is a bacterial infection of the throat. Your health care provider may call the infection tonsillitis or pharyngitis, depending on whether there is swelling in the tonsils or at the back of the throat. Strep throat is most common during the cold months of the year in children who are 68-77 years of age, but it can happen during any season in people of any age. This infection is spread from person to person (contagious) through coughing, sneezing, or close contact. What are the causes? Strep throat is caused by the bacteria called Streptococcus pyogenes. What increases the risk? This condition is more likely to develop in:  People who spend time in crowded places where the infection can spread easily.  People who have close contact with someone who has strep throat.  What are the signs or symptoms? Symptoms of this condition include:  Fever or chills.  Redness, swelling, or pain in the tonsils or throat.  Pain or difficulty when swallowing.  White or yellow spots on the tonsils or throat.  Swollen, tender glands in the neck or under the jaw.  Red rash all over the body (rare).  How is this diagnosed? This condition is diagnosed by performing a rapid strep test or by taking a swab of your throat (throat culture test). Results from a rapid strep test are usually ready in a few minutes, but throat culture test results are available after one or two days. How is this treated? This condition is treated with antibiotic medicine. Follow these instructions at home: Medicines  Take over-the-counter and prescription medicines only as told by your health care provider.  Take your antibiotic as told by your health care provider. Do not stop taking the antibiotic even if you start to feel better.  Have family members who also have a sore throat or fever tested  for strep throat. They may need antibiotics if they have the strep infection. Eating and drinking  Do not share food, drinking cups, or personal items that could cause the infection to spread to other people.  If swallowing is difficult, try eating soft foods until your sore throat feels better.  Drink enough fluid to keep your urine clear or pale yellow. General instructions  Gargle with a salt-water mixture 3-4 times per day or as needed. To make a salt-water mixture, completely dissolve -1 tsp of salt in 1 cup of warm water.  Make sure that all household members wash their hands well.  Get plenty of rest.  Stay home from school or work until you have been taking antibiotics for 24 hours.  Keep all follow-up visits as told by your health care provider. This is important. Contact a health care provider if:  The glands in your neck continue to get bigger.  You develop a rash, cough, or earache.  You cough up a thick liquid that is green, yellow-brown, or bloody.  You have pain or discomfort that does not get better with medicine.  Your problems seem to be getting worse rather than better.  You have a fever. Get help right away if:  You have new symptoms, such as vomiting, severe headache, stiff or painful neck, chest pain, or shortness of breath.  You have severe throat pain, drooling, or changes in your voice.  You have swelling of the neck, or the skin on the neck becomes  red and tender.  You have signs of dehydration, such as fatigue, dry mouth, and decreased urination.  You become increasingly sleepy, or you cannot wake up completely.  Your joints become red or painful. This information is not intended to replace advice given to you by your health care provider. Make sure you discuss any questions you have with your health care provider. Document Released: 11/13/2000 Document Revised: 07/15/2016 Document Reviewed: 03/11/2015 Elsevier Interactive Patient Education   2017 [employer].

## 2017-05-28 NOTE — Progress Notes (Signed)
   Subjective:    Patient ID: Joanna White, female    DOB: 09/04/1941, 76 y.o.   MRN: [id number]  Sore Throat   This is a new problem. The current episode started yesterday. The problem has been gradually worsening. The pain is worse on the left side. The maximum temperature recorded prior to her arrival was 100.4 - 100.9 F. The pain is at a severity of 9/10. The pain is severe. Associated symptoms include trouble swallowing. Pertinent negatives include no congestion, coughing, diarrhea, ear discharge, ear pain, headaches, shortness of breath, swollen glands or vomiting. She has had exposure to strep. Exposure to: 1 week ago. She has tried NSAIDs for the symptoms. The treatment provided mild (took old tamiflu) relief.  Last too ibuprofen 8 hours prior to coming in.  She is trying to drink a lot of fluids.  Blood pressure 126/64, pulse 85, temperature 99.5 F (37.5 C), temperature source Oral, height 5' (1.524 m), weight 144 lb (65.3 kg), SpO2 94 %.   Review of Systems  HENT: Positive for trouble swallowing. Negative for congestion, ear discharge and ear pain.   Respiratory: Negative for cough and shortness of breath.   Gastrointestinal: Negative for diarrhea and vomiting.  Neurological: Negative for headaches.       Objective:   Physical Exam  Constitutional: Vital signs are normal. She appears well-developed and well-nourished. She is cooperative.  Non-toxic appearance. She does not appear ill. No distress.  HENT:  Head: Normocephalic.  Right Ear: Hearing, tympanic membrane, external ear and ear canal normal. Tympanic membrane is not erythematous, not retracted and not bulging.  Left Ear: Hearing, tympanic membrane, external ear and ear canal normal. Tympanic membrane is not erythematous, not retracted and not bulging.  Nose: Mucosal edema and rhinorrhea present. Right sinus exhibits no maxillary sinus tenderness and no frontal sinus tenderness. Left sinus exhibits no maxillary  sinus tenderness and no frontal sinus tenderness.  Mouth/Throat: Uvula is midline and mucous membranes are normal. No uvula swelling. Posterior oropharyngeal edema and posterior oropharyngeal erythema present. No tonsillar abscesses.  Eyes: Conjunctivae, EOM and lids are normal. Pupils are equal, round, and reactive to light. Lids are everted and swept, no foreign bodies found.  Neck: Trachea normal and normal range of motion. Neck supple. Carotid bruit is not present. No thyroid mass and no thyromegaly present.  Cardiovascular: Normal rate, regular rhythm, S1 normal, S2 normal, normal heart sounds, intact distal pulses and normal pulses.  Exam reveals no gallop and no friction rub.   No murmur heard. Pulmonary/Chest: Effort normal and breath sounds normal. No tachypnea. No respiratory distress. She has no decreased breath sounds. She has no wheezes. She has no rhonchi. She has no rales.  Neurological: She is alert.  Skin: Skin is warm, dry and intact. No rash noted.  Psychiatric: Her speech is normal and behavior is normal. Judgment normal. Her mood appears not anxious. Cognition and memory are normal. She does not exhibit a depressed mood.          Assessment & Plan:

## 2017-05-28 NOTE — Assessment & Plan Note (Signed)
High pretest probability.. Treat without testing.  Complete 10 days of amox. Rest and fluids.

## 2017-06-23 ENCOUNTER — Other Ambulatory Visit: Payer: Self-pay | Admitting: *Deleted

## 2017-06-23 DIAGNOSIS — E039 Hypothyroidism, unspecified: Secondary | ICD-10-CM

## 2017-07-02 ENCOUNTER — Other Ambulatory Visit (INDEPENDENT_AMBULATORY_CARE_PROVIDER_SITE_OTHER): Payer: Medicare Other

## 2017-07-02 DIAGNOSIS — E039 Hypothyroidism, unspecified: Secondary | ICD-10-CM

## 2017-07-02 LAB — TSH: TSH: 0.22 u[IU]/mL — AB (ref 0.35–4.50)

## 2017-07-03 ENCOUNTER — Other Ambulatory Visit: Payer: Self-pay | Admitting: Family Medicine

## 2017-07-03 DIAGNOSIS — E039 Hypothyroidism, unspecified: Secondary | ICD-10-CM

## 2017-07-03 MED ORDER — LEVOTHYROXINE SODIUM 50 MCG PO TABS: 50.0000 ug | ORAL_TABLET | Freq: Every day | ORAL | 1 refills | Status: DC

## 2017-07-07 ENCOUNTER — Telehealth: Payer: Self-pay

## 2017-07-07 NOTE — Telephone Encounter (Signed)
Pt called to ck on what pharmacy levothyroxine was sent to; advised pt [place]; pt wants med to [facility] [address]. Pt will call and have med transferred.

## 2017-08-19 DIAGNOSIS — H401132 Primary open-angle glaucoma, bilateral, moderate stage: Secondary | ICD-10-CM | POA: Diagnosis not present

## 2017-09-08 ENCOUNTER — Ambulatory Visit (HOSPITAL_BASED_OUTPATIENT_CLINIC_OR_DEPARTMENT_OTHER): Payer: Medicare Other | Admitting: Hematology & Oncology

## 2017-09-08 ENCOUNTER — Other Ambulatory Visit (HOSPITAL_BASED_OUTPATIENT_CLINIC_OR_DEPARTMENT_OTHER): Payer: Medicare Other

## 2017-09-08 VITALS — BP 161/68 | HR 85 | Temp 98.2°F | Resp 18

## 2017-09-08 DIAGNOSIS — Z853 Personal history of malignant neoplasm of breast: Secondary | ICD-10-CM | POA: Diagnosis not present

## 2017-09-08 DIAGNOSIS — Z23 Encounter for immunization: Secondary | ICD-10-CM

## 2017-09-08 DIAGNOSIS — E559 Vitamin D deficiency, unspecified: Secondary | ICD-10-CM

## 2017-09-08 DIAGNOSIS — M8588 Other specified disorders of bone density and structure, other site: Secondary | ICD-10-CM

## 2017-09-08 DIAGNOSIS — N6320 Unspecified lump in the left breast, unspecified quadrant: Secondary | ICD-10-CM

## 2017-09-08 LAB — COMPREHENSIVE METABOLIC PANEL
ALT: 15 U/L (ref 0–55)
AST: 21 U/L (ref 5–34)
Albumin: 4.1 g/dL (ref 3.5–5.0)
Alkaline Phosphatase: 57 U/L (ref 40–150)
Anion Gap: 10 mEq/L (ref 3–11)
BUN: 18.9 mg/dL (ref 7.0–26.0)
CALCIUM: 9.8 mg/dL (ref 8.4–10.4)
CHLORIDE: 106 meq/L (ref 98–109)
CO2: 26 meq/L (ref 22–29)
Creatinine: 1.2 mg/dL — ABNORMAL HIGH (ref 0.6–1.1)
EGFR: 46 mL/min/{1.73_m2} — ABNORMAL LOW (ref >60)
GLUCOSE: 99 mg/dL (ref 70–140)
Potassium: 4.5 mEq/L (ref 3.5–5.1)
SODIUM: 142 meq/L (ref 136–145)
Total Bilirubin: 0.57 mg/dL (ref 0.20–1.20)
Total Protein: 8 g/dL (ref 6.4–8.3)

## 2017-09-08 LAB — CBC WITH DIFFERENTIAL (CANCER CENTER ONLY)
BASO#: 0 10*3/uL (ref 0.0–0.2)
BASO%: 0.5 % (ref 0.0–2.0)
EOS%: 5.9 % (ref 0.0–7.0)
Eosinophils Absolute: 0.4 10*3/uL (ref 0.0–0.5)
HEMATOCRIT: 47.6 % — AB (ref 34.8–46.6)
HGB: 16.1 g/dL — ABNORMAL HIGH (ref 11.6–15.9)
LYMPH#: 2 10*3/uL (ref 0.9–3.3)
LYMPH%: 30.4 % (ref 14.0–48.0)
MCH: 30.6 pg (ref 26.0–34.0)
MCHC: 33.8 g/dL (ref 32.0–36.0)
MCV: 90 fL (ref 81–101)
MONO#: 0.8 10*3/uL (ref 0.1–0.9)
MONO%: 12.6 % (ref 0.0–13.0)
NEUT%: 50.6 % (ref 39.6–80.0)
NEUTROS ABS: 3.3 10*3/uL (ref 1.5–6.5)
Platelets: 218 10*3/uL (ref 145–400)
RBC: 5.27 10*6/uL (ref 3.70–5.32)
RDW: 14 % (ref 11.1–15.7)
WBC: 6.4 10*3/uL (ref 3.9–10.0)

## 2017-09-08 MED ORDER — INFLUENZA VAC SPLIT QUAD 0.5 ML IM SUSY
PREFILLED_SYRINGE | INTRAMUSCULAR | Status: AC
Start: 2017-09-08 — End: 2017-09-08
  Filled 2017-09-08: qty 0.5

## 2017-09-08 MED ORDER — INFLUENZA VAC SPLIT QUAD 0.5 ML IM SUSY
0.5000 mL | PREFILLED_SYRINGE | Freq: Once | INTRAMUSCULAR | Status: AC
  Administered 2017-09-08: 0.5 mL via INTRAMUSCULAR

## 2017-09-08 NOTE — Progress Notes (Signed)
Hematology and Oncology Follow Up Visit  Joanna White [id number] 11-04-1941 76 y.o. 09/08/2017   Principle Diagnosis:  1. 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   Current Therapy:   Observation    Interim History: Joanna White is here today for her annual visit. As always, she is doing well. She has had no problems since we last saw her.  She apparently had a "lump" in the left breast. This was biopsied. It was a calcified cyst.  She had her last mammogram a year ago. She is due for one next month .  She's had no problems with fatigue or weakness. She's had no cough. His been no change in bowel or bladder habits. She's had no nausea or vomiting. She has had some thyroid issues. She had her Synthroid dose adjusted by her family doctor. I think he will see her next week.  She's had no fever.  She will get her flu shot today.  She has a performance status of ECOG 0.   Medications:  Allergies as of 09/08/2017      Reactions   Fluticasone Propionate    REACTION: throat tight      Medication List       Accurate as of 09/08/17 12:12 PM. Always use your most recent med list.          amoxicillin 500 MG capsule Commonly known as:  AMOXIL Take 2 capsules (1,000 mg total) by mouth 2 (two) times daily.   aspirin 81 MG tablet Take 81 mg by mouth daily.   beta carotene w/minerals tablet Take 1 tablet by mouth daily.   Calcium-Vitamin D 600-400 MG-UNIT Tabs Take 2 tablets by mouth daily.   latanoprost 0.005 % ophthalmic solution Commonly known as:  XALATAN Place into both eyes at bedtime.   levothyroxine 50 MCG tablet Commonly known as:  SYNTHROID, LEVOTHROID Take 1 tablet (50 mcg total) by mouth daily. One day a week take 1/2 tablet only   multivitamin tablet Take 1 tablet by mouth daily.   Vitamin D3 1000 units Caps Take 1 capsule (1,000 Units total) by mouth daily.       Allergies:  Allergies  Allergen Reactions  .  Fluticasone Propionate     REACTION: throat tight    Past Medical History, Surgical history, Social history, and Family History were reviewed and updated.  Review of Systems: As stated in the interim history.   Physical Exam:  oral temperature is 98.2 F (36.8 C). Her blood pressure is 161/68 (abnormal) and her pulse is 85. Her respiration is 18 and oxygen saturation is 98%.   Wt Readings from Last 3 Encounters:  05/28/17 144 lb (65.3 kg)  04/01/17 142 lb 12.8 oz (64.8 kg)  03/01/17 141 lb 4 oz (64.1 kg)    Well-developed and well-nourished white female in no obvious distress. Head and neck exam shows no ocular or oral lesions. There are no palpable cervical or supraclavicular lymph nodes. Lungs are clear bilaterally. Cardiac exam regular rate and rhythm with no murmurs, rubs or bruits. Breast exam shows right breast with no masses, edema or erythema. There is no right axillary adenopathy. Left breast shows well-healed lumpectomy at the 11:00 position. She has some slight firmness of the lumpectomy site. There is no distinct mass in the left breast. There is no left axillary adenopathy. Abdomen is soft. She has good bowel sounds. There is no fluid wave. There is no palpable liver or spleen tip. Extremities shows  no clubbing, cyanosis or edema. She has good range of motion of her joints. There is no lymphedema in her left arm. Skin exam shows no rashes, ecchymoses or petechia. Neurological exam shows no focal neurological deficits.    Lab Results  Component Value Date   WBC 6.4 09/08/2017   HGB 16.1 (H) 09/08/2017   HCT 47.6 (H) 09/08/2017   MCV 90 09/08/2017   PLT 218 09/08/2017   No results found for: FERRITIN, IRON, TIBC, UIBC, IRONPCTSAT Lab Results  Component Value Date   RBC 5.27 09/08/2017   No results found for: KPAFRELGTCHN, LAMBDASER, KAPLAMBRATIO No results found for: IGGSERUM, IGA, IGMSERUM No results found for: Odetta Pink, SPEI   Chemistry      Component Value Date/Time   NA 142 09/08/2017 1028   K 4.5 09/08/2017 1028   CL 106 03/01/2017 0942   CL 103 09/14/2014 1111   CO2 26 09/08/2017 1028   BUN 18.9 09/08/2017 1028   CREATININE 1.2 (H) 09/08/2017 1028      Component Value Date/Time   CALCIUM 9.8 09/08/2017 1028   ALKPHOS 57 09/08/2017 1028   AST 21 09/08/2017 1028   ALT 15 09/08/2017 1028   BILITOT 0.57 09/08/2017 1028     Impression and Plan: Joanna White is a very pleasant 76 yo white female with history of stage IIA ductal carcinoma of the left breast treated with adjuvant chemotherapy with [facility]. Her tumor was ER negative. She is now going on 20 years out and has done quite well.   From my point of view, he everything looks fantastic. I do not see any obvious recurrence. However, she is at risk for recurrence although I think the risk is probably less than 5%.  She does like to see [place]. She does not mind seeing [place] yearly. She feels that we give her peace of mind as we give her a thorough examinations.  Her blood pressure is on the higher side. Again, she sees her family doctor next week and I'm sure that he will address this.   Volanda Napoleon, MD 10/10/201812:12 PM

## 2017-09-08 NOTE — Patient Instructions (Signed)

## 2017-09-09 LAB — VITAMIN D 25 HYDROXY (VIT D DEFICIENCY, FRACTURES): VIT D 25 HYDROXY: 35 ng/mL (ref 30.0–100.0)

## 2017-09-13 ENCOUNTER — Other Ambulatory Visit: Payer: Self-pay | Admitting: Family Medicine

## 2017-09-13 ENCOUNTER — Other Ambulatory Visit (INDEPENDENT_AMBULATORY_CARE_PROVIDER_SITE_OTHER): Payer: Medicare Other

## 2017-09-13 DIAGNOSIS — E039 Hypothyroidism, unspecified: Secondary | ICD-10-CM

## 2017-09-13 LAB — T4, FREE: Free T4: 0.77 ng/dL (ref 0.60–1.60)

## 2017-09-13 LAB — TSH: TSH: 28.13 u[IU]/mL — AB (ref 0.35–4.50)

## 2017-09-13 MED ORDER — LEVOTHYROXINE SODIUM 50 MCG PO TABS: 50.0000 ug | ORAL_TABLET | Freq: Every day | ORAL | 1 refills | Status: DC

## 2017-09-17 ENCOUNTER — Telehealth: Payer: Self-pay | Admitting: Medical Oncology

## 2017-09-17 NOTE — Telephone Encounter (Signed)
Requests lab results. I gave her main number to cancer center at [facility] and told her to call there for results.

## 2017-09-24 DIAGNOSIS — Z1231 Encounter for screening mammogram for malignant neoplasm of breast: Secondary | ICD-10-CM | POA: Diagnosis not present

## 2017-09-24 DIAGNOSIS — Z853 Personal history of malignant neoplasm of breast: Secondary | ICD-10-CM | POA: Diagnosis not present

## 2017-09-29 DIAGNOSIS — R922 Inconclusive mammogram: Secondary | ICD-10-CM | POA: Diagnosis not present

## 2017-09-29 LAB — HM MAMMOGRAPHY

## 2017-10-04 ENCOUNTER — Telehealth: Payer: Self-pay

## 2017-10-04 NOTE — Telephone Encounter (Signed)
Left message on vm per dpr relaying message and instructions per Dr. Darnell Level (see labs, 09/13/17).

## 2017-10-04 NOTE — Telephone Encounter (Signed)
Copied from [place] [id number]. Topic: Appointment Scheduling - Scheduling Inquiry for Clinic >> Sep 30, 2017 12:47 PM Aurelio Brash B wrote: Reason for CRM: PT called to ask if she needs more labs to check her thyroid since her meds has changed,  I do not see labs ordered, no apt has been scheduled.  Pt says its ok to leave message on answering machine.  Pt also asking about dosage for the thyroid med once she gets new lab results.

## 2017-10-05 ENCOUNTER — Encounter: Payer: Self-pay | Admitting: Hematology & Oncology

## 2017-10-12 ENCOUNTER — Encounter: Payer: Self-pay | Admitting: Family Medicine

## 2017-11-16 ENCOUNTER — Ambulatory Visit: Payer: Medicare Other

## 2017-11-16 ENCOUNTER — Other Ambulatory Visit (INDEPENDENT_AMBULATORY_CARE_PROVIDER_SITE_OTHER): Payer: Medicare Other

## 2017-11-16 DIAGNOSIS — E039 Hypothyroidism, unspecified: Secondary | ICD-10-CM

## 2017-11-16 LAB — TSH: TSH: 7.44 u[IU]/mL — AB (ref 0.35–4.50)

## 2017-11-16 LAB — T4, FREE: FREE T4: 0.82 ng/dL (ref 0.60–1.60)

## 2017-11-18 DIAGNOSIS — H401132 Primary open-angle glaucoma, bilateral, moderate stage: Secondary | ICD-10-CM | POA: Diagnosis not present

## 2017-11-20 ENCOUNTER — Other Ambulatory Visit: Payer: Self-pay | Admitting: Family Medicine

## 2017-11-20 DIAGNOSIS — E039 Hypothyroidism, unspecified: Secondary | ICD-10-CM

## 2017-11-20 MED ORDER — LEVOTHYROXINE SODIUM 75 MCG PO TABS: 75.0000 ug | ORAL_TABLET | Freq: Every day | ORAL | 3 refills | Status: DC

## 2017-11-24 ENCOUNTER — Telehealth: Payer: Self-pay | Admitting: Family Medicine

## 2017-11-24 NOTE — Telephone Encounter (Signed)
Patient called in with medication instruction clarification on the levothyroxine ordered. She stated "the message left on my answering machine says to take my synthroid 75 mcg 1 tablet daily and one day during the week take 1/2 tablet. On the synthroid medicine bottle I just picked up says take 1/2 tab daily x 1 week, then 1 tab daily thereafter. I'm confused as to which one do I follow?" Per orders in the chart, Dr. Danise Mina  levothyroxine (SYNTHROID, LEVOTHROID) 75 MCG tablet On chart   Dose: 75 mcg Take 1 tablet (75 mcg total) by mouth daily. One day a week take 1/2 tablet (37.5mg ) 11/20/2017  Patient read the orders per Dr. Danise Mina, verbalized understanding of the orders and repeated the orders back for clarification of understanding. I advised the patient she could call the pharmacy to let them know the bottle was mislabeled, but she said she will just "follow what the provider says and not worry about what is on the bottle."

## 2017-12-14 ENCOUNTER — Ambulatory Visit: Payer: Self-pay | Admitting: *Deleted

## 2017-12-14 ENCOUNTER — Telehealth: Payer: Self-pay | Admitting: Family Medicine

## 2017-12-14 NOTE — Telephone Encounter (Signed)
Copied from [place] [id number]. Topic: Inquiry >> Dec 14, 2017 12:13 PM Corie Chiquito, [place] wrote: Reason for CRM: Patient called because she would like to know if Dr.G  could call her in something for a sinus infection. She would like the medication to be sent to the [facility] on [address] Pennie Banter [place] [phone]

## 2017-12-14 NOTE — Telephone Encounter (Signed)
Called in c/o her nose stopping up at night on the side she is laying on.   Has clear secretions when she blows her nose.   During the day she states she is not stopped up.   The night congestion started last night. I went over home care advice with her to try since the secretions are clear and she is afebrile. She is going to use her [employer] and try some Afrin nasal spray.   I advised her not to use the Afrin for more than 3 days due to rebound effect.    Reason for Disposition . [1] Sinus congestion as part of a cold AND [2] present < 10 days  Answer Assessment - Initial Assessment Questions 1. LOCATION: "Where does it hurt?"      It hurts on the right side of my head and around my right eye. 2. ONSET: "When did the sinus pain start?"  (e.g., hours, days)      Yesterday afternoon. 3. SEVERITY: "How bad is the pain?"   (Scale 1-10; mild, moderate or severe)   - MILD (1-3): doesn't interfere with normal activities    - MODERATE (4-7): interferes with normal activities (e.g., work or school) or awakens from sleep   - SEVERE (8-10): excruciating pain and patient unable to do any normal activities        4-5 on a pain scale 4. RECURRENT SYMPTOM: "Have you ever had sinus problems before?" If so, ask: "When was the last time?" and "What happened that time?"      Yes    Many times 5. NASAL CONGESTION: "Is the nose blocked?" If so, ask, "Can you open it or must you breathe through the mouth?"     Nose is fine now.   It stops up during the night from one side to the other. 6. NASAL DISCHARGE: "Do you have discharge from your nose?" If so ask, "What color?"     When I blow it it's clear maybe some yellow. 7. FEVER: "Do you have a fever?" If so, ask: "What is it, how was it measured, and when did it start?"      No fever 8. OTHER SYMPTOMS: "Do you have any other symptoms?" (e.g., sore throat, cough, earache, difficulty breathing)     No other symptoms. 9. PREGNANCY: "Is there any chance you are  pregnant?" "When was your last menstrual period?"     Not asked due to age  Protocols used: [place]

## 2017-12-15 NOTE — Telephone Encounter (Signed)
Pt called back in, pt says that she is not feeling any better after doing what provider suggested. Pt would like to know if provider could send something else in to the pharmacy for her?    Please advise.

## 2017-12-15 NOTE — Telephone Encounter (Signed)
called yesterday, received care advice through [facility]. Calling in today no better. We have not evaluated her for this. rec office visit, as is our policy.

## 2017-12-15 NOTE — Telephone Encounter (Signed)
Spoke with pt relaying message per Dr. Darnell Level.  She expressed her thanks then disconnected the call.

## 2017-12-16 NOTE — Telephone Encounter (Signed)
I sent this example to [facility] leadership again for education to agents.

## 2018-02-24 DIAGNOSIS — H401132 Primary open-angle glaucoma, bilateral, moderate stage: Secondary | ICD-10-CM | POA: Diagnosis not present

## 2018-02-24 DIAGNOSIS — Z961 Presence of intraocular lens: Secondary | ICD-10-CM | POA: Diagnosis not present

## 2018-03-01 ENCOUNTER — Other Ambulatory Visit: Payer: Self-pay | Admitting: Family Medicine

## 2018-03-01 DIAGNOSIS — E785 Hyperlipidemia, unspecified: Secondary | ICD-10-CM | POA: Insufficient documentation

## 2018-03-01 DIAGNOSIS — E039 Hypothyroidism, unspecified: Secondary | ICD-10-CM

## 2018-03-02 ENCOUNTER — Ambulatory Visit: Payer: Self-pay

## 2018-03-02 ENCOUNTER — Ambulatory Visit (INDEPENDENT_AMBULATORY_CARE_PROVIDER_SITE_OTHER): Payer: Medicare Other

## 2018-03-02 VITALS — BP 130/68 | HR 82 | Temp 98.5°F | Ht 60.0 in | Wt 144.0 lb

## 2018-03-02 DIAGNOSIS — Z0000 Encounter for general adult medical examination without abnormal findings: Secondary | ICD-10-CM

## 2018-03-02 DIAGNOSIS — E785 Hyperlipidemia, unspecified: Secondary | ICD-10-CM

## 2018-03-02 DIAGNOSIS — E039 Hypothyroidism, unspecified: Secondary | ICD-10-CM | POA: Diagnosis not present

## 2018-03-02 LAB — COMPREHENSIVE METABOLIC PANEL
ALBUMIN: 4.1 g/dL (ref 3.5–5.2)
ALT: 15 U/L (ref 0–35)
AST: 20 U/L (ref 0–37)
Alkaline Phosphatase: 45 U/L (ref 39–117)
BUN: 14 mg/dL (ref 6–23)
CO2: 30 meq/L (ref 19–32)
CREATININE: 0.76 mg/dL (ref 0.40–1.20)
Calcium: 9.8 mg/dL (ref 8.4–10.5)
Chloride: 104 mEq/L (ref 96–112)
GFR: 78.34 mL/min (ref >60.00)
GLUCOSE: 97 mg/dL (ref 70–99)
Potassium: 4.8 mEq/L (ref 3.5–5.1)
Sodium: 140 mEq/L (ref 135–145)
Total Bilirubin: 0.5 mg/dL (ref 0.2–1.2)
Total Protein: 7.6 g/dL (ref 6.0–8.3)

## 2018-03-02 LAB — T4, FREE: Free T4: 0.83 ng/dL (ref 0.60–1.60)

## 2018-03-02 LAB — LIPID PANEL
CHOL/HDL RATIO: 4
Cholesterol: 186 mg/dL (ref 0–200)
HDL: 46 mg/dL (ref >39.00)
LDL Cholesterol: 110 mg/dL — ABNORMAL HIGH (ref 0–99)
NonHDL: 140.24
Triglycerides: 153 mg/dL — ABNORMAL HIGH (ref 0.0–149.0)
VLDL: 30.6 mg/dL (ref 0.0–40.0)

## 2018-03-02 LAB — TSH: TSH: 1.65 u[IU]/mL (ref 0.35–4.50)

## 2018-03-02 NOTE — Progress Notes (Signed)
PCP notes:   Health maintenance:  No gaps identified.  Abnormal screenings:   None  Patient concerns:   None  Nurse concerns:  None  Next PCP appt:   03/07/18 @ 1115

## 2018-03-02 NOTE — Patient Instructions (Addendum)
Ms. Kapusta , Thank you for taking time to come for your Medicare Wellness Visit. I appreciate your ongoing commitment to your health goals. Please review the following plan we discussed and let me know if I can assist you in the future.   These are the goals we discussed: Goals    . DIET - INCREASE WATER INTAKE     Starting 03/02/2018, I will continue to drink at least 6-8 glasses of water daily.        This is a list of the screening recommended for you and due dates:  Health Maintenance  Topic Date Due  . DTaP/Tdap/Td vaccine (1 - Tdap) 03/03/2019*  . Tetanus Vaccine  03/03/2019*  . Flu Shot  06/30/2018  . DEXA scan (bone density measurement)  Completed  . Pneumonia vaccines  Completed  *Topic was postponed. The date shown is not the original due date.   Preventive Care for Adults  A healthy lifestyle and preventive care can promote health and wellness. Preventive health guidelines for adults include the following key practices.  . A routine yearly physical is a good way to check with your health care provider about your health and preventive screening. It is a chance to share any concerns and updates on your health and to receive a thorough exam.  . Visit your dentist for a routine exam and preventive care every 6 months. Brush your teeth twice a day and floss once a day. Good oral hygiene prevents tooth decay and gum disease.  . The frequency of eye exams is based on your age, health, family medical history, use  of contact lenses, and other factors. Follow your health care provider's recommendations for frequency of eye exams.  . Eat a healthy diet. Foods like vegetables, fruits, whole grains, low-fat dairy products, and lean protein foods contain the nutrients you need without too many calories. Decrease your intake of foods high in solid fats, added sugars, and salt. Eat the right amount of calories for you. Get information about a proper diet from your health care provider,  if necessary.  . Regular physical exercise is one of the most important things you can do for your health. Most adults should get at least 150 minutes of moderate-intensity exercise (any activity that increases your heart rate and causes you to sweat) each week. In addition, most adults need muscle-strengthening exercises on 2 or more days a week.  Silver Sneakers may be a benefit available to you. To determine eligibility, you may visit the website: www.silversneakers.com or contact program at 346-838-8889 Mon-Fri between 8AM-8PM.   . Maintain a healthy weight. The body mass index (BMI) is a screening tool to identify possible weight problems. It provides an estimate of body fat based on height and weight. Your health care provider can find your BMI and can help you achieve or maintain a healthy weight.   For adults 20 years and older: ? A BMI below 18.5 is considered underweight. ? A BMI of 18.5 to 24.9 is normal. ? A BMI of 25 to 29.9 is considered overweight. ? A BMI of 30 and above is considered obese.   . Maintain normal blood lipids and cholesterol levels by exercising and minimizing your intake of saturated fat. Eat a balanced diet with plenty of fruit and vegetables. Blood tests for lipids and cholesterol should begin at age 77 and be repeated every 5 years. If your lipid or cholesterol levels are high, you are over 50, or you are at high  risk for heart disease, you may need your cholesterol levels checked more frequently. Ongoing high lipid and cholesterol levels should be treated with medicines if diet and exercise are not working.  . If you smoke, find out from your health care provider how to quit. If you do not use tobacco, please do not start.  . If you choose to drink alcohol, please do not consume more than 2 drinks per day. One drink is considered to be 12 ounces (355 mL) of beer, 5 ounces (148 mL) of wine, or 1.5 ounces (44 mL) of liquor.  . If you are 27-48 years old, ask  your health care provider if you should take aspirin to prevent strokes.  . Use sunscreen. Apply sunscreen liberally and repeatedly throughout the day. You should seek shade when your shadow is shorter than you. Protect yourself by wearing long sleeves, pants, a wide-brimmed hat, and sunglasses year round, whenever you are outdoors.  . Once a month, do a whole body skin exam, using a mirror to look at the skin on your back. Tell your health care provider of new moles, moles that have irregular borders, moles that are larger than a pencil eraser, or moles that have changed in shape or color.

## 2018-03-02 NOTE — Progress Notes (Signed)
Subjective:   Joanna White is a 77 y.o. female who presents for Medicare Annual (Subsequent) preventive examination.  Review of Systems:  N/A Cardiac Risk Factors include: advanced age (>67men, >75 women)     Objective:     Vitals: BP 130/68 (BP Location: Left Arm, Patient Position: Sitting, Cuff Size: Normal)   Pulse 82   Temp 98.5 F (36.9 C) (Oral)   Ht 5' (1.524 m) Comment: no shoes  Wt 144 lb (65.3 kg)   SpO2 98%   BMI 28.12 kg/m   Body mass index is 28.12 kg/m.  Advanced Directives 03/02/2018 09/08/2017 03/01/2017 09/11/2016 02/28/2016 09/13/2015 09/14/2014  Does Patient Have a Medical Advance Directive? No Yes Yes No Yes No No  Type of Advance Directive - Living will [place];Living will - Living will;Healthcare Power of Attorney - -  Does patient want to make changes to medical advance directive? No - Patient declined - - - No - Patient declined - -  Copy of [place] in Chart? - - No - copy requested - No - copy requested - -  Would patient like information on creating a medical advance directive? No - Patient declined - - No - patient declined information - - -    Tobacco Social History   Tobacco Use  Smoking Status Never Smoker  Smokeless Tobacco Never Used     Counseling given: No   Clinical Intake:  Pre-visit preparation completed: Yes  Pain : No/denies pain Pain Score: 0-No pain     Nutritional Status: BMI 25 -29 Overweight Nutritional Risks: None Diabetes: No  How often do you need to have someone help you when you read instructions, pamphlets, or other written materials from your doctor or pharmacy?: 1 - Never What is the last grade level you completed in school?: 12th grade + college courses  Interpreter Needed?: No  Comments: pt is a widow and lives alone Information entered by :: LPinson, LPN  Past Medical History:  Diagnosis Date  . Glaucoma   . Hypothyroid   . Osteopenia 08/2011   h/o  osteoporosis, s/p boniva for 5 yrs  . Personal history of malignant neoplasm of breast 1998   Left   Past Surgical History:  Procedure Laterality Date  . BREAST LUMPECTOMY  1998   Left  . CESAREAN SECTION     x 2  . DEXA  10/2015   osteopenia, T -1.1 spine, hip -1.7   . GYN surgery     uterine lining removed for heavy bleeding and polyp  . Lymphnode removal  1998   left axilla  . Thumb surgery     bilateral  . TOTAL KNEE ARTHROPLASTY Left    Family History  Problem Relation Age of Onset  . Diabetes Brother        x2  . Cancer Father 63       lung  . Cancer Mother 39       MM  . Coronary artery disease Brother 66       CABG  . Stroke Neg Hx    Social History   Socioeconomic History  . Marital status: Widowed    Spouse name: Not on file  . Number of children: 2  . Years of education: Not on file  . Highest education level: Not on file  Occupational History  . Occupation: Retired  Scientific laboratory technician  . Financial resource strain: Not on file  . Food insecurity:    Worry:  Not on file    Inability: Not on file  . Transportation needs:    Medical: Not on file    Non-medical: Not on file  Tobacco Use  . Smoking status: Never Smoker  . Smokeless tobacco: Never Used  Substance and Sexual Activity  . Alcohol use: No    Alcohol/week: 0.0 oz  . Drug use: No  . Sexual activity: Never  Lifestyle  . Physical activity:    Days per week: Not on file    Minutes per session: Not on file  . Stress: Not on file  Relationships  . Social connections:    Talks on phone: Not on file    Gets together: Not on file    Attends religious service: Not on file    Active member of club or organization: Not on file    Attends meetings of clubs or organizations: Not on file    Relationship status: Not on file  Other Topics Concern  . 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 aspirin 81 MG tablet Take 81 mg by mouth daily.    . beta carotene w/minerals (OCUVITE) tablet Take 1 tablet by mouth daily.  . Calcium Carb-Cholecalciferol (CALCIUM-VITAMIN D) 600-400 MG-UNIT TABS Take 2 tablets by mouth daily.  . Cholecalciferol (VITAMIN D3) 1000 units CAPS Take 1 capsule (1,000 Units total) by mouth daily.  Marland Kitchen latanoprost (XALATAN) 0.005 % ophthalmic solution Place into both eyes at bedtime.   Marland Kitchen levothyroxine (SYNTHROID, LEVOTHROID) 75 MCG tablet Take 1 tablet (75 mcg total) by mouth daily. One day a week take 1/2 tablet (37.5mg )  . Multiple Vitamin (MULTIVITAMIN) tablet Take 1 tablet by mouth daily.    . [DISCONTINUED] amoxicillin (AMOXIL) 500 MG capsule Take 2 capsules (1,000 mg total) by mouth 2 (two) times daily. (Patient taking differently: Take 1,000 mg by mouth 2 (two) times daily. Before dental appts)   No facility-administered encounter medications on file as of 03/02/2018.     Activities of Daily Living In your present state of health, do you have any difficulty performing the following activities: 03/02/2018  Hearing? N  Vision? N  Difficulty concentrating or making decisions? N  Walking or climbing stairs? N  Dressing or bathing? N  Doing errands, shopping? N  Preparing Food and eating ? N  Using the Toilet? N  In the past six months, have you accidently leaked urine? N  Do you have problems with loss of bowel control? N  Managing your Medications? N  Managing your Finances? N  Housekeeping or managing your Housekeeping? N  Some recent data might be hidden    Patient Care Team: Ria Bush, MD as PCP - General Rutherford Guys, MD as Consulting Physician (Ophthalmology)    Assessment:   This is a routine wellness examination for [facility].   Hearing Screening   125Hz  250Hz  500Hz  1000Hz  2000Hz  3000Hz   4000Hz  6000Hz  8000Hz   Right ear:   40 40 40  40    Left ear:   40 40 40  40    Vision Screening Comments: Last vision exam in April 2019 with Dr. Gershon Crane    Exercise Activities and Dietary recommendations Current Exercise Habits: The patient does not participate in  regular exercise at present, Exercise limited by: None identified  Goals    . DIET - INCREASE WATER INTAKE     Starting 03/02/2018, I will continue to drink at least 6-8 glasses of water daily.        Fall Risk Fall Risk  03/02/2018 03/01/2017 09/11/2016 02/28/2016 09/13/2015  Falls in the past year? No No No No No   Depression Screen PHQ 2/9 Scores 03/02/2018 03/01/2017 02/28/2016  PHQ - 2 Score 0 0 0  PHQ- 9 Score 0 - -     Cognitive Function MMSE - Mini Mental State Exam 03/02/2018 03/01/2017 02/28/2016  Orientation to time 5 5 5   Orientation to Place 5 5 5   Registration 3 3 3   Attention/ Calculation 0 0 0  Recall 3 3 3   Language- name 2 objects 0 0 0  Language- repeat 1 1 1   Language- follow 3 step command 3 3 3   Language- read & follow direction 0 0 0  Write a sentence 0 0 0  Copy design 0 0 0  Total score 20 20 20      PLEASE NOTE: A Mini-Cog screen was completed. Maximum score is 20. A value of 0 denotes this part of Folstein MMSE was not completed or the patient failed this part of the Mini-Cog screening.   Mini-Cog Screening Orientation to Time - Max 5 pts Orientation to Place - Max 5 pts Registration - Max 3 pts Recall - Max 3 pts Language Repeat - Max 1 pts Language Follow 3 Step Command - Max 3 pts     Immunization History  Administered Date(s) Administered  . Influenza Whole 11/02/2007, 11/02/2012  . Influenza,inj,Quad PF,6+ Mos 09/14/2013, 09/14/2014, 09/13/2015, 09/11/2016, 09/08/2017  . Pneumococcal Conjugate-13 02/28/2016  . Pneumococcal Polysaccharide-23 09/14/2014  . Zoster 12/01/2015    Screening Tests Health Maintenance  Topic Date Due  . DTaP/Tdap/Td (1 - Tdap) 03/03/2019 (Originally  09/22/1960)  . TETANUS/TDAP  03/03/2019 (Originally 09/22/1960)  . INFLUENZA VACCINE  06/30/2018  . DEXA SCAN  Completed  . PNA vac Low Risk Adult  Completed       Plan:     I have personally reviewed, addressed, and noted the following in the patient's chart:  A. Medical and social history B. Use of alcohol, tobacco or illicit drugs  C. Current medications and supplements D. Functional ability and status E.  Nutritional status F.  Physical activity G. Advance directives H. List of other physicians I.  Hospitalizations, surgeries, and ER visits in previous 12 months J.  [place] to include hearing, vision, cognitive, depression L. Referrals and appointments - none  In addition, I have reviewed and discussed with patient certain preventive protocols, quality metrics, and best practice recommendations. A written personalized care plan for preventive services as well as general preventive health recommendations were provided to patient.  See attached scanned questionnaire for additional information.   Signed,   Lindell Noe, MHA, BS, LPN Health Coach

## 2018-03-06 NOTE — Progress Notes (Signed)
I reviewed health advisor's note, was available for consultation, and agree with documentation and plan.  

## 2018-03-07 ENCOUNTER — Ambulatory Visit (INDEPENDENT_AMBULATORY_CARE_PROVIDER_SITE_OTHER): Payer: Medicare Other | Admitting: Family Medicine

## 2018-03-07 ENCOUNTER — Encounter: Payer: Self-pay | Admitting: Family Medicine

## 2018-03-07 DIAGNOSIS — E785 Hyperlipidemia, unspecified: Secondary | ICD-10-CM

## 2018-03-07 DIAGNOSIS — E039 Hypothyroidism, unspecified: Secondary | ICD-10-CM

## 2018-03-07 DIAGNOSIS — M8588 Other specified disorders of bone density and structure, other site: Secondary | ICD-10-CM | POA: Diagnosis not present

## 2018-03-07 DIAGNOSIS — Z7189 Other specified counseling: Secondary | ICD-10-CM | POA: Diagnosis not present

## 2018-03-07 DIAGNOSIS — Z853 Personal history of malignant neoplasm of breast: Secondary | ICD-10-CM

## 2018-03-07 MED ORDER — ASPIRIN 81 MG PO TABS: 81.0000 mg | ORAL_TABLET | ORAL | Status: DC

## 2018-03-07 MED ORDER — LEVOTHYROXINE SODIUM 75 MCG PO TABS: 75.0000 ug | ORAL_TABLET | Freq: Every day | ORAL | 3 refills | Status: DC

## 2018-03-07 NOTE — Progress Notes (Signed)
BP 138/70 (BP Location: Left Arm, Patient Position: Sitting, Cuff Size: Normal)   Pulse 81   Temp 98.1 F (36.7 C) (Oral)   Ht 5' (1.524 m)   Wt 146 lb (66.2 kg)   SpO2 95%   BMI 28.51 kg/m    CC: AMW f/u visit Subjective:    Patient ID: Joanna White, female    DOB: 17-May-1941, 77 y.o.   MRN: [id number]  HPI: Joanna White is a 77 y.o. female presenting on 03/07/2018 for Annual Exam (Pt 2.)   Saw Katha Cabal last week for medicare wellness visit. Note reviewed.  She has not been walking regularly.   Preventative: Colon cancer screening - cologuard 02/2016 negative.  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 mammo Biards3 08/2017 rec rpt 6 months L side. Sees onc yearly.  Well woman exam - aged out.  DEXA 10/2015 osteopenia, T -1.1 spine, hip -1.7, upcoming repeat this year.  Lung cancer screening - never smoker  Flu shot yearly  Tetanus shot - unsure  Pneumovax 2015, prevnar 2017 zostavax - 2017. Shingrix - discussed.  Advanced directive discussion - does not have set up but has talked to children to let he wishes known. HCPOA would be 2 children.  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 Interim medical history since our last visit reviewed. Allergies and medications reviewed and updated. Outpatient Medications Prior to Visit  Medication Sig Dispense Refill  . Calcium Carb-Cholecalciferol (CALCIUM-VITAMIN D) 600-400 MG-UNIT TABS Take 2 tablets by mouth daily.    . Cholecalciferol (VITAMIN D3) 1000  units CAPS Take 1 capsule (1,000 Units total) by mouth daily. 30 capsule   . latanoprost (XALATAN) 0.005 % ophthalmic solution Place into both eyes at bedtime.     . Multiple Vitamin (MULTIVITAMIN) tablet Take 1 tablet by mouth daily.      Marland Kitchen aspirin 81 MG tablet Take 81 mg by mouth daily.      . beta carotene w/minerals (OCUVITE) tablet Take 1 tablet by mouth daily.    Marland Kitchen levothyroxine (SYNTHROID, LEVOTHROID) 75 MCG tablet Take 1 tablet (75 mcg total) by mouth daily. One day a week take 1/2 tablet (37.5mg ) 90 tablet 3   No facility-administered medications prior to visit.      Per HPI unless specifically indicated in ROS section below Review of Systems     Objective:    BP 138/70 (BP Location: Left Arm, Patient Position: Sitting, Cuff Size: Normal)   Pulse 81   Temp 98.1 F (36.7 C) (Oral)   Ht 5' (1.524 m)   Wt 146 lb (66.2 kg)   SpO2 95%   BMI 28.51 kg/m   Wt Readings from Last 3 Encounters:  03/07/18 146 lb (66.2 kg)  03/02/18 144 lb (65.3 kg)  05/28/17 144 lb (65.3 kg)    Physical Exam  Constitutional: She is oriented to person, place, and time. She appears well-developed and well-nourished. No distress.  HENT:  Head: Normocephalic and atraumatic.  Right Ear: Hearing, tympanic membrane, external ear and ear canal normal.  Left Ear: Hearing,  tympanic membrane, external ear and ear canal normal.  Nose: Nose normal.  Mouth/Throat: Uvula is midline, oropharynx is clear and moist and mucous membranes are normal. No oropharyngeal exudate, posterior oropharyngeal edema or posterior oropharyngeal erythema.  Eyes: Pupils are equal, round, and reactive to light. Conjunctivae and EOM are normal. No scleral icterus.  Neck: Normal range of motion. Neck supple. Carotid bruit is not present. No thyromegaly present.  Cardiovascular: Normal rate, regular rhythm, normal heart sounds and intact distal pulses.  No murmur heard. Pulses:      Radial pulses are 2+ on the right side, and 2+ on  the left side.  Pulmonary/Chest: Effort normal and breath sounds normal. No respiratory distress. She has no wheezes. She has no rales.  Abdominal: Soft. Bowel sounds are normal. She exhibits no distension and no mass. There is no tenderness. There is no rebound and no guarding.  Musculoskeletal: Normal range of motion. She exhibits no edema.  Lymphadenopathy:    She has no cervical adenopathy.  Neurological: She is alert and oriented to person, place, and time.  CN grossly intact, station and gait intact  Skin: Skin is warm and dry. No rash noted.  Psychiatric: She has a normal mood and affect. Her behavior is normal. Judgment and thought content normal.  Nursing note and vitals reviewed.  Results for orders placed or performed in visit on 03/02/18  T4, free  Result Value Ref Range   Free T4 0.83 0.60 - 1.60 ng/dL  TSH  Result Value Ref Range   TSH 1.65 0.35 - 4.50 uIU/mL  Comprehensive metabolic panel  Result Value Ref Range   Sodium 140 135 - 145 mEq/L   Potassium 4.8 3.5 - 5.1 mEq/L   Chloride 104 96 - 112 mEq/L   CO2 30 19 - 32 mEq/L   Glucose, Bld 97 70 - 99 mg/dL   BUN 14 6 - 23 mg/dL   Creatinine, Ser 0.76 0.40 - 1.20 mg/dL   Total Bilirubin 0.5 0.2 - 1.2 mg/dL   Alkaline Phosphatase 45 39 - 117 U/L   AST 20 0 - 37 U/L   ALT 15 0 - 35 U/L   Total Protein 7.6 6.0 - 8.3 g/dL   Albumin 4.1 3.5 - 5.2 g/dL   Calcium 9.8 8.4 - 10.5 mg/dL   GFR 78.34 >60.00 mL/min  Lipid panel  Result Value Ref Range   Cholesterol 186 0 - 200 mg/dL   Triglycerides 153.0 (H) 0.0 - 149.0 mg/dL   HDL 46.00 >39.00 mg/dL   VLDL 30.6 0.0 - 40.0 mg/dL   LDL Cholesterol 110 (H) 0 - 99 mg/dL   Total CHOL/HDL Ratio 4    NonHDL 140.24       Assessment & Plan:  Based on most recent evidence on limited aspirin benefit for primary cardiovascular prevention, rec back off aspirin to MWF dosing.  Problem List Items Addressed This Visit    Advanced care planning/counseling discussion    Advanced  directive discussion - does not have set up but has talked to children to let he wishes known. HCPOA would be 2 children.       BREAST CANCER, HX OF    Remote history. Sees onc yearly. Appreciate their care.       Dyslipidemia    Chronic, stable off meds. Continue to monitor. The 10-year ASCVD risk score Mikey Bussing DC Jr., et al., 2013) is: 22.1%   Values used to calculate the score:     Age: 72 years  Sex: Female     Is Non-Hispanic African American: No     Diabetic: No     Tobacco smoker: No     Systolic Blood Pressure: 222 mmHg     Is BP treated: No     HDL Cholesterol: 46 mg/dL     Total Cholesterol: 186 mg/dL       Hypothyroidism    Chronic, stable. Continue current regimen.       Relevant Medications   levothyroxine (SYNTHROID, LEVOTHROID) 75 MCG tablet   Osteopenia    Reviewed calcium/vit D dosing. Encouraged regular weight bearing exercise - advised to increase walking routine.           Meds ordered this encounter  Medications  . aspirin 81 MG tablet    Sig: Take 1 tablet (81 mg total) by mouth every Monday, Wednesday, and Friday.    Dispense:  30 tablet  . levothyroxine (SYNTHROID, LEVOTHROID) 75 MCG tablet    Sig: Take 1 tablet (75 mcg total) by mouth daily. One day a week take 1/2 tablet (37.5mg )    Dispense:  90 tablet    Refill:  3    Note new sig   No orders of the defined types were placed in this encounter.   Follow up plan: Return in about 1 year (around 03/08/2019), or if symptoms worsen or fail to improve, for medicare wellness visit, follow up visit.  Ria Bush, MD

## 2018-03-07 NOTE — Assessment & Plan Note (Signed)
Remote history. Sees onc yearly. Appreciate their care.

## 2018-03-07 NOTE — Patient Instructions (Addendum)
If interested, check with pharmacy about new 2 shot shingles series (shingrix).  Decrease aspirin to 3 times a week.  You are doing well today.  Return as needed or in 1 year for next physical.   Health Maintenance, Female Adopting a healthy lifestyle and getting preventive care can go a long way to promote health and wellness. Talk with your health care provider about what schedule of regular examinations is right for you. This is a good chance for you to check in with your provider about disease prevention and staying healthy. In between checkups, there are plenty of things you can do on your own. Experts have done a lot of research about which lifestyle changes and preventive measures are most likely to keep you healthy. Ask your health care provider for more information. Weight and diet Eat a healthy diet  Be sure to include plenty of vegetables, fruits, low-fat dairy products, and lean protein.  Do not eat a lot of foods high in solid fats, added sugars, or salt.  Get regular exercise. This is one of the most important things you can do for your health. ? Most adults should exercise for at least 150 minutes each week. The exercise should increase your heart rate and make you sweat (moderate-intensity exercise). ? Most adults should also do strengthening exercises at least twice a week. This is in addition to the moderate-intensity exercise.  Maintain a healthy weight  Body mass index (BMI) is a measurement that can be used to identify possible weight problems. It estimates body fat based on height and weight. Your health care provider can help determine your BMI and help you achieve or maintain a healthy weight.  For females 77 years of age and older: ? A BMI below 18.5 is considered underweight. ? A BMI of 18.5 to 24.9 is normal. ? A BMI of 25 to 29.9 is considered overweight. ? A BMI of 30 and above is considered obese.  Watch levels of cholesterol and blood lipids  You should  start having your blood tested for lipids and cholesterol at 78 years of age, then have this test every 5 years.  You may need to have your cholesterol levels checked more often if: ? Your lipid or cholesterol levels are high. ? You are older than 78 years of age. ? You are at high risk for heart disease.  Cancer screening Lung Cancer  Lung cancer screening is recommended for adults 71-63 years old who are at high risk for lung cancer because of a history of smoking.  A yearly low-dose CT scan of the lungs is recommended for people who: ? Currently smoke. ? Have quit within the past 15 years. ? Have at least a 30-pack-year history of smoking. A pack year is smoking an average of one pack of cigarettes a day for 1 year.  Yearly screening should continue until it has been 15 years since you quit.  Yearly screening should stop if you develop a health problem that would prevent you from having lung cancer treatment.  Breast Cancer  Practice breast self-awareness. This means understanding how your breasts normally appear and feel.  It also means doing regular breast self-exams. Let your health care provider know about any changes, no matter how small.  If you are in your 20s or 30s, you should have a clinical breast exam (CBE) by a health care provider every 1-3 years as part of a regular health exam.  If you are 40 or older, have  a CBE every year. Also consider having a breast X-ray (mammogram) every year.  If you have a family history of breast cancer, talk to your health care provider about genetic screening.  If you are at high risk for breast cancer, talk to your health care provider about having an MRI and a mammogram every year.  Breast cancer gene (BRCA) assessment is recommended for women who have family members with BRCA-related cancers. BRCA-related cancers include: ? Breast. ? Ovarian. ? Tubal. ? Peritoneal cancers.  Results of the assessment will determine the need  for genetic counseling and BRCA1 and BRCA2 testing.  Cervical Cancer Your health care provider may recommend that you be screened regularly for cancer of the pelvic organs (ovaries, uterus, and vagina). This screening involves a pelvic examination, including checking for microscopic changes to the surface of your cervix (Pap test). You may be encouraged to have this screening done every 3 years, beginning at age 109.  For women ages 50-65, health care providers may recommend pelvic exams and Pap testing every 3 years, or they may recommend the Pap and pelvic exam, combined with testing for human papilloma virus (HPV), every 5 years. Some types of HPV increase your risk of cervical cancer. Testing for HPV may also be done on women of any age with unclear Pap test results.  Other health care providers may not recommend any screening for nonpregnant women who are considered low risk for pelvic cancer and who do not have symptoms. Ask your health care provider if a screening pelvic exam is right for you.  If you have had past treatment for cervical cancer or a condition that could lead to cancer, you need Pap tests and screening for cancer for at least 20 years after your treatment. If Pap tests have been discontinued, your risk factors (such as having a new sexual partner) need to be reassessed to determine if screening should resume. Some women have medical problems that increase the chance of getting cervical cancer. In these cases, your health care provider may recommend more frequent screening and Pap tests.  Colorectal Cancer  This type of cancer can be detected and often prevented.  Routine colorectal cancer screening usually begins at 78 years of age and continues through 78 years of age.  Your health care provider may recommend screening at an earlier age if you have risk factors for colon cancer.  Your health care provider may also recommend using home test kits to check for hidden blood in  the stool.  A small camera at the end of a tube can be used to examine your colon directly (sigmoidoscopy or colonoscopy). This is done to check for the earliest forms of colorectal cancer.  Routine screening usually begins at age 22.  Direct examination of the colon should be repeated every 5-10 years through 78 years of age. However, you may need to be screened more often if early forms of precancerous polyps or small growths are found.  Skin Cancer  Check your skin from head to toe regularly.  Tell your health care provider about any new moles or changes in moles, especially if there is a change in a mole's shape or color.  Also tell your health care provider if you have a mole that is larger than the size of a pencil eraser.  Always use sunscreen. Apply sunscreen liberally and repeatedly throughout the day.  Protect yourself by wearing long sleeves, pants, a wide-brimmed hat, and sunglasses whenever you are outside.  Heart  disease, diabetes, and high blood pressure  High blood pressure causes heart disease and increases the risk of stroke. High blood pressure is more likely to develop in: ? People who have blood pressure in the high end of the normal range (130-139/85-89 mm Hg). ? People who are overweight or obese. ? People who are African American.  If you are 86-35 years of age, have your blood pressure checked every 3-5 years. If you are 73 years of age or older, have your blood pressure checked every year. You should have your blood pressure measured twice-once when you are at a hospital or clinic, and once when you are not at a hospital or clinic. Record the average of the two measurements. To check your blood pressure when you are not at a hospital or clinic, you can use: ? An automated blood pressure machine at a pharmacy. ? A home blood pressure monitor.  If you are between 28 years and 37 years old, ask your health care provider if you should take aspirin to prevent  strokes.  Have regular diabetes screenings. This involves taking a blood sample to check your fasting blood sugar level. ? If you are at a normal weight and have a low risk for diabetes, have this test once every three years after 78 years of age. ? If you are overweight and have a high risk for diabetes, consider being tested at a younger age or more often. Preventing infection Hepatitis B  If you have a higher risk for hepatitis B, you should be screened for this virus. You are considered at high risk for hepatitis B if: ? You were born in a country where hepatitis B is common. Ask your health care provider which countries are considered high risk. ? Your parents were born in a high-risk country, and you have not been immunized against hepatitis B (hepatitis B vaccine). ? You have HIV or AIDS. ? You use needles to inject street drugs. ? You live with someone who has hepatitis B. ? You have had sex with someone who has hepatitis B. ? You get hemodialysis treatment. ? You take certain medicines for conditions, including cancer, organ transplantation, and autoimmune conditions.  Hepatitis C  Blood testing is recommended for: ? Everyone born from [birth year] through 1966. ? Anyone with known risk factors for hepatitis C.  Sexually transmitted infections (STIs)  You should be screened for sexually transmitted infections (STIs) including gonorrhea and chlamydia if: ? You are sexually active and are younger than 78 years of age. ? You are older than 78 years of age and your health care provider tells you that you are at risk for this type of infection. ? Your sexual activity has changed since you were last screened and you are at an increased risk for chlamydia or gonorrhea. Ask your health care provider if you are at risk.  If you do not have HIV, but are at risk, it may be recommended that you take a prescription medicine daily to prevent HIV infection. This is called pre-exposure prophylaxis  (PrEP). You are considered at risk if: ? You are sexually active and do not regularly use condoms or know the HIV status of your partner(s). ? You take drugs by injection. ? You are sexually active with a partner who has HIV.  Talk with your health care provider about whether you are at high risk of being infected with HIV. If you choose to begin PrEP, you should first be tested for HIV. You  should then be tested every 3 months for as long as you are taking PrEP. Pregnancy  If you are premenopausal and you may become pregnant, ask your health care provider about preconception counseling.  If you may become pregnant, take 400 to 800 micrograms (mcg) of folic acid every day.  If you want to prevent pregnancy, talk to your health care provider about birth control (contraception). Osteoporosis and menopause  Osteoporosis is a disease in which the bones lose minerals and strength with aging. This can result in serious bone fractures. Your risk for osteoporosis can be identified using a bone density scan.  If you are 69 years of age or older, or if you are at risk for osteoporosis and fractures, ask your health care provider if you should be screened.  Ask your health care provider whether you should take a calcium or vitamin D supplement to lower your risk for osteoporosis.  Menopause may have certain physical symptoms and risks.  Hormone replacement therapy may reduce some of these symptoms and risks. Talk to your health care provider about whether hormone replacement therapy is right for you. Follow these instructions at home:  Schedule regular health, dental, and eye exams.  Stay current with your immunizations.  Do not use any tobacco products including cigarettes, chewing tobacco, or electronic cigarettes.  If you are pregnant, do not drink alcohol.  If you are breastfeeding, limit how much and how often you drink alcohol.  Limit alcohol intake to no more than 1 drink per day for  nonpregnant women. One drink equals 12 ounces of beer, 5 ounces of wine, or 1 ounces of hard liquor.  Do not use street drugs.  Do not share needles.  Ask your health care provider for help if you need support or information about quitting drugs.  Tell your health care provider if you often feel depressed.  Tell your health care provider if you have ever been abused or do not feel safe at home. This information is not intended to replace advice given to you by your health care provider. Make sure you discuss any questions you have with your health care provider. Document Released: 06/01/2011 Document Revised: 04/23/2016 Document Reviewed: 08/20/2015 Elsevier Interactive Patient Education  [employer].

## 2018-03-07 NOTE — Assessment & Plan Note (Signed)
Chronic, stable. Continue current regimen. 

## 2018-03-07 NOTE — Assessment & Plan Note (Signed)
Reviewed calcium/vit D dosing. Encouraged regular weight bearing exercise - advised to increase walking routine.

## 2018-03-07 NOTE — Assessment & Plan Note (Signed)
Chronic, stable off meds. Continue to monitor. The 10-year ASCVD risk score Mikey Bussing DC Brooke Bonito., et al., 2013) is: 22.1%   Values used to calculate the score:     Age: 77 years     Sex: Female     Is Non-Hispanic African American: No     Diabetic: No     Tobacco smoker: No     Systolic Blood Pressure: 343 mmHg     Is BP treated: No     HDL Cholesterol: 46 mg/dL     Total Cholesterol: 186 mg/dL

## 2018-03-07 NOTE — Assessment & Plan Note (Signed)
Advanced directive discussion - does not have set up but has talked to children to let he wishes known. HCPOA would be 2 children.

## 2018-04-05 DIAGNOSIS — R921 Mammographic calcification found on diagnostic imaging of breast: Secondary | ICD-10-CM | POA: Diagnosis not present

## 2018-04-05 DIAGNOSIS — M85852 Other specified disorders of bone density and structure, left thigh: Secondary | ICD-10-CM | POA: Diagnosis not present

## 2018-04-05 DIAGNOSIS — R922 Inconclusive mammogram: Secondary | ICD-10-CM | POA: Diagnosis not present

## 2018-04-05 DIAGNOSIS — Z853 Personal history of malignant neoplasm of breast: Secondary | ICD-10-CM | POA: Diagnosis not present

## 2018-04-06 ENCOUNTER — Encounter: Payer: Self-pay | Admitting: Family Medicine

## 2018-04-21 ENCOUNTER — Encounter: Payer: Self-pay | Admitting: Family Medicine

## 2018-05-27 DIAGNOSIS — H401132 Primary open-angle glaucoma, bilateral, moderate stage: Secondary | ICD-10-CM | POA: Diagnosis not present

## 2018-05-28 DIAGNOSIS — M25551 Pain in right hip: Secondary | ICD-10-CM | POA: Diagnosis not present

## 2018-09-08 ENCOUNTER — Encounter: Payer: Self-pay | Admitting: Hematology & Oncology

## 2018-09-08 ENCOUNTER — Inpatient Hospital Stay: Payer: Medicare Other | Attending: Hematology & Oncology | Admitting: Hematology & Oncology

## 2018-09-08 ENCOUNTER — Other Ambulatory Visit: Payer: Self-pay

## 2018-09-08 ENCOUNTER — Inpatient Hospital Stay: Payer: Medicare Other

## 2018-09-08 VITALS — BP 150/65 | HR 75 | Temp 98.6°F | Resp 16 | Wt 142.0 lb

## 2018-09-08 DIAGNOSIS — Z853 Personal history of malignant neoplasm of breast: Secondary | ICD-10-CM | POA: Insufficient documentation

## 2018-09-08 DIAGNOSIS — Z23 Encounter for immunization: Secondary | ICD-10-CM | POA: Insufficient documentation

## 2018-09-08 DIAGNOSIS — M8588 Other specified disorders of bone density and structure, other site: Secondary | ICD-10-CM

## 2018-09-08 DIAGNOSIS — E559 Vitamin D deficiency, unspecified: Secondary | ICD-10-CM

## 2018-09-08 LAB — CBC WITH DIFFERENTIAL (CANCER CENTER ONLY)
Abs Immature Granulocytes: 0.01 10*3/uL (ref 0.00–0.07)
BASOS ABS: 0 10*3/uL (ref 0.0–0.1)
BASOS PCT: 1 %
EOS ABS: 0.2 10*3/uL (ref 0.0–0.5)
EOS PCT: 4 %
HCT: 46.1 % — ABNORMAL HIGH (ref 36.0–46.0)
Hemoglobin: 14.7 g/dL (ref 12.0–15.0)
IMMATURE GRANULOCYTES: 0 %
LYMPHS ABS: 1.7 10*3/uL (ref 0.7–4.0)
Lymphocytes Relative: 26 %
MCH: 29.3 pg (ref 26.0–34.0)
MCHC: 31.9 g/dL (ref 30.0–36.0)
MCV: 91.8 fL (ref 80.0–100.0)
Monocytes Absolute: 0.9 10*3/uL (ref 0.1–1.0)
Monocytes Relative: 14 %
NEUTROS PCT: 55 %
NRBC: 0 % (ref 0.0–0.2)
Neutro Abs: 3.8 10*3/uL (ref 1.7–7.7)
PLATELETS: 212 10*3/uL (ref 150–400)
RBC: 5.02 MIL/uL (ref 3.87–5.11)
RDW: 13.3 % (ref 11.5–15.5)
WBC Count: 6.7 10*3/uL (ref 4.0–10.5)

## 2018-09-08 LAB — CMP (CANCER CENTER ONLY)
ALBUMIN: 4.1 g/dL (ref 3.5–5.0)
ALT: 22 U/L (ref 0–44)
ANION GAP: 11 (ref 5–15)
AST: 28 U/L (ref 15–41)
Alkaline Phosphatase: 50 U/L (ref 38–126)
BILIRUBIN TOTAL: 0.8 mg/dL (ref 0.3–1.2)
BUN: 18 mg/dL (ref 8–23)
CHLORIDE: 106 mmol/L (ref 98–111)
CO2: 25 mmol/L (ref 22–32)
Calcium: 10.3 mg/dL (ref 8.9–10.3)
Creatinine: 0.83 mg/dL (ref 0.44–1.00)
GFR, Est AFR Am: >60 mL/min (ref >60)
GFR, Estimated: >60 mL/min (ref >60)
Glucose, Bld: 94 mg/dL (ref 70–99)
POTASSIUM: 4.4 mmol/L (ref 3.5–5.1)
Sodium: 142 mmol/L (ref 135–145)
TOTAL PROTEIN: 7.9 g/dL (ref 6.5–8.1)

## 2018-09-08 MED ORDER — INFLUENZA VAC SPLIT QUAD 0.5 ML IM SUSY
0.5000 mL | PREFILLED_SYRINGE | Freq: Once | INTRAMUSCULAR | Status: AC
  Administered 2018-09-08: 0.5 mL via INTRAMUSCULAR

## 2018-09-08 MED ORDER — INFLUENZA VAC SPLIT QUAD 0.5 ML IM SUSY
PREFILLED_SYRINGE | INTRAMUSCULAR | Status: AC
  Filled 2018-09-08: qty 0.5

## 2018-09-08 NOTE — Progress Notes (Signed)
Hematology and Oncology Follow Up Visit  Joanna White [id number] 01/06/1941 77 y.o. 09/08/2018   Principle Diagnosis:  1. 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   Current Therapy:   Observation    Interim History: Joanna White is here today for her annual visit.  So far, everything is going quite well.  We see her once a year.  Her family has been doing pretty well.  She has had no issues with the family and that is a nice thing.  She is painting quite a bit.  She is into chalk paint.  She has done some furniture.  She did her kitchen cabinets.  She really enjoys doing this.  She has had no problems with cough or shortness of breath.  She has had no rashes.  There is been no change in bowel or bladder habits.  She is had no nausea or vomiting.  There is been no bleeding.  She will get her flu shot today.    She has a performance status of ECOG 0.   Medications:  Allergies as of 09/08/2018      Reactions   Flonase  [fluticasone Propionate] Anaphylaxis   Fluticasone Propionate    REACTION: throat tight      Medication List        Accurate as of 09/08/18 11:32 AM. Always use your most recent med list.          Calcium-Vitamin D 600-400 MG-UNIT Tabs Take 2 tablets by mouth daily.   latanoprost 0.005 % ophthalmic solution Commonly known as:  XALATAN Place into both eyes at bedtime.   levothyroxine 75 MCG tablet Commonly known as:  SYNTHROID, LEVOTHROID Take 1 tablet (75 mcg total) by mouth daily. One day a week take 1/2 tablet (37.5mg )   multivitamin tablet Take 1 tablet by mouth daily.   Vitamin D3 1000 units Caps Take 1 capsule (1,000 Units total) by mouth daily.       Allergies:  Allergies  Allergen Reactions  . Flonase  [Fluticasone Propionate] Anaphylaxis  . Fluticasone Propionate     REACTION: throat tight    Past Medical History, Surgical history, Social history, and Family History were reviewed and updated.  Review  of Systems: Review of Systems  Constitutional: Negative.   HENT: Negative.   Eyes: Negative.   Respiratory: Negative.   Cardiovascular: Negative.  Negative for chest pain.  Gastrointestinal: Negative.   Genitourinary: Negative.   Musculoskeletal: Negative.   Skin: Negative.   Neurological: Negative.   Endo/Heme/Allergies: Negative.   Psychiatric/Behavioral: Negative.      Physical Exam:  weight is 142 lb (64.4 kg). Her oral temperature is 98.6 F (37 C). Her blood pressure is 150/65 (abnormal) and her pulse is 75. Her respiration is 16 and oxygen saturation is 98%.   Wt Readings from Last 3 Encounters:  09/08/18 142 lb (64.4 kg)  03/07/18 146 lb (66.2 kg)  03/02/18 144 lb (65.3 kg)    Well-developed and well-nourished white female in no obvious distress. Head and neck exam shows no ocular or oral lesions. There are no palpable cervical or supraclavicular lymph nodes. Lungs are clear bilaterally. Cardiac exam regular rate and rhythm with no murmurs, rubs or bruits. Breast exam shows right breast with no masses, edema or erythema. There is no right axillary adenopathy. Left breast shows well-healed lumpectomy at the 11:00 position. She has some slight firmness of the lumpectomy site. There is no distinct mass in the left breast. There  is no left axillary adenopathy. Abdomen is soft. She has good bowel sounds. There is no fluid wave. There is no palpable liver or spleen tip. Extremities shows no clubbing, cyanosis or edema. She has good range of motion of her joints. There is no lymphedema in her left arm. Skin exam shows no rashes, ecchymoses or petechia. Neurological exam shows no focal neurological deficits.    Lab Results  Component Value Date   WBC 6.7 09/08/2018   HGB 14.7 09/08/2018   HCT 46.1 (H) 09/08/2018   MCV 91.8 09/08/2018   PLT 212 09/08/2018   No results found for: FERRITIN, IRON, TIBC, UIBC, IRONPCTSAT Lab Results  Component Value Date   RBC 5.02 09/08/2018    No results found for: KPAFRELGTCHN, LAMBDASER, KAPLAMBRATIO No results found for: IGGSERUM, IGA, IGMSERUM No results found for: Odetta Pink, SPEI   Chemistry      Component Value Date/Time   NA 140 03/02/2018 1039   NA 142 09/08/2017 1028   K 4.8 03/02/2018 1039   K 4.5 09/08/2017 1028   CL 104 03/02/2018 1039   CL 103 09/14/2014 1111   CO2 30 03/02/2018 1039   CO2 26 09/08/2017 1028   BUN 14 03/02/2018 1039   BUN 18.9 09/08/2017 1028   CREATININE 0.76 03/02/2018 1039   CREATININE 1.2 (H) 09/08/2017 1028      Component Value Date/Time   CALCIUM 9.8 03/02/2018 1039   CALCIUM 9.8 09/08/2017 1028   ALKPHOS 45 03/02/2018 1039   ALKPHOS 57 09/08/2017 1028   AST 20 03/02/2018 1039   AST 21 09/08/2017 1028   ALT 15 03/02/2018 1039   ALT 15 09/08/2017 1028   BILITOT 0.5 03/02/2018 1039   BILITOT 0.57 09/08/2017 1028     Impression and Plan: Joanna White is a very pleasant 77 yo white female with history of stage IIA ductal carcinoma of the left breast treated with adjuvant chemotherapy with [facility]. Her tumor was ER negative. She is now going on 21 years out and has done quite well.   From my point of view, he everything looks fantastic. I do not see any obvious recurrence. However, she is at risk for recurrence although I think the risk is probably less than 5%.  She does like to see [place]. She does not mind seeing [place] yearly. She feels that we give her peace of mind as we give her a thorough examinations.   Volanda Napoleon, MD 10/10/201911:32 AM

## 2018-09-09 ENCOUNTER — Telehealth: Payer: Self-pay | Admitting: *Deleted

## 2018-09-09 LAB — VITAMIN D 25 HYDROXY (VIT D DEFICIENCY, FRACTURES): Vit D, 25-Hydroxy: 48.2 ng/mL (ref 30.0–100.0)

## 2018-09-09 NOTE — Telephone Encounter (Addendum)
Patient is aware of results  ----- Message from Volanda Napoleon, MD sent at 09/08/2018  2:41 PM EDT ----- Call - labs look great!!!  Laurey Arrow

## 2018-09-13 DIAGNOSIS — H401132 Primary open-angle glaucoma, bilateral, moderate stage: Secondary | ICD-10-CM | POA: Diagnosis not present

## 2018-10-06 DIAGNOSIS — R921 Mammographic calcification found on diagnostic imaging of breast: Secondary | ICD-10-CM | POA: Diagnosis not present

## 2018-10-06 DIAGNOSIS — Z853 Personal history of malignant neoplasm of breast: Secondary | ICD-10-CM | POA: Diagnosis not present

## 2018-10-06 LAB — HM MAMMOGRAPHY

## 2018-10-10 ENCOUNTER — Encounter: Payer: Self-pay | Admitting: Family Medicine

## 2018-10-28 ENCOUNTER — Encounter: Payer: Self-pay | Admitting: Hematology & Oncology

## 2018-12-15 ENCOUNTER — Other Ambulatory Visit: Payer: Self-pay | Admitting: Family Medicine

## 2019-01-26 DIAGNOSIS — H401132 Primary open-angle glaucoma, bilateral, moderate stage: Secondary | ICD-10-CM | POA: Diagnosis not present

## 2019-03-07 ENCOUNTER — Ambulatory Visit: Payer: Self-pay

## 2019-03-11 ENCOUNTER — Other Ambulatory Visit: Payer: Self-pay | Admitting: Family Medicine

## 2019-03-13 NOTE — Telephone Encounter (Signed)
E-scribed refill 

## 2019-03-14 ENCOUNTER — Encounter: Payer: Self-pay | Admitting: Family Medicine

## 2019-07-03 DIAGNOSIS — Z961 Presence of intraocular lens: Secondary | ICD-10-CM | POA: Diagnosis not present

## 2019-07-03 DIAGNOSIS — H401132 Primary open-angle glaucoma, bilateral, moderate stage: Secondary | ICD-10-CM | POA: Diagnosis not present

## 2019-07-09 ENCOUNTER — Other Ambulatory Visit: Payer: Self-pay | Admitting: Family Medicine

## 2019-07-19 ENCOUNTER — Telehealth: Payer: Self-pay

## 2019-07-19 NOTE — Telephone Encounter (Signed)
Attempted to return pt's call.  No answer.  Vm box not set up yet.  I don't see any messages/results that we may have called about.  But I do see she has an upcoming wellness appt.

## 2019-07-19 NOTE — Telephone Encounter (Signed)
[facility] [place] Night - Client Nonclinical Telephone Record AccessNurse Client [place] Night - Client Client Site [place] Physician Ria Bush - MD Contact Type Call Who Is Calling Patient / Member / Family / Caregiver Caller Name [place] Phone Number [phone] Call Type Message Only Information Provided Reason for Call Returning a Call from the Office Initial [place] returning a call from the office. Additional Comment Office hours provided. Caller thinks it was concerning a wellness check appointment. Call Closed By: Marinus Maw Transaction Date/Time: 07/18/2019 5:09:05 PM (ET)

## 2019-07-25 ENCOUNTER — Other Ambulatory Visit: Payer: Self-pay | Admitting: Family Medicine

## 2019-07-25 DIAGNOSIS — E785 Hyperlipidemia, unspecified: Secondary | ICD-10-CM

## 2019-07-25 DIAGNOSIS — E039 Hypothyroidism, unspecified: Secondary | ICD-10-CM

## 2019-07-26 ENCOUNTER — Other Ambulatory Visit: Payer: Self-pay

## 2019-07-26 ENCOUNTER — Ambulatory Visit: Payer: Medicare Other

## 2019-07-26 ENCOUNTER — Other Ambulatory Visit (INDEPENDENT_AMBULATORY_CARE_PROVIDER_SITE_OTHER): Payer: Medicare Other

## 2019-07-26 DIAGNOSIS — E039 Hypothyroidism, unspecified: Secondary | ICD-10-CM | POA: Diagnosis not present

## 2019-07-26 DIAGNOSIS — E785 Hyperlipidemia, unspecified: Secondary | ICD-10-CM | POA: Diagnosis not present

## 2019-07-26 LAB — LIPID PANEL
Cholesterol: 227 mg/dL — ABNORMAL HIGH (ref 0–200)
HDL: 47.6 mg/dL (ref >39.00)
LDL Cholesterol: 153 mg/dL — ABNORMAL HIGH (ref 0–99)
NonHDL: 179.58
Total CHOL/HDL Ratio: 5
Triglycerides: 133 mg/dL (ref 0.0–149.0)
VLDL: 26.6 mg/dL (ref 0.0–40.0)

## 2019-07-26 LAB — COMPREHENSIVE METABOLIC PANEL
ALT: 17 U/L (ref 0–35)
AST: 20 U/L (ref 0–37)
Albumin: 4.2 g/dL (ref 3.5–5.2)
Alkaline Phosphatase: 41 U/L (ref 39–117)
BUN: 17 mg/dL (ref 6–23)
CO2: 27 mEq/L (ref 19–32)
Calcium: 9.4 mg/dL (ref 8.4–10.5)
Chloride: 105 mEq/L (ref 96–112)
Creatinine, Ser: 0.84 mg/dL (ref 0.40–1.20)
GFR: 65.43 mL/min (ref >60.00)
Glucose, Bld: 80 mg/dL (ref 70–99)
Potassium: 4.1 mEq/L (ref 3.5–5.1)
Sodium: 141 mEq/L (ref 135–145)
Total Bilirubin: 0.6 mg/dL (ref 0.2–1.2)
Total Protein: 7.3 g/dL (ref 6.0–8.3)

## 2019-07-26 LAB — TSH: TSH: 2.12 u[IU]/mL (ref 0.35–4.50)

## 2019-08-04 ENCOUNTER — Ambulatory Visit (INDEPENDENT_AMBULATORY_CARE_PROVIDER_SITE_OTHER)
Admission: RE | Admit: 2019-08-04 | Discharge: 2019-08-04 | Disposition: A | Discharge status: Home / Self Care | Payer: Medicare Other | Source: Ambulatory Visit | Attending: Family Medicine | Admitting: Family Medicine

## 2019-08-04 ENCOUNTER — Encounter: Payer: Self-pay | Admitting: Family Medicine

## 2019-08-04 ENCOUNTER — Other Ambulatory Visit: Payer: Self-pay

## 2019-08-04 ENCOUNTER — Ambulatory Visit (INDEPENDENT_AMBULATORY_CARE_PROVIDER_SITE_OTHER): Payer: Medicare Other | Admitting: Family Medicine

## 2019-08-04 VITALS — BP 152/80 | HR 86 | Temp 98.1°F | Ht 60.0 in | Wt 143.4 lb

## 2019-08-04 DIAGNOSIS — Z853 Personal history of malignant neoplasm of breast: Secondary | ICD-10-CM

## 2019-08-04 DIAGNOSIS — E039 Hypothyroidism, unspecified: Secondary | ICD-10-CM | POA: Diagnosis not present

## 2019-08-04 DIAGNOSIS — M19011 Primary osteoarthritis, right shoulder: Secondary | ICD-10-CM | POA: Diagnosis not present

## 2019-08-04 DIAGNOSIS — Z0000 Encounter for general adult medical examination without abnormal findings: Secondary | ICD-10-CM | POA: Insufficient documentation

## 2019-08-04 DIAGNOSIS — M89319 Hypertrophy of bone, unspecified shoulder: Secondary | ICD-10-CM | POA: Diagnosis not present

## 2019-08-04 DIAGNOSIS — M8588 Other specified disorders of bone density and structure, other site: Secondary | ICD-10-CM | POA: Diagnosis not present

## 2019-08-04 DIAGNOSIS — Z7189 Other specified counseling: Secondary | ICD-10-CM

## 2019-08-04 DIAGNOSIS — Z23 Encounter for immunization: Secondary | ICD-10-CM | POA: Diagnosis not present

## 2019-08-04 DIAGNOSIS — E785 Hyperlipidemia, unspecified: Secondary | ICD-10-CM | POA: Diagnosis not present

## 2019-08-04 MED ORDER — LEVOTHYROXINE SODIUM 75 MCG PO TABS: ORAL_TABLET | ORAL | 3 refills | Status: DC

## 2019-08-04 NOTE — Patient Instructions (Addendum)
Flu shot today We will sign you up for cologuard.  If interested, check with pharmacy about new 2 shot shingles series (shingrix).  Advanced directive packet provided today.  You have an enlarged clavicle on the right. We will check xray today for further evaluation. Let me know if enlarging  Health Maintenance After Age 79 After age 15, you are at a higher risk for certain long-term diseases and infections as well as injuries from falls. Falls are a major cause of broken bones and head injuries in people who are older than age 64. Getting regular preventive care can help to keep you healthy and well. Preventive care includes getting regular testing and making lifestyle changes as recommended by your health care provider. Talk with your health care provider about:  Which screenings and tests you should have. A screening is a test that checks for a disease when you have no symptoms.  A diet and exercise plan that is right for you. What should I know about screenings and tests to prevent falls? Screening and testing are the best ways to find a health problem early. Early diagnosis and treatment give you the best chance of managing medical conditions that are common after age 82. Certain conditions and lifestyle choices may make you more likely to have a fall. Your health care provider may recommend:  Regular vision checks. Poor vision and conditions such as cataracts can make you more likely to have a fall. If you wear glasses, make sure to get your prescription updated if your vision changes.  Medicine review. Work with your health care provider to regularly review all of the medicines you are taking, including over-the-counter medicines. Ask your health care provider about any side effects that may make you more likely to have a fall. Tell your health care provider if any medicines that you take make you feel dizzy or sleepy.  Osteoporosis screening. Osteoporosis is a condition that causes the  bones to get weaker. This can make the bones weak and cause them to break more easily.  Blood pressure screening. Blood pressure changes and medicines to control blood pressure can make you feel dizzy.  Strength and balance checks. Your health care provider may recommend certain tests to check your strength and balance while standing, walking, or changing positions.  Foot health exam. Foot pain and numbness, as well as not wearing proper footwear, can make you more likely to have a fall.  Depression screening. You may be more likely to have a fall if you have a fear of falling, feel emotionally low, or feel unable to do activities that you used to do.  Alcohol use screening. Using too much alcohol can affect your balance and may make you more likely to have a fall. What actions can I take to lower my risk of falls? General instructions  Talk with your health care provider about your risks for falling. Tell your health care provider if: ? You fall. Be sure to tell your health care provider about all falls, even ones that seem minor. ? You feel dizzy, sleepy, or off-balance.  Take over-the-counter and prescription medicines only as told by your health care provider. These include any supplements.  Eat a healthy diet and maintain a healthy weight. A healthy diet includes low-fat dairy products, low-fat (lean) meats, and fiber from whole grains, beans, and lots of fruits and vegetables. Home safety  Remove any tripping hazards, such as rugs, cords, and clutter.  Install safety equipment such as grab  bars in bathrooms and safety rails on stairs.  Keep rooms and walkways well-lit. Activity   Follow a regular exercise program to stay fit. This will help you maintain your balance. Ask your health care provider what types of exercise are appropriate for you.  If you need a cane or walker, use it as recommended by your health care provider.  Wear supportive shoes that have nonskid  soles. Lifestyle  Do not drink alcohol if your health care provider tells you not to drink.  If you drink alcohol, limit how much you have: ? 0-1 drink a day for women. ? 0-2 drinks a day for men.  Be aware of how much alcohol is in your drink. In the [place] one drink equals one typical bottle of beer (12 oz), one-half glass of wine (5 oz), or one shot of hard liquor (1 oz).  Do not use any products that contain nicotine or tobacco, such as cigarettes and e-cigarettes. If you need help quitting, ask your health care provider. Summary  Having a healthy lifestyle and getting preventive care can help to protect your health and wellness after age 61.  Screening and testing are the best way to find a health problem early and help you avoid having a fall. Early diagnosis and treatment give you the best chance for managing medical conditions that are more common for people who are older than age 71.  Falls are a major cause of broken bones and head injuries in people who are older than age 87. Take precautions to prevent a fall at home.  Work with your health care provider to learn what changes you can make to improve your health and wellness and to prevent falls. This information is not intended to replace advice given to you by your health care provider. Make sure you discuss any questions you have with your health care provider. Document Released: 09/29/2017 Document Revised: 03/09/2019 Document Reviewed: 09/29/2017 Elsevier Patient Education  2020 [employer].

## 2019-08-04 NOTE — Assessment & Plan Note (Signed)
Latest dexa with improvement in bone density. Continue calcium in diet, vit D supplement and regular weight bearing exercises.

## 2019-08-04 NOTE — Progress Notes (Signed)
This visit was conducted in person.  BP (!) 152/80 (BP Location: Right Arm, Patient Position: Sitting, Cuff Size: Normal)   Pulse 86   Temp 98.1 F (36.7 C) (Temporal)   Ht 5' (1.524 m)   Wt 143 lb 7 oz (65.1 kg)   SpO2 97%   BMI 28.01 kg/m    CC: AMW Subjective:    Patient ID: Joanna White, female    DOB: November 20, 1941, 78 y.o.   MRN: [id number]  HPI: Joanna White is a 78 y.o. female presenting on 08/04/2019 for Medicare Wellness   Did not see health advisor this year.    Hearing Screening   125Hz  250Hz  500Hz  1000Hz  2000Hz  3000Hz  4000Hz  6000Hz  8000Hz   Right ear:   40 25 20  25     Left ear:   20 20 25   40    Vision Screening Comments: Last eye exam, 07/2019    Office Visit from 08/04/2019 in [place] at [facility]  PHQ-2 Total Score  0      Fall Risk  08/04/2019 03/02/2018 03/01/2017 09/11/2016 02/28/2016  Falls in the past year? 0 No No No No      Sees Dr Marin Olp yearly for h/o breast cancer.  Doesn't check BP at home.  4 wks ago while picking grand daughter up from daycare, felt she pulled her neck with residual knot at R head of clavicle. No redness, swelling, pain.  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 mammo Birads2 09/2018. Sees onc yearly.  Well woman exam -aged out. DEXA12/2016osteopenia, T -1.1 spine, hip -1.7, DEXA T -1.5 hip, -0.2 spine (improvement) 03/2018. Drinks 1 gallon milk/wk.  Lung cancer screening -never smoker  Flu shotyearly  Tetanus shot -unsure  Pneumovax 2015, prevnar 2017 zostavax - 2017. Shingrix - discussed.  Advanced directive discussion -does not have set up but has talked to children to let he wishes known. HCPOA would be 2 children. Packet provided today.  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 Interim medical history since our last visit reviewed. Allergies and medications reviewed and updated. Outpatient Medications Prior to Visit  Medication Sig Dispense Refill  . Cholecalciferol (VITAMIN D3) 1000 units CAPS Take 1 capsule (1,000 Units total) by mouth daily. 30 capsule   . latanoprost (XALATAN) 0.005 % ophthalmic solution Place into both eyes at bedtime.     . Multiple Vitamin (MULTIVITAMIN) tablet Take 1 tablet by mouth daily.      Marland Kitchen levothyroxine (SYNTHROID) 75 MCG tablet TAKE 1 TABLET BY MOUTH DAILY AND 1/2 TABLET ONE DAY A WEEK 90 tablet 0  . Calcium Carb-Cholecalciferol (CALCIUM-VITAMIN D) 600-400 MG-UNIT TABS Take 2 tablets by mouth daily.     No facility-administered medications prior to visit.      Per HPI unless specifically indicated in ROS section below Review of Systems Objective:    BP (!) 152/80 (BP Location: Right  Arm, Patient Position: Sitting, Cuff Size: Normal)   Pulse 86   Temp 98.1 F (36.7 C) (Temporal)   Ht 5' (1.524 m)   Wt 143 lb 7 oz (65.1 kg)   SpO2 97%   BMI 28.01 kg/m   Wt Readings from Last 3 Encounters:  08/04/19 143 lb 7 oz (65.1 kg)  09/08/18 142 lb (64.4 kg)  03/07/18 146 lb (66.2 kg)    Physical Exam Vitals signs and nursing note reviewed.  Constitutional:      General: She is not in acute distress.    Appearance: Normal appearance. She is well-developed. She is not ill-appearing.  HENT:     Head: Normocephalic and atraumatic.     Right Ear: Hearing, tympanic membrane, ear canal  and external ear normal.     Left Ear: Hearing, tympanic membrane, ear canal and external ear normal.     Nose: Nose normal.     Mouth/Throat:     Mouth: Mucous membranes are moist.     Pharynx: Uvula midline. No oropharyngeal exudate or posterior oropharyngeal erythema.  Eyes:     General: No scleral icterus.    Conjunctiva/sclera: Conjunctivae normal.     Pupils: Pupils are equal, round, and reactive to light.  Neck:     Musculoskeletal: Normal range of motion and neck supple.  Cardiovascular:     Rate and Rhythm: Normal rate and regular rhythm.     Pulses: Normal pulses.          Radial pulses are 2+ on the right side and 2+ on the left side.     Heart sounds: Normal heart sounds. No murmur.  Pulmonary:     Effort: Pulmonary effort is normal. No respiratory distress.     Breath sounds: Normal breath sounds. No wheezing, rhonchi or rales.  Chest:     Chest wall: No tenderness.       Comments: Enlargement of head of R clavicle without fluctuance, tenderness or erythema Abdominal:     General: Abdomen is flat. Bowel sounds are normal. There is no distension.     Palpations: Abdomen is soft. There is no mass.     Tenderness: There is no abdominal tenderness. There is no guarding or rebound.     Hernia: No hernia is present.  Musculoskeletal: Normal range of motion.  Lymphadenopathy:     Cervical: No cervical adenopathy.  Skin:    General: Skin is warm and dry.     Findings: No rash.  Neurological:     General: No focal deficit present.     Mental Status: She is alert and oriented to person, place, and time.     Comments:  CN grossly intact, station and gait intact Recall 2/3, 2/3 with cue Calculation 5/5 D-L-R-[id number]  Psychiatric:        Mood and Affect: Mood normal.        Behavior: Behavior normal.        Thought Content: Thought content normal.        Judgment: Judgment normal.       Results for orders placed or performed in visit on 07/26/19  TSH  Result Value Ref  Range   TSH 2.12 0.35 - 4.50 uIU/mL  Comprehensive metabolic panel  Result Value Ref Range   Sodium 141 135 - 145 mEq/L   Potassium 4.1 3.5 - 5.1 mEq/L   Chloride 105 96 - 112 mEq/L   CO2 27 19 - 32 mEq/L   Glucose, Bld 80 70 -  99 mg/dL   BUN 17 6 - 23 mg/dL   Creatinine, Ser 0.84 0.40 - 1.20 mg/dL   Total Bilirubin 0.6 0.2 - 1.2 mg/dL   Alkaline Phosphatase 41 39 - 117 U/L   AST 20 0 - 37 U/L   ALT 17 0 - 35 U/L   Total Protein 7.3 6.0 - 8.3 g/dL   Albumin 4.2 3.5 - 5.2 g/dL   Calcium 9.4 8.4 - 10.5 mg/dL   GFR 65.43 >60.00 mL/min  Lipid panel  Result Value Ref Range   Cholesterol 227 (H) 0 - 200 mg/dL   Triglycerides 133.0 0.0 - 149.0 mg/dL   HDL 47.60 >39.00 mg/dL   VLDL 26.6 0.0 - 40.0 mg/dL   LDL Cholesterol 153 (H) 0 - 99 mg/dL   Total CHOL/HDL Ratio 5    NonHDL 179.58    Assessment & Plan:   Problem List Items Addressed This Visit    Osteopenia    Latest dexa with improvement in bone density. Continue calcium in diet, vit D supplement and regular weight bearing exercises.       Medicare annual wellness visit, subsequent - Primary    I have personally reviewed the Medicare Annual Wellness questionnaire and have noted 1. The patient's medical and social history 2. Their use of alcohol, tobacco or illicit drugs 3. Their current medications and supplements 4. The patient's functional ability including ADL's, fall risks, home safety risks and hearing or visual impairment. Cognitive function has been assessed and addressed as indicated.  5. Diet and physical activity 6. Evidence for depression or mood disorders The patients weight, height, BMI have been recorded in the chart. I have made referrals, counseling and provided education to the patient based on review of the above and I have provided the pt with a written personalized care plan for preventive services. Provider list updated.. See scanned questionairre as needed for further documentation. Reviewed  preventative protocols and updated unless pt declined.       Hypothyroidism    Chronic, stable. Continue current regimen.       Relevant Medications   levothyroxine (SYNTHROID) 75 MCG tablet   Dyslipidemia    Chronic, off meds. The 10-year ASCVD risk score Mikey Bussing DC Brooke Bonito., et al., 2013) is: 28.6%   Values used to calculate the score:     Age: 66 years     Sex: Female     Is Non-Hispanic African American: No     Diabetic: No     Tobacco smoker: No     Systolic Blood Pressure: 0000000 mmHg     Is BP treated: No     HDL Cholesterol: 47.6 mg/dL     Total Cholesterol: 227 mg/dL       Clavicle enlargement    R sided, just noted over last few weeks. Check xray for further evaluation.       Relevant Orders   DG Clavicle Right   BREAST CANCER, HX OF    Regularly sees onc.       Advanced care planning/counseling discussion    Advanced directive discussion -does not have set up but has talked to children to let he wishes known. HCPOA would be 2 children. Packet provided today.        Other Visit Diagnoses    Need for influenza vaccination       Relevant Orders   Flu Vaccine QUAD High Dose(Fluad) (Completed)       Meds ordered this encounter  Medications  . levothyroxine (  SYNTHROID) 75 MCG tablet    Sig: TAKE 1 TABLET BY MOUTH DAILY AND 1/2 TABLET ONE DAY A WEEK    Dispense:  90 tablet    Refill:  3   Orders Placed This Encounter  Procedures  . DG Clavicle Right    Standing Status:   Future    Number of Occurrences:   1    Standing Expiration Date:   10/03/2020    Order Specific Question:   Reason for Exam (SYMPTOM  OR DIAGNOSIS REQUIRED)    Answer:   R clavicle enlargement x 4 wks    Order Specific Question:   Preferred imaging location?    Answer:   [facility]    Order Specific Question:   Radiology Contrast Protocol - do NOT remove file path    Answer:   [email or web address]  . Flu Vaccine QUAD High Dose(Fluad)     Patient instructions: Flu shot today We will sign you up for cologuard.  If interested, check with pharmacy about new 2 shot shingles series (shingrix).  Advanced directive packet provided today.  You have an enlarged clavicle on the right. We will check xray today for further evaluation. Let me know if enlarging  Follow up plan: Return in about 1 year (around 08/03/2020) for medicare wellness visit.  Ria Bush, MD

## 2019-08-04 NOTE — Assessment & Plan Note (Addendum)
Advanced directive discussion -does not have set up but has talked to children to let he wishes known. HCPOA would be 2 children. Packet provided today.

## 2019-08-04 NOTE — Assessment & Plan Note (Signed)

## 2019-08-04 NOTE — Assessment & Plan Note (Signed)
Chronic, stable. Continue current regimen. 

## 2019-08-04 NOTE — Assessment & Plan Note (Signed)
Chronic, off meds. The 10-year ASCVD risk score Mikey Bussing DC Brooke Bonito., et al., 2013) is: 28.6%   Values used to calculate the score:     Age: 78 years     Sex: Female     Is Non-Hispanic African American: No     Diabetic: No     Tobacco smoker: No     Systolic Blood Pressure: 0000000 mmHg     Is BP treated: No     HDL Cholesterol: 47.6 mg/dL     Total Cholesterol: 227 mg/dL

## 2019-08-04 NOTE — Assessment & Plan Note (Signed)
Regularly sees onc. 

## 2019-08-04 NOTE — Assessment & Plan Note (Signed)
R sided, just noted over last few weeks. Check xray for further evaluation.

## 2019-08-24 DIAGNOSIS — Z1212 Encounter for screening for malignant neoplasm of rectum: Secondary | ICD-10-CM | POA: Diagnosis not present

## 2019-08-24 DIAGNOSIS — Z1211 Encounter for screening for malignant neoplasm of colon: Secondary | ICD-10-CM | POA: Diagnosis not present

## 2019-08-29 LAB — COLOGUARD: Cologuard: NEGATIVE

## 2019-08-30 ENCOUNTER — Encounter: Payer: Self-pay | Admitting: Family Medicine

## 2019-08-30 ENCOUNTER — Telehealth: Payer: Self-pay

## 2019-08-30 NOTE — Telephone Encounter (Signed)
Spoke with pt relaying that Cologuard result is negative.  Also, she can repeat in 3 yrs, per Dr. Darnell Level.  Pt verbalizes understanding.

## 2019-09-07 ENCOUNTER — Other Ambulatory Visit: Payer: Self-pay

## 2019-09-07 ENCOUNTER — Inpatient Hospital Stay: Payer: Medicare Other | Attending: Hematology & Oncology | Admitting: Hematology & Oncology

## 2019-09-07 ENCOUNTER — Inpatient Hospital Stay: Payer: Medicare Other

## 2019-09-07 VITALS — BP 175/69 | HR 82 | Temp 98.2°F | Wt 145.5 lb

## 2019-09-07 DIAGNOSIS — Z171 Estrogen receptor negative status [ER-]: Secondary | ICD-10-CM | POA: Diagnosis not present

## 2019-09-07 DIAGNOSIS — E039 Hypothyroidism, unspecified: Secondary | ICD-10-CM | POA: Diagnosis not present

## 2019-09-07 DIAGNOSIS — Z853 Personal history of malignant neoplasm of breast: Secondary | ICD-10-CM

## 2019-09-07 DIAGNOSIS — E02 Subclinical iodine-deficiency hypothyroidism: Secondary | ICD-10-CM | POA: Diagnosis not present

## 2019-09-07 DIAGNOSIS — M85811 Other specified disorders of bone density and structure, right shoulder: Secondary | ICD-10-CM | POA: Diagnosis not present

## 2019-09-07 DIAGNOSIS — Z79899 Other long term (current) drug therapy: Secondary | ICD-10-CM | POA: Insufficient documentation

## 2019-09-07 DIAGNOSIS — C50912 Malignant neoplasm of unspecified site of left female breast: Secondary | ICD-10-CM | POA: Insufficient documentation

## 2019-09-07 LAB — CMP (CANCER CENTER ONLY)
ALT: 17 U/L (ref 0–44)
AST: 20 U/L (ref 15–41)
Albumin: 4.5 g/dL (ref 3.5–5.0)
Alkaline Phosphatase: 45 U/L (ref 38–126)
Anion gap: 7 (ref 5–15)
BUN: 15 mg/dL (ref 8–23)
CO2: 29 mmol/L (ref 22–32)
Calcium: 10.3 mg/dL (ref 8.9–10.3)
Chloride: 105 mmol/L (ref 98–111)
Creatinine: 0.81 mg/dL (ref 0.44–1.00)
GFR, Est AFR Am: >60 mL/min (ref >60)
GFR, Estimated: >60 mL/min (ref >60)
Glucose, Bld: 103 mg/dL — ABNORMAL HIGH (ref 70–99)
Potassium: 4.1 mmol/L (ref 3.5–5.1)
Sodium: 141 mmol/L (ref 135–145)
Total Bilirubin: 0.7 mg/dL (ref 0.3–1.2)
Total Protein: 7.5 g/dL (ref 6.5–8.1)

## 2019-09-07 LAB — CBC WITH DIFFERENTIAL (CANCER CENTER ONLY)
Abs Immature Granulocytes: 0.05 10*3/uL (ref 0.00–0.07)
Basophils Absolute: 0.1 10*3/uL (ref 0.0–0.1)
Basophils Relative: 1 %
Eosinophils Absolute: 0.3 10*3/uL (ref 0.0–0.5)
Eosinophils Relative: 5 %
HCT: 46 % (ref 36.0–46.0)
Hemoglobin: 14.9 g/dL (ref 12.0–15.0)
Immature Granulocytes: 1 %
Lymphocytes Relative: 28 %
Lymphs Abs: 1.9 10*3/uL (ref 0.7–4.0)
MCH: 29.8 pg (ref 26.0–34.0)
MCHC: 32.4 g/dL (ref 30.0–36.0)
MCV: 92 fL (ref 80.0–100.0)
Monocytes Absolute: 1 10*3/uL (ref 0.1–1.0)
Monocytes Relative: 14 %
Neutro Abs: 3.5 10*3/uL (ref 1.7–7.7)
Neutrophils Relative %: 51 %
Platelet Count: 225 10*3/uL (ref 150–400)
RBC: 5 MIL/uL (ref 3.87–5.11)
RDW: 13.3 % (ref 11.5–15.5)
WBC Count: 6.9 10*3/uL (ref 4.0–10.5)
nRBC: 0 % (ref 0.0–0.2)

## 2019-09-07 NOTE — Progress Notes (Signed)
Hematology and Oncology Follow Up Visit  Crissie Leos [id number] 1941-09-15 78 y.o. 09/07/2019   Principle Diagnosis:  1. 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   Current Therapy:   Observation    Interim History: Ms. Cullinan is here today for her annual visit.  So far, everything is going quite well.  We see her once a year.  Her family has been doing pretty well.  Unfortunately, her grandson had an accident last night.  His truck got totaled.  Thankfully, he is okay.  She is now in the furniture refurbishing.  She really enjoys this.  She thinks she might try to make it this is this.   So far, for hypothyroidism is not been a problem.  I think that we are following this along.  She probably due for a mammogram in December.   Her appetite has been good.  She is doing a lot of cooking.  She is getting ready for the holidays.    Her performance status is ECOG 0 Medications:  Allergies as of 09/07/2019      Reactions   Flonase  [fluticasone Propionate] Anaphylaxis   Fluticasone Propionate    REACTION: throat tight      Medication List       Accurate as of September 07, 2019 11:39 AM. If you have any questions, ask your nurse or doctor.        latanoprost 0.005 % ophthalmic solution Commonly known as: XALATAN Place into both eyes at bedtime.   levothyroxine 75 MCG tablet Commonly known as: SYNTHROID TAKE 1 TABLET BY MOUTH DAILY AND 1/2 TABLET ONE DAY A WEEK   multivitamin tablet Take 1 tablet by mouth daily.   Vitamin D3 25 MCG (1000 UT) Caps Take 1 capsule (1,000 Units total) by mouth daily.       Allergies:  Allergies  Allergen Reactions  . Flonase  [Fluticasone Propionate] Anaphylaxis  . Fluticasone Propionate     REACTION: throat tight    Past Medical History, Surgical history, Social history, and Family History were reviewed and updated.  Review of Systems: Review of Systems  Constitutional: Negative.   HENT: Negative.    Eyes: Negative.   Respiratory: Negative.   Cardiovascular: Negative.  Negative for chest pain.  Gastrointestinal: Negative.   Genitourinary: Negative.   Musculoskeletal: Negative.   Skin: Negative.   Neurological: Negative.   Endo/Heme/Allergies: Negative.   Psychiatric/Behavioral: Negative.      Physical Exam:  weight is 145 lb 8 oz (66 kg). Her tympanic temperature is 98.2 F (36.8 C). Her blood pressure is 175/69 (abnormal) and her pulse is 82. Her oxygen saturation is 97%.   Wt Readings from Last 3 Encounters:  09/07/19 145 lb 8 oz (66 kg)  08/04/19 143 lb 7 oz (65.1 kg)  09/08/18 142 lb (64.4 kg)    Well-developed and well-nourished white female in no obvious distress. Head and neck exam shows no ocular or oral lesions. There are no palpable cervical or supraclavicular lymph nodes. Lungs are clear bilaterally. Cardiac exam regular rate and rhythm with no murmurs, rubs or bruits. Breast exam shows right breast with no masses, edema or erythema. There is no right axillary adenopathy. Left breast shows well-healed lumpectomy at the 11:00 position. She has some slight firmness of the lumpectomy site. There is no distinct mass in the left breast. There is no left axillary adenopathy. Abdomen is soft. She has good bowel sounds. There is no fluid wave. There  is no palpable liver or spleen tip. Extremities shows no clubbing, cyanosis or edema. She has good range of motion of her joints. There is no lymphedema in her left arm. Skin exam shows no rashes, ecchymoses or petechia. Neurological exam shows no focal neurological deficits.    Lab Results  Component Value Date   WBC 6.9 09/07/2019   HGB 14.9 09/07/2019   HCT 46.0 09/07/2019   MCV 92.0 09/07/2019   PLT 225 09/07/2019   No results found for: FERRITIN, IRON, TIBC, UIBC, IRONPCTSAT Lab Results  Component Value Date   RBC 5.00 09/07/2019   No results found for: KPAFRELGTCHN, LAMBDASER, KAPLAMBRATIO No results found for:  IGGSERUM, IGA, IGMSERUM No results found for: Odetta Pink, SPEI   Chemistry      Component Value Date/Time   NA 141 09/07/2019 0957   NA 142 09/08/2017 1028   K 4.1 09/07/2019 0957   K 4.5 09/08/2017 1028   CL 105 09/07/2019 0957   CL 103 09/14/2014 1111   CO2 29 09/07/2019 0957   CO2 26 09/08/2017 1028   BUN 15 09/07/2019 0957   BUN 18.9 09/08/2017 1028   CREATININE 0.81 09/07/2019 0957   CREATININE 1.2 (H) 09/08/2017 1028      Component Value Date/Time   CALCIUM 10.3 09/07/2019 0957   CALCIUM 9.8 09/08/2017 1028   ALKPHOS 45 09/07/2019 0957   ALKPHOS 57 09/08/2017 1028   AST 20 09/07/2019 0957   AST 21 09/08/2017 1028   ALT 17 09/07/2019 0957   ALT 15 09/08/2017 1028   BILITOT 0.7 09/07/2019 0957   BILITOT 0.57 09/08/2017 1028     Impression and Plan: Ms. 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 Her tumor was ER negative. She is now going on 21 years out and has done quite well.   From my point of view, he everything looks fantastic. I do not see any obvious recurrence. However, she is at risk for recurrence although I think the risk is probably less than 5%.  She does like to see [place]. She does not mind seeing [place] yearly. She feels that we give her peace of mind as we give her a thorough examinations.   Volanda Napoleon, MD 10/8/202011:39 AM

## 2019-10-09 DIAGNOSIS — Z1231 Encounter for screening mammogram for malignant neoplasm of breast: Secondary | ICD-10-CM | POA: Diagnosis not present

## 2019-10-09 DIAGNOSIS — Z853 Personal history of malignant neoplasm of breast: Secondary | ICD-10-CM | POA: Diagnosis not present

## 2019-10-09 LAB — HM MAMMOGRAPHY

## 2019-10-10 DIAGNOSIS — H00022 Hordeolum internum right lower eyelid: Secondary | ICD-10-CM | POA: Diagnosis not present

## 2019-10-10 DIAGNOSIS — H401132 Primary open-angle glaucoma, bilateral, moderate stage: Secondary | ICD-10-CM | POA: Diagnosis not present

## 2019-10-16 ENCOUNTER — Encounter: Payer: Self-pay | Admitting: Family Medicine

## 2019-12-22 ENCOUNTER — Ambulatory Visit: Payer: Medicare Other | Attending: Internal Medicine

## 2019-12-22 DIAGNOSIS — Z23 Encounter for immunization: Secondary | ICD-10-CM | POA: Insufficient documentation

## 2019-12-22 NOTE — Progress Notes (Signed)
   Covid-19 Vaccination Clinic  Name:  Joanna White    MRN: [id number] DOB: 05/07/1941  12/22/2019  Joanna White was observed post Covid-19 immunization for 15 minutes without incidence. She was provided with Vaccine Information Sheet and instruction to access the V-Safe system.   Joanna White was instructed to call 911 with any severe reactions post vaccine: Marland Kitchen Difficulty breathing  . Swelling of your face and throat  . A fast heartbeat  . A bad rash all over your body  . Dizziness and weakness    Immunizations Administered    Name Date Dose VIS Date Route   Pfizer COVID-19 Vaccine 12/22/2019 12:40 PM 0.3 mL 11/10/2019 Intramuscular   Manufacturer: [place]   Lot: [id number]   [place]: [id number]

## 2020-01-07 ENCOUNTER — Encounter: Payer: Self-pay | Admitting: Family Medicine

## 2020-01-07 DIAGNOSIS — H4010X0 Unspecified open-angle glaucoma, stage unspecified: Secondary | ICD-10-CM | POA: Insufficient documentation

## 2020-01-12 ENCOUNTER — Ambulatory Visit: Payer: Medicare Other | Attending: Internal Medicine

## 2020-01-12 DIAGNOSIS — Z23 Encounter for immunization: Secondary | ICD-10-CM | POA: Insufficient documentation

## 2020-01-12 NOTE — Progress Notes (Signed)
   Covid-19 Vaccination Clinic  Name:  Joanna White    MRN: [id number] DOB: [birth year]  01/12/2020  Joanna White was observed post Covid-19 immunization for 15 minutes without incidence. She was provided with Vaccine Information Sheet and instruction to access the V-Safe system.   Joanna White was instructed to call 911 with any severe reactions post vaccine: Marland Kitchen Difficulty breathing  . Swelling of your face and throat  . A fast heartbeat  . A bad rash all over your body  . Dizziness and weakness    Immunizations Administered    Name Date Dose VIS Date Route   Pfizer COVID-19 Vaccine 01/12/2020 12:18 PM 0.3 mL 11/10/2019 Intramuscular   Manufacturer: [place]   Lot: [id number]   [place]: [id number]

## 2020-01-16 DIAGNOSIS — H401132 Primary open-angle glaucoma, bilateral, moderate stage: Secondary | ICD-10-CM | POA: Diagnosis not present

## 2020-05-10 ENCOUNTER — Other Ambulatory Visit: Payer: Self-pay | Admitting: Family Medicine

## 2020-05-15 DIAGNOSIS — H401132 Primary open-angle glaucoma, bilateral, moderate stage: Secondary | ICD-10-CM | POA: Diagnosis not present

## 2020-08-10 ENCOUNTER — Other Ambulatory Visit: Payer: Self-pay | Admitting: Family Medicine

## 2020-08-19 DIAGNOSIS — H401132 Primary open-angle glaucoma, bilateral, moderate stage: Secondary | ICD-10-CM | POA: Diagnosis not present

## 2020-08-19 DIAGNOSIS — Z961 Presence of intraocular lens: Secondary | ICD-10-CM | POA: Diagnosis not present

## 2020-08-29 DIAGNOSIS — Z23 Encounter for immunization: Secondary | ICD-10-CM | POA: Diagnosis not present

## 2020-09-03 ENCOUNTER — Telehealth: Payer: Self-pay | Admitting: Hematology & Oncology

## 2020-09-03 NOTE — Telephone Encounter (Signed)
Patient called to request that her 10/8 appt be moved out to the following month.  Appointments moved as requested

## 2020-09-06 ENCOUNTER — Inpatient Hospital Stay: Payer: Medicare Other

## 2020-09-06 ENCOUNTER — Inpatient Hospital Stay: Payer: Medicare Other | Admitting: Hematology & Oncology

## 2020-10-21 ENCOUNTER — Other Ambulatory Visit: Payer: Self-pay | Admitting: *Deleted

## 2020-10-21 DIAGNOSIS — Z853 Personal history of malignant neoplasm of breast: Secondary | ICD-10-CM

## 2020-10-22 ENCOUNTER — Inpatient Hospital Stay (HOSPITAL_BASED_OUTPATIENT_CLINIC_OR_DEPARTMENT_OTHER): Payer: Medicare Other | Admitting: Hematology & Oncology

## 2020-10-22 ENCOUNTER — Inpatient Hospital Stay: Payer: Medicare Other | Attending: Hematology & Oncology

## 2020-10-22 ENCOUNTER — Telehealth: Payer: Self-pay

## 2020-10-22 ENCOUNTER — Encounter: Payer: Self-pay | Admitting: Hematology & Oncology

## 2020-10-22 ENCOUNTER — Other Ambulatory Visit: Payer: Self-pay

## 2020-10-22 VITALS — BP 143/61 | HR 80 | Temp 98.4°F | Resp 16 | Wt 142.0 lb

## 2020-10-22 DIAGNOSIS — Z853 Personal history of malignant neoplasm of breast: Secondary | ICD-10-CM | POA: Insufficient documentation

## 2020-10-22 DIAGNOSIS — E038 Other specified hypothyroidism: Secondary | ICD-10-CM | POA: Diagnosis not present

## 2020-10-22 LAB — COMPREHENSIVE METABOLIC PANEL
ALT: 14 U/L (ref 0–44)
AST: 18 U/L (ref 15–41)
Albumin: 4.3 g/dL (ref 3.5–5.0)
Alkaline Phosphatase: 47 U/L (ref 38–126)
Anion gap: 7 (ref 5–15)
BUN: 16 mg/dL (ref 8–23)
CO2: 30 mmol/L (ref 22–32)
Calcium: 9.9 mg/dL (ref 8.9–10.3)
Chloride: 104 mmol/L (ref 98–111)
Creatinine, Ser: 0.93 mg/dL (ref 0.44–1.00)
GFR, Estimated: >60 mL/min (ref >60)
Glucose, Bld: 111 mg/dL — ABNORMAL HIGH (ref 70–99)
Potassium: 4.1 mmol/L (ref 3.5–5.1)
Sodium: 141 mmol/L (ref 135–145)
Total Bilirubin: 0.6 mg/dL (ref 0.3–1.2)
Total Protein: 7.5 g/dL (ref 6.5–8.1)

## 2020-10-22 LAB — CBC WITH DIFFERENTIAL (CANCER CENTER ONLY)
Abs Immature Granulocytes: 0.05 10*3/uL (ref 0.00–0.07)
Basophils Absolute: 0.1 10*3/uL (ref 0.0–0.1)
Basophils Relative: 1 %
Eosinophils Absolute: 0.4 10*3/uL (ref 0.0–0.5)
Eosinophils Relative: 5 %
HCT: 46.8 % — ABNORMAL HIGH (ref 36.0–46.0)
Hemoglobin: 15.3 g/dL — ABNORMAL HIGH (ref 12.0–15.0)
Immature Granulocytes: 1 %
Lymphocytes Relative: 33 %
Lymphs Abs: 2.3 10*3/uL (ref 0.7–4.0)
MCH: 30.1 pg (ref 26.0–34.0)
MCHC: 32.7 g/dL (ref 30.0–36.0)
MCV: 92.1 fL (ref 80.0–100.0)
Monocytes Absolute: 0.9 10*3/uL (ref 0.1–1.0)
Monocytes Relative: 12 %
Neutro Abs: 3.5 10*3/uL (ref 1.7–7.7)
Neutrophils Relative %: 48 %
Platelet Count: 230 10*3/uL (ref 150–400)
RBC: 5.08 MIL/uL (ref 3.87–5.11)
RDW: 13.1 % (ref 11.5–15.5)
WBC Count: 7.1 10*3/uL (ref 4.0–10.5)
nRBC: 0 % (ref 0.0–0.2)

## 2020-10-22 NOTE — Progress Notes (Signed)
Hematology and Oncology Follow Up Visit  Joanna White [id number] 03-23-1941 79 y.o. 10/22/2020   Principle Diagnosis:  1. 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   Current Therapy:   Observation    Interim History: [name] is here today for her annual visit.  As always, she is quite independent.  She is doing her Engineer, production.  She really enjoys this.  It sounds like she is quite busy.  She and her family had a good year.  [place].  She had a good time up there.  She has had no problems with the coronavirus.  She has had her coronavirus vaccines and the booster.  She has had no issues with fever.  She has had no cough or shortness of breath.  She did have a Cologuard test a year ago she said.  She has had no bleeding.  There is been no leg swelling.  She has had no rashes.  Overall, I would say her performance status is ECOG 1.    Medications:  Allergies as of 10/22/2020      Reactions   Flonase  [fluticasone Propionate] Anaphylaxis   Fluticasone Propionate    REACTION: throat tight      Medication List       Accurate as of October 22, 2020  9:06 AM. If you have any questions, ask your nurse or doctor.        latanoprost 0.005 % ophthalmic solution Commonly known as: XALATAN Place into both eyes at bedtime.   levothyroxine 75 MCG tablet Commonly known as: SYNTHROID TAKE 1 TABLET DAILY& ONE-HALF TABLET 1 DAY A WEEK   multivitamin tablet Take 1 tablet by mouth daily.   Vitamin D3 25 MCG (1000 UT) Caps Take 1 capsule (1,000 Units total) by mouth daily.       Allergies:  Allergies  Allergen Reactions  . Flonase  [Fluticasone Propionate] Anaphylaxis  . Fluticasone Propionate     REACTION: throat tight    Past Medical History, Surgical history, Social history, and Family History were reviewed and updated.  Review of Systems: Review of Systems  Constitutional: Negative.    HENT: Negative.   Eyes: Negative.   Respiratory: Negative.   Cardiovascular: Negative.  Negative for chest pain.  Gastrointestinal: Negative.   Genitourinary: Negative.   Musculoskeletal: Negative.   Skin: Negative.   Neurological: Negative.   Endo/Heme/Allergies: Negative.   Psychiatric/Behavioral: Negative.      Physical Exam:  vitals were not taken for this visit.   Wt Readings from Last 3 Encounters:  09/07/19 145 lb 8 oz (66 kg)  08/04/19 143 lb 7 oz (65.1 kg)  09/08/18 142 lb (64.4 kg)  Her vital signs show temperature of 98.4.  Pulse 80.  Blood pressure 143/61.  Weight is 142 pounds.  Physical Exam Vitals reviewed.  HENT:     Head: Normocephalic and atraumatic.  Eyes:     Pupils: Pupils are equal, round, and reactive to light.  Cardiovascular:     Rate and Rhythm: Normal rate and regular rhythm.     Heart sounds: Normal heart sounds.  Pulmonary:     Effort: Pulmonary effort is normal.     Breath sounds: Normal breath sounds.  Abdominal:     General: Bowel sounds are normal.     Palpations: Abdomen is soft.  Musculoskeletal:        General: No tenderness or deformity. Normal range of  motion.     Cervical back: Normal range of motion.  Lymphadenopathy:     Cervical: No cervical adenopathy.  Skin:    General: Skin is warm and dry.     Findings: No erythema or rash.  Neurological:     Mental Status: She is alert and oriented to person, place, and time.  Psychiatric:        Behavior: Behavior normal.        Thought Content: Thought content normal.        Judgment: Judgment normal.      Lab Results  Component Value Date   WBC 7.1 10/22/2020   HGB 15.3 (H) 10/22/2020   HCT 46.8 (H) 10/22/2020   MCV 92.1 10/22/2020   PLT 230 10/22/2020   No results found for: FERRITIN, IRON, TIBC, UIBC, IRONPCTSAT Lab Results  Component Value Date   RBC 5.08 10/22/2020   No results found for: KPAFRELGTCHN, LAMBDASER, KAPLAMBRATIO No results found for: IGGSERUM,  IGA, IGMSERUM No results found for: Odetta Pink, SPEI   Chemistry      Component Value Date/Time   NA 141 09/07/2019 0957   NA 142 09/08/2017 1028   K 4.1 09/07/2019 0957   K 4.5 09/08/2017 1028   CL 105 09/07/2019 0957   CL 103 09/14/2014 1111   CO2 29 09/07/2019 0957   CO2 26 09/08/2017 1028   BUN 15 09/07/2019 0957   BUN 18.9 09/08/2017 1028   CREATININE 0.81 09/07/2019 0957   CREATININE 1.2 (H) 09/08/2017 1028      Component Value Date/Time   CALCIUM 10.3 09/07/2019 0957   CALCIUM 9.8 09/08/2017 1028   ALKPHOS 45 09/07/2019 0957   ALKPHOS 57 09/08/2017 1028   AST 20 09/07/2019 0957   AST 21 09/08/2017 1028   ALT 17 09/07/2019 0957   ALT 15 09/08/2017 1028   BILITOT 0.7 09/07/2019 0957   BILITOT 0.57 09/08/2017 1028     Impression and Plan: [name] is a very pleasant 79 yo white female with history of stage IIA ductal carcinoma of the left breast treated with adjuvant chemotherapy with CM[facility]. Her tumor was ER negative. She is now going on 22 years out and has done quite well.   From my point of view,  everything looks fantastic. I do not see any obvious recurrence. However, she is at risk for recurrence although I think the risk is probably less than 5%.  She does like to see [place]. She does not mind seeing [place] yearly. She feels that we give her peace of mind as we give her a thorough examinations.   Volanda Napoleon, MD 11/23/20219:06 AM

## 2020-10-22 NOTE — Telephone Encounter (Signed)
appts made and printed fort pt    AOM

## 2020-10-30 ENCOUNTER — Encounter: Payer: Self-pay | Admitting: Family Medicine

## 2020-10-30 DIAGNOSIS — Z1231 Encounter for screening mammogram for malignant neoplasm of breast: Secondary | ICD-10-CM | POA: Diagnosis not present

## 2020-11-02 ENCOUNTER — Telehealth: Payer: Self-pay | Admitting: Family Medicine

## 2020-11-02 NOTE — Telephone Encounter (Signed)
plz notify I received results of DEXA scan from 10/30/2020 - showing persistent osteopenia, a bit worse at left hip. rec continue vit D replacement, continue good calcium in diet and regular weight bearing exercises.   I also received notice of abnormal mammo from [place] - recommendation is to return for more detailed mammogram and possible L breast US. Please let us know if she hasn't been contacted to get this scheduled by [facility].

## 2020-11-04 DIAGNOSIS — R928 Other abnormal and inconclusive findings on diagnostic imaging of breast: Secondary | ICD-10-CM | POA: Diagnosis not present

## 2020-11-04 DIAGNOSIS — R922 Inconclusive mammogram: Secondary | ICD-10-CM | POA: Diagnosis not present

## 2020-11-04 DIAGNOSIS — R921 Mammographic calcification found on diagnostic imaging of breast: Secondary | ICD-10-CM | POA: Diagnosis not present

## 2020-11-04 LAB — HM MAMMOGRAPHY

## 2020-11-05 NOTE — Telephone Encounter (Signed)
Noted thank you

## 2020-11-05 NOTE — Telephone Encounter (Signed)
Spoke with pt relaying Dr. Synthia Innocent message.  Pt verbalizes understanding.  States she had f/u mammogram yesterday and was told everything was ok.

## 2020-11-11 ENCOUNTER — Encounter: Payer: Self-pay | Admitting: Family Medicine

## 2020-11-11 ENCOUNTER — Other Ambulatory Visit: Payer: Self-pay | Admitting: Family Medicine

## 2020-11-11 NOTE — Telephone Encounter (Signed)
Last OV 08/04/19 Last refill 08/03/20 #90/0 Next OV not scheduled  >1 year since last visit, forwarding to PCP.

## 2020-11-12 DIAGNOSIS — Z23 Encounter for immunization: Secondary | ICD-10-CM | POA: Diagnosis not present

## 2020-11-12 NOTE — Telephone Encounter (Signed)
plz schedule OV/CPE

## 2020-12-05 DIAGNOSIS — H401132 Primary open-angle glaucoma, bilateral, moderate stage: Secondary | ICD-10-CM | POA: Diagnosis not present

## 2021-01-05 ENCOUNTER — Other Ambulatory Visit: Payer: Self-pay | Admitting: Family Medicine

## 2021-01-05 DIAGNOSIS — D751 Secondary polycythemia: Secondary | ICD-10-CM

## 2021-01-05 DIAGNOSIS — E02 Subclinical iodine-deficiency hypothyroidism: Secondary | ICD-10-CM

## 2021-01-05 DIAGNOSIS — E785 Hyperlipidemia, unspecified: Secondary | ICD-10-CM

## 2021-01-05 NOTE — Addendum Note (Signed)
Addended by: Ria Bush on: 01/05/2021 10:19 PM   Modules accepted: Orders

## 2021-01-07 ENCOUNTER — Ambulatory Visit (INDEPENDENT_AMBULATORY_CARE_PROVIDER_SITE_OTHER): Payer: Medicare HMO

## 2021-01-07 ENCOUNTER — Other Ambulatory Visit: Payer: Self-pay

## 2021-01-07 DIAGNOSIS — Z0000 Encounter for general adult medical examination without abnormal findings: Secondary | ICD-10-CM

## 2021-01-07 NOTE — Progress Notes (Signed)
Subjective:   Joanna White is a 80 y.o. female who presents for Medicare Annual (Subsequent) preventive examination.  Review of Systems: N/A      I connected with the patient today by telephone and verified that I am speaking with the correct person using two identifiers. Location patient: home Location nurse: work Persons participating in the telephone visit: patient, nurse.   I discussed the limitations, risks, security and privacy concerns of performing an evaluation and management service by telephone and the availability of in person appointments. I also discussed with the patient that there may be a patient responsible charge related to this service. The patient expressed understanding and verbally consented to this telephonic visit.        Cardiac Risk Factors include: advanced age (>57men, >72 women);Other (see comment), Risk factor comments: hyperlipidemia     Objective:    Today's Vitals   There is no height or weight on file to calculate BMI.  Advanced Directives 01/07/2021 10/22/2020 09/08/2018 03/02/2018 09/08/2017 03/01/2017 09/11/2016  Does Patient Have a Medical Advance Directive? Yes No No No Yes Yes No  Type of Paramedic of [place];Living will - - - Living will [place];Living will -  Does patient want to make changes to medical advance directive? - - - No - Patient declined - - -  Copy of [place] in Chart? No - copy requested - - - - No - copy requested -  Would patient like information on creating a medical advance directive? - No - Patient declined - No - Patient declined - - No - patient declined information    Current Medications (verified) Outpatient Encounter Medications as of 01/07/2021  Medication Sig  . Cholecalciferol (VITAMIN D3) 1000 units CAPS Take 1 capsule (1,000 Units total) by mouth daily.  Marland Kitchen latanoprost (XALATAN) 0.005 % ophthalmic solution Place into both eyes at bedtime.    Marland Kitchen levothyroxine (SYNTHROID) 75 MCG tablet TAKE 1 TABLET DAILY& ONE-HALF TABLET 1 DAY A WEEK  . Multiple Vitamin (MULTIVITAMIN) tablet Take 1 tablet by mouth daily.   No facility-administered encounter medications on file as of 01/07/2021.    Allergies (verified) Flonase  [fluticasone propionate] and Fluticasone propionate   History: Past Medical History:  Diagnosis Date  . Glaucoma   . Hypothyroid   . Osteopenia 08/2011   h/o osteoporosis, s/p boniva for 5 yrs  . Personal history of malignant neoplasm of breast 1998   Left   Past Surgical History:  Procedure Laterality Date  . BREAST LUMPECTOMY  1998   Left  . CESAREAN SECTION     x 2  . DEXA  10/2015   osteopenia, T -1.1 spine, hip -1.7   . GYN surgery     uterine lining removed for heavy bleeding and polyp  . lymph node removal  1998   left axilla  . Thumb surgery     bilateral  . TOTAL KNEE ARTHROPLASTY Left 2016   Norberto Sorenson)   Family History  Problem Relation Age of Onset  . Diabetes Brother        x2  . Cancer Father 60       lung  . Cancer Mother 68       MM  . Coronary artery disease Brother 30       CABG  . Diabetes Brother   . Diabetes Maternal Grandmother   . Stroke Neg Hx    Social History   Socioeconomic History  .  Marital status: Widowed    Spouse name: Not on file  . Number of children: 2  . Years of education: Not on file  . Highest education level: Not on file  Occupational History  . Occupation: Retired  Tobacco Use  . Smoking status: Never Smoker  . Smokeless tobacco: Never Used  Vaping Use  . Vaping Use: Never used  Substance and Sexual Activity  . Alcohol use: No    Alcohol/week: 0.0 standard drinks  . Drug use: No  . Sexual activity: Never  Other Topics Concern  . 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 Difficulty of Paying Living Expenses: Not hard at all  Food Insecurity: No Food Insecurity  . Worried About Charity fundraiser in the Last Year: Never true  . Ran Out of Food in the Last Year: Never true  Transportation Needs: No Transportation Needs  . Lack of Transportation (Medical): No  . Lack of Transportation (Non-Medical): No  Physical Activity: Inactive  . Days of Exercise per Week: 0 days  . Minutes of Exercise per Session: 0 min  Stress: No Stress Concern Present  . Feeling of Stress : Not at all  Social Connections: Not on file    Tobacco Counseling Counseling given: Not Answered   Clinical Intake:  Pre-visit preparation completed: Yes  Pain : No/denies pain     Nutritional Risks: None Diabetes: No  How often do you need to have someone help you when you read instructions, pamphlets, or other written materials from your doctor or pharmacy?: 1 - Never What is the last grade level you completed in school?: 12th, some college  Diabetic: No Nutrition Risk Assessment:  Has the patient had any N/V/D within the last 2 months?  No  Does the patient have any non-healing wounds?  No  Has the patient had any unintentional weight loss or weight gain?  No   Diabetes:  Is the patient diabetic?  No  If diabetic, was a CBG obtained today?  N/A Did the patient bring in their glucometer from home?  N/A How often do you monitor your CBG's? N/A.   Financial Strains and Diabetes Management:  Are you having any financial strains with the device, your supplies or your medication? N/A.  Does the patient want to be seen by Chronic Care Management for management of their diabetes?  N/A Would the patient like to be referred to a Nutritionist or for Diabetic Management?  N/A  Interpreter Needed?: No  Information entered by :: CJohnson, LPN   Activities of Daily Living In  your present state of health, do you have any difficulty performing the following activities: 01/07/2021  Hearing? N  Vision? N  Difficulty concentrating or making decisions? N  Walking or climbing stairs? N  Dressing or bathing? N  Doing errands, shopping? N  Preparing Food and eating ? N  Using the Toilet? N  In the past six months, have you accidently leaked urine? N  Do you have problems with loss of bowel control? N  Managing your Medications? N  Managing your Finances? N  Housekeeping or managing your Housekeeping? N  Some recent data might be hidden  Patient Care Team: Ria Bush, MD as PCP - General Rutherford Guys, MD as Consulting Physician (Ophthalmology)  Indicate any recent Medical Services you may have received from other than [facility] providers in the past year (date may be approximate).     Assessment:   This is a routine wellness examination for [facility].  Hearing/Vision screen  Hearing Screening   125Hz  250Hz  500Hz  1000Hz  2000Hz  3000Hz  4000Hz  6000Hz  8000Hz   Right ear:           Left ear:           Vision Screening Comments: Patient get annual eye exams   Dietary issues and exercise activities discussed: Current Exercise Habits: The patient does not participate in regular exercise at present, Exercise limited by: None identified  Goals    . DIET - INCREASE WATER INTAKE     Starting 03/02/2018, I will continue to drink at least 6-8 glasses of water daily.     . Increase physical activity     Starting 03/01/17, I will continue to exercise at least 30 min daily.     . Patient Stated     01/07/2021, I will maintain and continue medications as prescribed.       Depression Screen PHQ 2/9 Scores 01/07/2021 08/04/2019 03/02/2018 03/01/2017 02/28/2016  PHQ - 2 Score 0 0 0 0 0  PHQ- 9 Score 0 - 0 - -    Fall Risk Fall Risk  01/07/2021 08/04/2019 03/02/2018 03/01/2017 09/11/2016  Falls in the past year? 0 0 No No No  Number falls in past yr: 0 - - - -  Injury with Fall? 0 - -  - -  Risk for fall due to : Medication side effect;No Fall Risks - - - -  Follow up Falls evaluation completed;Falls prevention discussed - - - -    FALL RISK PREVENTION PERTAINING TO THE HOME:  Any stairs in or around the home? Yes  If so, are there any without handrails? No  Home free of loose throw rugs in walkways, pet beds, electrical cords, etc? Yes  Adequate lighting in your home to reduce risk of falls? Yes   ASSISTIVE DEVICES UTILIZED TO PREVENT FALLS:  Life alert? No  Use of a cane, walker or w/c? No  Grab bars in the bathroom? No  Shower chair or bench in shower? No  Elevated toilet seat or a handicapped toilet? No   TIMED UP AND GO:  Was the test performed? N/A telephone visit .   Cognitive Function: MMSE - Mini Mental State Exam 01/07/2021 03/02/2018 03/01/2017 02/28/2016  Orientation to time 5 5 5 5   Orientation to Place 5 5 5 5   Registration 3 3 3 3   Attention/ Calculation 5 0 0 0  Recall 3 3 3 3   Language- name 2 objects - 0 0 0  Language- repeat 1 1 1 1   Language- follow 3 step command - 3 3 3   Language- read & follow direction - 0 0 0  Write a sentence - 0 0 0  Copy design - 0 0 0  Total score - 20 20 20   Mini Cog  Mini-Cog screen was completed. Maximum score is 22. A value of 0 denotes this part of the MMSE was not completed or the patient failed this part of the Mini-Cog screening.       Immunizations Immunization History  Administered Date(s) Administered  . Fluad Quad(high Dose 65+) 08/04/2019  . Influenza Whole 11/02/2007, 11/02/2012  . Influenza,inj,Quad PF,6+ Mos  09/14/2013, 09/14/2014, 09/13/2015, 09/11/2016, 09/08/2017, 09/08/2018  . PFIZER(Purple Top)SARS-COV-2 Vaccination 12/22/2019, 01/12/2020, 09/29/2020  . Pneumococcal Conjugate-13 02/28/2016  . Pneumococcal Polysaccharide-23 09/14/2014  . Zoster 12/01/2015    TDAP status: Due, Education has been provided regarding the importance of this vaccine. Advised may receive this vaccine at  local pharmacy or Health Dept. Aware to provide a copy of the vaccination record if obtained from local pharmacy or Health Dept. Verbalized acceptance and understanding.  Flu Vaccine status: Up to date  Pneumococcal vaccine status: Up to date  Covid-19 vaccine status: Completed vaccines  Qualifies for Shingles Vaccine? Yes   Zostavax completed Yes   Shingrix Completed?: No.    Education has been provided regarding the importance of this vaccine. Patient has been advised to call insurance company to determine out of pocket expense if they have not yet received this vaccine. Advised may also receive vaccine at local pharmacy or Health Dept. Verbalized acceptance and understanding.  Screening Tests Health Maintenance  Topic Date Due  . TETANUS/TDAP  01/08/2024 (Originally 09/22/1960)  . MAMMOGRAM  11/04/2021  . INFLUENZA VACCINE  Completed  . DEXA SCAN  Completed  . COVID-19 Vaccine  Completed  . PNA vac Low Risk Adult  Completed    Health Maintenance  There are no preventive care reminders to display for this patient.  Colorectal cancer screening: No longer required.   Mammogram status: Completed 11/04/2020. Repeat every year  Bone Density status: Completed 10/30/2020. Results reflect: Bone density results: OSTEOPENIA. Repeat every 2 years.  Lung Cancer Screening: (Low Dose CT Chest recommended if Age 63-80 years, 30 pack-year currently smoking OR have quit w/in 15years.) does not qualify.    Additional Screening:  Hepatitis C Screening: does not qualify; Completed N/A  Vision Screening: Recommended annual ophthalmology exams for early detection of glaucoma and other disorders of the eye. Is the patient up to date with their annual eye exam?  Yes  Who is the provider or what is the name of the office in which the patient attends annual eye exams? Dr. Gershon Crane If pt is not established with a provider, would they like to be referred to a provider to establish care? No .   Dental  Screening: Recommended annual dental exams for proper oral hygiene  Community Resource Referral / Chronic Care Management: CRR required this visit?  No   CCM required this visit?  No      Plan:     I have personally reviewed and noted the following in the patient's chart:   . Medical and social history . Use of alcohol, tobacco or illicit drugs  . Current medications and supplements . Functional ability and status . Nutritional status . Physical activity . Advanced directives . List of other physicians . Hospitalizations, surgeries, and ER visits in previous 12 months . Vitals . Screenings to include cognitive, depression, and falls . Referrals and appointments  In addition, I have reviewed and discussed with patient certain preventive protocols, quality metrics, and best practice recommendations. A written personalized care plan for preventive services as well as general preventive health recommendations were provided to patient.   Due to this being a telephonic visit, the after visit summary with patients personalized plan was offered to patient via office or my-chart. Patient preferred to pick up at office at next visit or via mychart.  Andrez Grime, LPN   01/05/7123

## 2021-01-07 NOTE — Patient Instructions (Signed)
Joanna White , Thank you for taking time to come for your Medicare Wellness Visit. I appreciate your ongoing commitment to your health goals. Please review the following plan we discussed and let me know if I can assist you in the future.   Screening recommendations/referrals: Colonoscopy: no longer required  Mammogram: Up to date, completed 11/04/2020, due 10/2021 Bone Density: Up to date, completed 10/30/2020, due 10/2022 Recommended yearly ophthalmology/optometry visit for glaucoma screening and checkup Recommended yearly dental visit for hygiene and checkup  Vaccinations: Influenza vaccine: Up to date, completed 11/12/2020, due 06/2021 Pneumococcal vaccine: Completed series Tdap vaccine: decline-insurance Shingles vaccine: due, check with your insurance regarding coverage if interested    Covid-19:Completed series  Advanced directives: Please bring a copy of your POA (Power of Attorney) and/or Living Will to your next appointment.   Conditions/risks identified: hyperlipidemia   Next appointment: Follow up in one year for your annual wellness visit    Preventive Care 65 Years and Older, Female Preventive care refers to lifestyle choices and visits with your health care provider that can promote health and wellness. What does preventive care include?  A yearly physical exam. This is also called an annual well check.  Dental exams once or twice a year.  Routine eye exams. Ask your health care provider how often you should have your eyes checked.  Personal lifestyle choices, including:  Daily care of your teeth and gums.  Regular physical activity.  Eating a healthy diet.  Avoiding tobacco and drug use.  Limiting alcohol use.  Practicing safe sex.  Taking low-dose aspirin every day.  Taking vitamin and mineral supplements as recommended by your health care provider. What happens during an annual well check? The services and screenings done by your health care  provider during your annual well check will depend on your age, overall health, lifestyle risk factors, and family history of disease. Counseling  Your health care provider may ask you questions about your:  Alcohol use.  Tobacco use.  Drug use.  Emotional well-being.  Home and relationship well-being.  Sexual activity.  Eating habits.  History of falls.  Memory and ability to understand (cognition).  Work and work Statistician.  Reproductive health. Screening  You may have the following tests or measurements:  Height, weight, and BMI.  Blood pressure.  Lipid and cholesterol levels. These may be checked every 5 years, or more frequently if you are over 76 years old.  Skin check.  Lung cancer screening. You may have this screening every year starting at age 39 if you have a 30-pack-year history of smoking and currently smoke or have quit within the past 15 years.  Fecal occult blood test (FOBT) of the stool. You may have this test every year starting at age 45.  Flexible sigmoidoscopy or colonoscopy. You may have a sigmoidoscopy every 5 years or a colonoscopy every 10 years starting at age 58.  Hepatitis C blood test.  Hepatitis B blood test.  Sexually transmitted disease (STD) testing.  Diabetes screening. This is done by checking your blood sugar (glucose) after you have not eaten for a while (fasting). You may have this done every 1-3 years.  Bone density scan. This is done to screen for osteoporosis. You may have this done starting at age 51.  Mammogram. This may be done every 1-2 years. Talk to your health care provider about how often you should have regular mammograms. Talk with your health care provider about your test results, treatment options, and if  necessary, the need for more tests. Vaccines  Your health care provider may recommend certain vaccines, such as:  Influenza vaccine. This is recommended every year.  Tetanus, diphtheria, and acellular  pertussis (Tdap, Td) vaccine. You may need a Td booster every 10 years.  Zoster vaccine. You may need this after age 47.  Pneumococcal 13-valent conjugate (PCV13) vaccine. One dose is recommended after age 57.  Pneumococcal polysaccharide (PPSV23) vaccine. One dose is recommended after age 72. Talk to your health care provider about which screenings and vaccines you need and how often you need them. This information is not intended to replace advice given to you by your health care provider. Make sure you discuss any questions you have with your health care provider. Document Released: 12/13/2015 Document Revised: 08/05/2016 Document Reviewed: 09/17/2015 Elsevier Interactive Patient Education  2017 [place] Prevention in the Home Falls can cause injuries. They can happen to people of all ages. There are many things you can do to make your home safe and to help prevent falls. What can I do on the outside of my home?  Regularly fix the edges of walkways and driveways and fix any cracks.  Remove anything that might make you trip as you walk through a door, such as a raised step or threshold.  Trim any bushes or trees on the path to your home.  Use bright outdoor lighting.  Clear any walking paths of anything that might make someone trip, such as rocks or tools.  Regularly check to see if handrails are loose or broken. Make sure that both sides of any steps have handrails.  Any raised decks and porches should have guardrails on the edges.  Have any leaves, snow, or ice cleared regularly.  Use sand or salt on walking paths during winter.  Clean up any spills in your garage right away. This includes oil or grease spills. What can I do in the bathroom?  Use night lights.  Install grab bars by the toilet and in the tub and shower. Do not use towel bars as grab bars.  Use non-skid mats or decals in the tub or shower.  If you need to sit down in the shower, use a plastic,  non-slip stool.  Keep the floor dry. Clean up any water that spills on the floor as soon as it happens.  Remove soap buildup in the tub or shower regularly.  Attach bath mats securely with double-sided non-slip rug tape.  Do not have throw rugs and other things on the floor that can make you trip. What can I do in the bedroom?  Use night lights.  Make sure that you have a light by your bed that is easy to reach.  Do not use any sheets or blankets that are too big for your bed. They should not hang down onto the floor.  Have a firm chair that has side arms. You can use this for support while you get dressed.  Do not have throw rugs and other things on the floor that can make you trip. What can I do in the kitchen?  Clean up any spills right away.  Avoid walking on wet floors.  Keep items that you use a lot in easy-to-reach places.  If you need to reach something above you, use a strong step stool that has a grab bar.  Keep electrical cords out of the way.  Do not use floor polish or wax that makes floors slippery. If you must  use wax, use non-skid floor wax.  Do not have throw rugs and other things on the floor that can make you trip. What can I do with my stairs?  Do not leave any items on the stairs.  Make sure that there are handrails on both sides of the stairs and use them. Fix handrails that are broken or loose. Make sure that handrails are as long as the stairways.  Check any carpeting to make sure that it is firmly attached to the stairs. Fix any carpet that is loose or worn.  Avoid having throw rugs at the top or bottom of the stairs. If you do have throw rugs, attach them to the floor with carpet tape.  Make sure that you have a light switch at the top of the stairs and the bottom of the stairs. If you do not have them, ask someone to add them for you. What else can I do to help prevent falls?  Wear shoes that:  Do not have high heels.  Have rubber  bottoms.  Are comfortable and fit you well.  Are closed at the toe. Do not wear sandals.  If you use a stepladder:  Make sure that it is fully opened. Do not climb a closed stepladder.  Make sure that both sides of the stepladder are locked into place.  Ask someone to hold it for you, if possible.  Clearly mark and make sure that you can see:  Any grab bars or handrails.  First and last steps.  Where the edge of each step is.  Use tools that help you move around (mobility aids) if they are needed. These include:  Canes.  Walkers.  Scooters.  Crutches.  Turn on the lights when you go into a dark area. Replace any light bulbs as soon as they burn out.  Set up your furniture so you have a clear path. Avoid moving your furniture around.  If any of your floors are uneven, fix them.  If there are any pets around you, be aware of where they are.  Review your medicines with your doctor. Some medicines can make you feel dizzy. This can increase your chance of falling. Ask your doctor what other things that you can do to help prevent falls. This information is not intended to replace advice given to you by your health care provider. Make sure you discuss any questions you have with your health care provider. Document Released: 09/12/2009 Document Revised: 04/23/2016 Document Reviewed: 12/21/2014 Elsevier Interactive Patient Education  2017 [employer].

## 2021-01-07 NOTE — Progress Notes (Signed)
PCP notes:  Health Maintenance: Tdap- insurance    Abnormal Screenings: none   Patient concerns: none   Nurse concerns: none   Next PCP appt.: 01/14/2021 @ 11:30 am

## 2021-01-08 ENCOUNTER — Other Ambulatory Visit: Payer: Self-pay

## 2021-01-08 ENCOUNTER — Other Ambulatory Visit (INDEPENDENT_AMBULATORY_CARE_PROVIDER_SITE_OTHER): Payer: Medicare HMO

## 2021-01-08 DIAGNOSIS — E02 Subclinical iodine-deficiency hypothyroidism: Secondary | ICD-10-CM | POA: Diagnosis not present

## 2021-01-08 DIAGNOSIS — E785 Hyperlipidemia, unspecified: Secondary | ICD-10-CM

## 2021-01-08 DIAGNOSIS — D751 Secondary polycythemia: Secondary | ICD-10-CM

## 2021-01-08 LAB — COMPREHENSIVE METABOLIC PANEL
ALT: 18 U/L (ref 0–35)
AST: 22 U/L (ref 0–37)
Albumin: 4.4 g/dL (ref 3.5–5.2)
Alkaline Phosphatase: 50 U/L (ref 39–117)
BUN: 13 mg/dL (ref 6–23)
CO2: 29 mEq/L (ref 19–32)
Calcium: 10.1 mg/dL (ref 8.4–10.5)
Chloride: 102 mEq/L (ref 96–112)
Creatinine, Ser: 0.87 mg/dL (ref 0.40–1.20)
GFR: 62.98 mL/min (ref >60.00)
Glucose, Bld: 95 mg/dL (ref 70–99)
Potassium: 4.5 mEq/L (ref 3.5–5.1)
Sodium: 139 mEq/L (ref 135–145)
Total Bilirubin: 0.8 mg/dL (ref 0.2–1.2)
Total Protein: 7.5 g/dL (ref 6.0–8.3)

## 2021-01-08 LAB — LIPID PANEL
Cholesterol: 238 mg/dL — ABNORMAL HIGH (ref 0–200)
HDL: 49.1 mg/dL (ref >39.00)
LDL Cholesterol: 158 mg/dL — ABNORMAL HIGH (ref 0–99)
NonHDL: 188.57
Total CHOL/HDL Ratio: 5
Triglycerides: 153 mg/dL — ABNORMAL HIGH (ref 0.0–149.0)
VLDL: 30.6 mg/dL (ref 0.0–40.0)

## 2021-01-08 LAB — TSH: TSH: 4.95 u[IU]/mL — ABNORMAL HIGH (ref 0.35–4.50)

## 2021-01-08 LAB — CBC WITH DIFFERENTIAL/PLATELET
Basophils Absolute: 0.1 10*3/uL (ref 0.0–0.1)
Basophils Relative: 0.7 % (ref 0.0–3.0)
Eosinophils Absolute: 0.4 10*3/uL (ref 0.0–0.7)
Eosinophils Relative: 5.6 % — ABNORMAL HIGH (ref 0.0–5.0)
HCT: 46.9 % — ABNORMAL HIGH (ref 36.0–46.0)
Hemoglobin: 15.5 g/dL — ABNORMAL HIGH (ref 12.0–15.0)
Lymphocytes Relative: 36.9 % (ref 12.0–46.0)
Lymphs Abs: 2.8 10*3/uL (ref 0.7–4.0)
MCHC: 33 g/dL (ref 30.0–36.0)
MCV: 90.8 fl (ref 78.0–100.0)
Monocytes Absolute: 1 10*3/uL (ref 0.1–1.0)
Monocytes Relative: 12.5 % — ABNORMAL HIGH (ref 3.0–12.0)
Neutro Abs: 3.4 10*3/uL (ref 1.4–7.7)
Neutrophils Relative %: 44.3 % (ref 43.0–77.0)
Platelets: 232 10*3/uL (ref 150.0–400.0)
RBC: 5.16 Mil/uL — ABNORMAL HIGH (ref 3.87–5.11)
RDW: 13.8 % (ref 11.5–15.5)
WBC: 7.7 10*3/uL (ref 4.0–10.5)

## 2021-01-10 ENCOUNTER — Other Ambulatory Visit (INDEPENDENT_AMBULATORY_CARE_PROVIDER_SITE_OTHER): Payer: Medicare HMO

## 2021-01-10 ENCOUNTER — Telehealth: Payer: Self-pay | Admitting: Family Medicine

## 2021-01-10 DIAGNOSIS — R7989 Other specified abnormal findings of blood chemistry: Secondary | ICD-10-CM

## 2021-01-10 LAB — T4, FREE: Free T4: 1.01 ng/dL (ref 0.60–1.60)

## 2021-01-10 NOTE — Telephone Encounter (Signed)
Pt called in wanted to know if its okay to do the video physical. Please advise.. she didn't want to come in due to it being a lot of people and the weather on Sunday

## 2021-01-10 NOTE — Telephone Encounter (Signed)
I don't know if her insurance would cover virtual.  She may need to call to verify prior to changing.

## 2021-01-13 NOTE — Telephone Encounter (Signed)
Pt states insurance will cover. I have changed her over to a virtual visit.

## 2021-01-14 ENCOUNTER — Other Ambulatory Visit: Payer: Self-pay

## 2021-01-14 ENCOUNTER — Telehealth (INDEPENDENT_AMBULATORY_CARE_PROVIDER_SITE_OTHER): Payer: Medicare HMO | Admitting: Family Medicine

## 2021-01-14 ENCOUNTER — Encounter: Payer: Self-pay | Admitting: Family Medicine

## 2021-01-14 ENCOUNTER — Telehealth: Payer: Self-pay

## 2021-01-14 VITALS — BP 129/69 | HR 91 | Ht 60.0 in | Wt 142.4 lb

## 2021-01-14 DIAGNOSIS — Z853 Personal history of malignant neoplasm of breast: Secondary | ICD-10-CM | POA: Diagnosis not present

## 2021-01-14 DIAGNOSIS — Z0000 Encounter for general adult medical examination without abnormal findings: Secondary | ICD-10-CM | POA: Insufficient documentation

## 2021-01-14 DIAGNOSIS — M85852 Other specified disorders of bone density and structure, left thigh: Secondary | ICD-10-CM

## 2021-01-14 DIAGNOSIS — E02 Subclinical iodine-deficiency hypothyroidism: Secondary | ICD-10-CM

## 2021-01-14 DIAGNOSIS — Z7189 Other specified counseling: Secondary | ICD-10-CM | POA: Diagnosis not present

## 2021-01-14 DIAGNOSIS — E785 Hyperlipidemia, unspecified: Secondary | ICD-10-CM

## 2021-01-14 DIAGNOSIS — H4010X0 Unspecified open-angle glaucoma, stage unspecified: Secondary | ICD-10-CM | POA: Diagnosis not present

## 2021-01-14 MED ORDER — LEVOTHYROXINE SODIUM 75 MCG PO TABS: 75.0000 ug | ORAL_TABLET | Freq: Every day | ORAL | 3 refills | Status: DC

## 2021-01-14 MED ORDER — ONE-DAILY MULTI VITAMINS PO TABS: 1.0000 | ORAL_TABLET | Freq: Every day | ORAL | Status: DC

## 2021-01-14 NOTE — Assessment & Plan Note (Signed)
Regularly sees ophthalmology.

## 2021-01-14 NOTE — Assessment & Plan Note (Signed)
Continue vitamin D replacement and multivitamin with calcium. Continue good dairy intake.

## 2021-01-14 NOTE — Telephone Encounter (Signed)
Plz schedule wellness, lab and cpe for 2023.

## 2021-01-14 NOTE — Assessment & Plan Note (Signed)
Appreciate onc care.  

## 2021-01-14 NOTE — Assessment & Plan Note (Addendum)
Chronic, denies hypothyroid symptoms. She has been taking levothyroxine 1/2 tab once a week. Will increase thyroid medicine to once daily.

## 2021-01-14 NOTE — Assessment & Plan Note (Signed)
Chronic, stable off med. Reviewed diet choices to keep cholesterol under control. The ASCVD Risk score Joanna Bussing DC Jr., et al., 2013) failed to calculate for the following reasons:   The 2013 ASCVD risk score is only valid for ages 64 to 79

## 2021-01-14 NOTE — Progress Notes (Signed)
Patient ID: Joanna White, female    DOB: 03-18-1941, 80 y.o.   MRN: [id number]  Virtual visit attempted through [employer], a video enabled telemedicine application. Due to national recommendations of social distancing due to COVID-19, a virtual visit is felt to be most appropriate for this patient at this time. Reviewed limitations, risks, security and privacy concerns of performing a virtual visit and the availability of in person appointments. I also reviewed that there may be a patient responsible charge related to this service. The patient agreed to proceed.   Interactive audio and video telecommunications were attempted between myself and Mayerly Kaman, however failed due to patient having technical difficulties OR patient not having access to video capability.  We continued and completed visit with audio only.  Time: 11:42am - 12:06pm   Patient location: home Provider location: [place] [facility], office Persons participating in this virtual visit: patient, provider   If any vitals were documented, they were collected by patient at home unless specified below.    BP 129/69   Pulse 91   Ht 5' (1.524 m)   Wt 142 lb 6 oz (64.6 kg)   BMI 27.81 kg/m    CC: CPE Subjective:   HPI: Joanna White is a 80 y.o. female presenting on 01/14/2021 for Annual Exam (Prt 2. )   Saw health advisor last week for medicare wellness visit. Note reviewed.  Pt states she checked with insurance to ensure physical was covered.   No exam data present  Flowsheet Row Clinical Support from 01/07/2021 in [facility] [place]  PHQ-2 Total Score 0      Fall Risk  01/07/2021 08/04/2019 03/02/2018 03/01/2017 09/11/2016  Falls in the past year? 0 0 No No No  Number falls in past yr: 0 - - - -  Injury with Fall? 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   Preventative: Colon cancer screening -cologuard 02/2016 and 08/2019 WNL. Will age out.  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 Mammo Birads2 10/2020. Sees onc yearly. Well woman exam -aged out. DEXA12/2016osteopenia, T -1.1 spine, hip -1.7, DEXA T -1.5 hip, -0.2 spine (improvement) 03/2018. DEXA 10/2020 - T -2.2 L femur neck, -0.4 spine. Drinks 1 gallon milk/wk.  Lung cancer screening -never smoker  Flu shotyearly  Andrew 12/2019, 01/2020, 2020/10/09  Tetanus shot -unsure  Pneumovax 2015, prevnar 2017 zostavax - 2017 Shingrix - discussed. Advanced directive discussion -does not have set upbut has talked to children to let he wishes known. Does not want prolonged life support. Would be ok with temporary measure. Brother had prolonged illness in acute care facility and passed away 2020-10-09. HCPOA would be 2 children. Packet provided today.  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 Interim medical history since our last visit reviewed. Allergies and medications reviewed and updated. Outpatient Medications Prior to Visit  Medication Sig Dispense Refill  . Cholecalciferol (VITAMIN D3) 1000 units CAPS Take 1 capsule (1,000 Units total) by mouth daily. 30 capsule   . latanoprost (XALATAN) 0.005 % ophthalmic  solution Place into both eyes at bedtime.     . OIL OF OREGANO PO Take by mouth.    . levothyroxine (SYNTHROID) 75 MCG tablet TAKE 1 TABLET DAILY& ONE-HALF TABLET 1 DAY A WEEK 90 tablet 0  . Multiple Vitamin (MULTIVITAMIN) tablet Take 1 tablet by mouth daily.     No facility-administered medications prior to visit.     Per HPI unless specifically indicated in ROS section below Review of Systems  Constitutional: Negative for activity change, appetite change, chills, fatigue, fever and unexpected weight change.  HENT: Negative for hearing loss.   Eyes: Negative for visual disturbance.  Respiratory: Negative for cough, chest tightness, shortness of breath and wheezing.   Cardiovascular: Negative for chest pain, palpitations and leg swelling.  Gastrointestinal: Negative for abdominal distention, abdominal pain, blood in stool, constipation, diarrhea, nausea and vomiting.  Genitourinary: Negative for difficulty urinating and hematuria.  Musculoskeletal: Negative for arthralgias, myalgias and neck pain.  Skin: Negative for rash.  Neurological: Negative for dizziness, seizures, syncope and headaches.  Hematological: Negative for adenopathy. Does not bruise/bleed easily.  Psychiatric/Behavioral: Negative for dysphoric mood. The patient is not nervous/anxious.    Objective:  BP 129/69   Pulse 91   Ht 5' (1.524 m)   Wt 142 lb 6 oz (64.6 kg)   BMI 27.81 kg/m   Wt Readings from Last 3 Encounters:  01/14/21 142 lb 6 oz (64.6 kg)  10/22/20 142 lb (64.4 kg)  09/07/19 145 lb 8 oz (66 kg)       Physical exam: Pulm: speaks in complete sentences without increased work of breathing Psych: normal mood, normal thought content      Results for orders placed or performed in visit on 01/10/21  T4, free  Result Value Ref Range   Free T4 1.01 0.60 - 1.60 ng/dL   Assessment & Plan:   Problem List Items Addressed This Visit    Osteopenia    Continue vitamin D replacement and multivitamin with  calcium. Continue good dairy intake.       Open-angle glaucoma    Regularly sees ophthalmology.      Hypothyroidism    Chronic, denies hypothyroid symptoms. She has been taking levothyroxine 1/2 tab once a week. Will increase thyroid medicine to once daily.       Relevant Medications   levothyroxine (SYNTHROID) 75 MCG tablet   History of left breast cancer    Appreciate onc care.      Health maintenance examination - Primary    Preventative protocols reviewed and updated unless pt declined. Discussed healthy diet and lifestyle.       Dyslipidemia    Chronic, stable off med. Reviewed diet choices to keep cholesterol under control. The ASCVD Risk score Mikey Bussing DC Jr., et al., 2013) failed to calculate for the following reasons:   The 2013 ASCVD risk score is only valid for ages 66 to 28       Advanced care planning/counseling discussion    Advanced directive discussion -does not have set upbut has talked to children to let he wishes known.  Does not want prolonged life support. Would be ok with temporary measure. Brother had prolonged illness in acute care facility and passed away 10/12/2020. HCPOA would be 2 children. Packet provided today.           Meds ordered this encounter  Medications  . Multiple Vitamin (MULTIVITAMIN) tablet    Sig: Take 1 tablet by mouth daily. With calcium  . levothyroxine (SYNTHROID) 75 MCG tablet    Sig: Take 1 tablet (75 mcg total) by mouth daily before breakfast.    Dispense:  90 tablet    Refill:  3   No orders of the defined types were placed in this encounter.   I discussed the assessment and treatment plan with the patient. The patient was provided an opportunity to ask questions and all were answered. The patient agreed with the plan and demonstrated an understanding of the instructions. The patient was advised to call back or seek an in-person evaluation if the symptoms worsen or if the condition fails to improve as anticipated.  Follow  up plan: No follow-ups on file.  Ria Bush, MD

## 2021-01-14 NOTE — Assessment & Plan Note (Signed)
Preventative protocols reviewed and updated unless pt declined. Discussed healthy diet and lifestyle.  

## 2021-01-14 NOTE — Assessment & Plan Note (Signed)
Advanced directive discussion -does not have set upbut has talked to children to let he wishes known. Does not want prolonged life support. Would be ok with temporary measure. Brother had prolonged illness in acute care facility and passed away 10/12/2020. HCPOA would be 2 children. Packet provided today.

## 2021-04-07 DIAGNOSIS — H401132 Primary open-angle glaucoma, bilateral, moderate stage: Secondary | ICD-10-CM | POA: Diagnosis not present

## 2021-07-19 IMAGING — DX DG CLAVICLE*R*
2 series · 2 of 2 positions shown · non-contrast
Comparison: None.

CLINICAL DATA: Right clavicular prominence, initial encounter

EXAM:
RIGHT CLAVICLE - 2+ VIEWS

[clavicle ap]
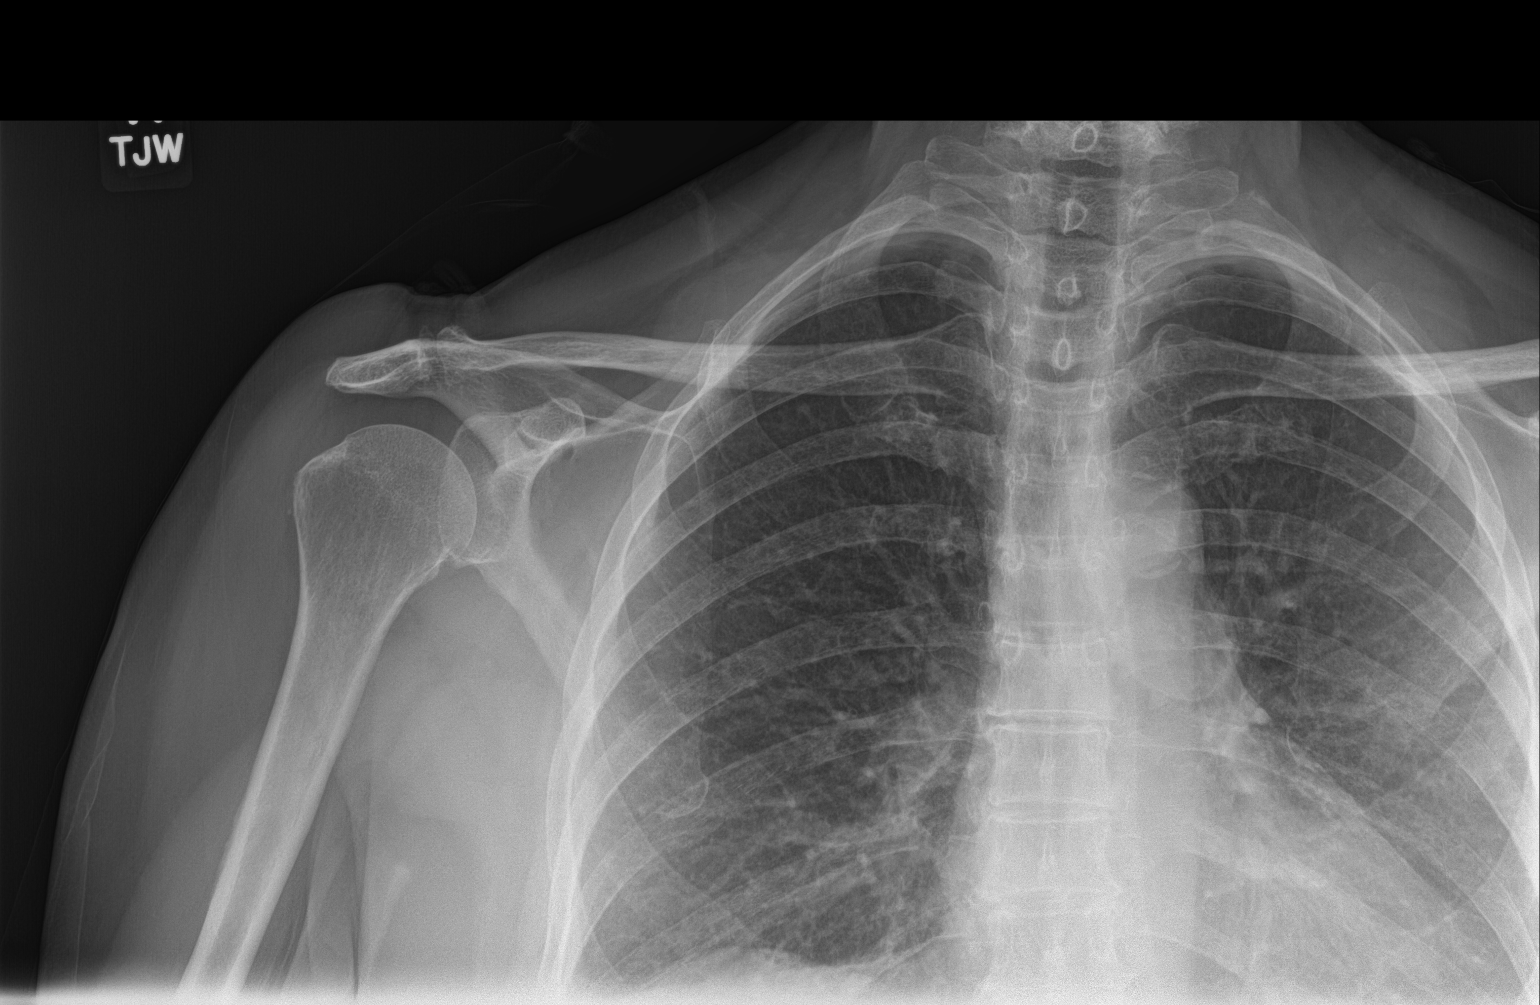

[clavicle axial]
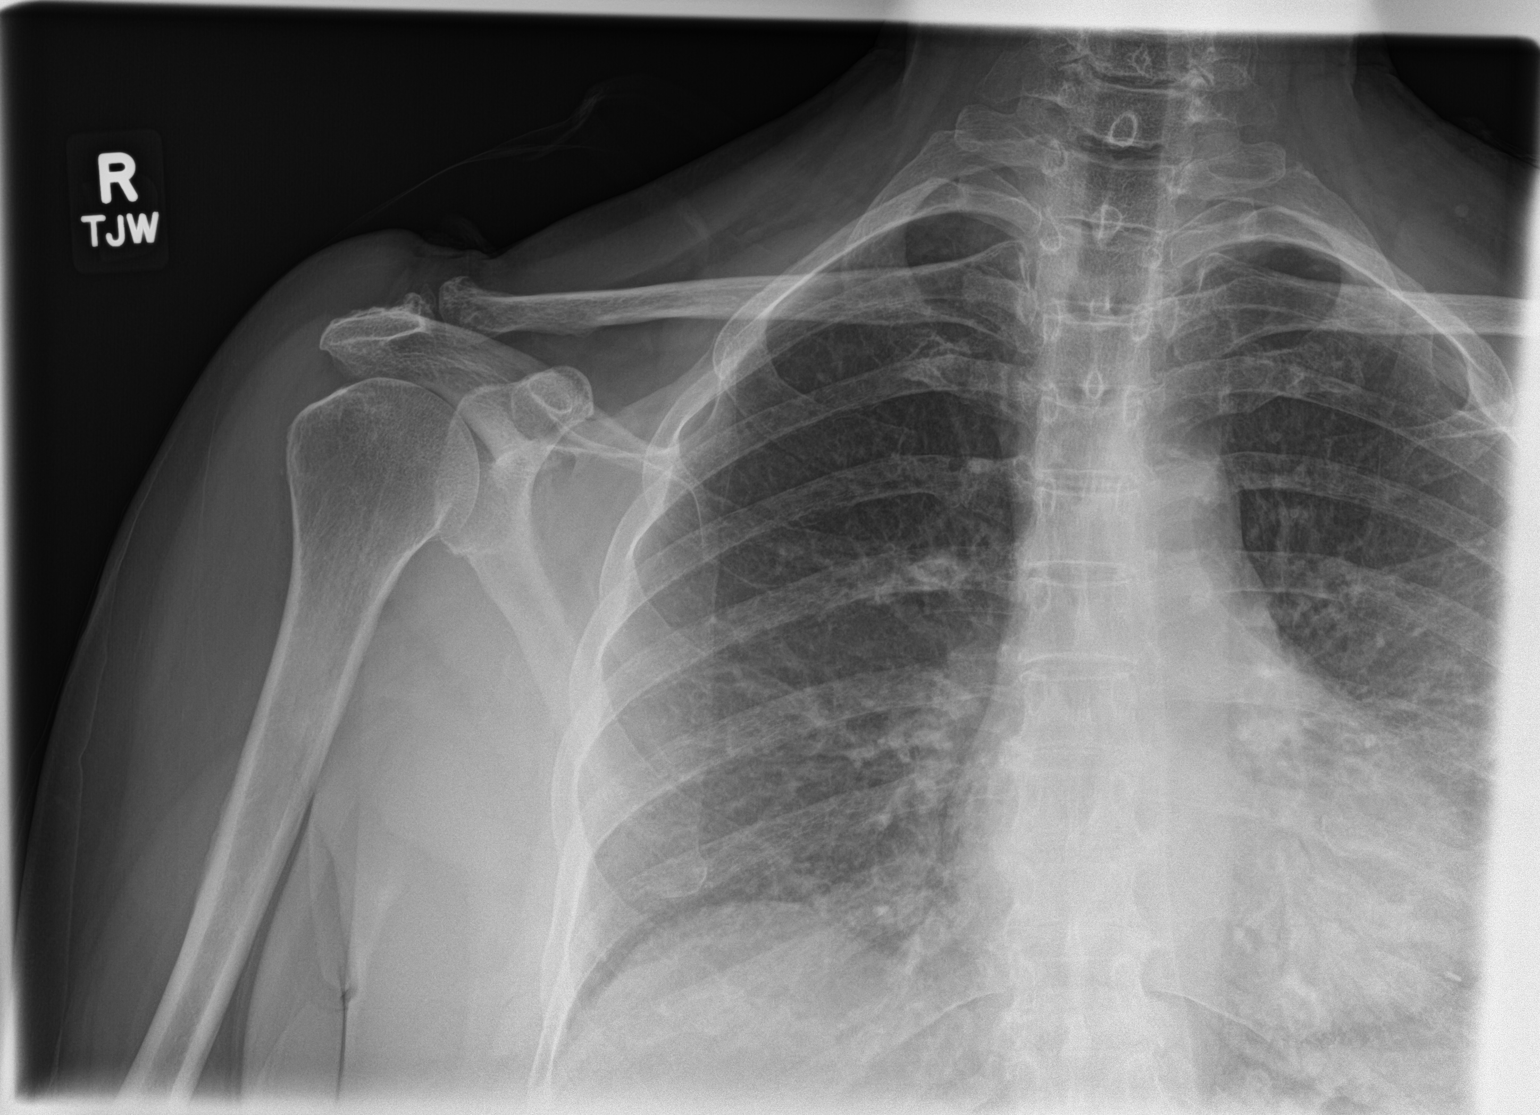

[2 of 2 positions shown; findings below may reference images not displayed]

FINDINGS: Clavicle is well visualized without evidence of acute fracture.
Degenerative changes at the acromioclavicular joint are seen. No
other focal abnormality is noted.
IMPRESSION: Degenerative change without acute abnormality.

## 2021-08-11 DIAGNOSIS — H401132 Primary open-angle glaucoma, bilateral, moderate stage: Secondary | ICD-10-CM | POA: Diagnosis not present

## 2021-08-11 DIAGNOSIS — Z961 Presence of intraocular lens: Secondary | ICD-10-CM | POA: Diagnosis not present

## 2021-10-01 ENCOUNTER — Telehealth: Payer: Self-pay | Admitting: Hematology & Oncology

## 2021-10-21 ENCOUNTER — Other Ambulatory Visit: Payer: Medicare Other

## 2021-10-21 ENCOUNTER — Ambulatory Visit: Payer: Self-pay | Admitting: Family

## 2021-10-21 ENCOUNTER — Ambulatory Visit: Payer: Medicare Other | Admitting: Hematology & Oncology

## 2021-10-21 ENCOUNTER — Inpatient Hospital Stay: Payer: Medicare HMO

## 2021-10-22 ENCOUNTER — Inpatient Hospital Stay: Payer: Medicare HMO | Admitting: Family

## 2021-10-22 ENCOUNTER — Encounter: Payer: Self-pay | Admitting: Family

## 2021-10-22 ENCOUNTER — Telehealth: Payer: Self-pay | Admitting: *Deleted

## 2021-10-22 ENCOUNTER — Inpatient Hospital Stay: Payer: Medicare HMO | Attending: Hematology & Oncology

## 2021-10-22 ENCOUNTER — Other Ambulatory Visit: Payer: Self-pay

## 2021-10-22 VITALS — BP 151/79 | HR 90 | Temp 98.2°F | Resp 18 | Ht 59.84 in | Wt 144.0 lb

## 2021-10-22 DIAGNOSIS — Z853 Personal history of malignant neoplasm of breast: Secondary | ICD-10-CM | POA: Diagnosis not present

## 2021-10-22 DIAGNOSIS — Z79899 Other long term (current) drug therapy: Secondary | ICD-10-CM | POA: Diagnosis not present

## 2021-10-22 DIAGNOSIS — C50912 Malignant neoplasm of unspecified site of left female breast: Secondary | ICD-10-CM | POA: Insufficient documentation

## 2021-10-22 DIAGNOSIS — Z171 Estrogen receptor negative status [ER-]: Secondary | ICD-10-CM | POA: Diagnosis not present

## 2021-10-22 DIAGNOSIS — E038 Other specified hypothyroidism: Secondary | ICD-10-CM | POA: Diagnosis not present

## 2021-10-22 LAB — CBC WITH DIFFERENTIAL (CANCER CENTER ONLY)
Abs Immature Granulocytes: 0.05 10*3/uL (ref 0.00–0.07)
Basophils Absolute: 0.1 10*3/uL (ref 0.0–0.1)
Basophils Relative: 1 %
Eosinophils Absolute: 0.3 10*3/uL (ref 0.0–0.5)
Eosinophils Relative: 2 %
HCT: 47 % — ABNORMAL HIGH (ref 36.0–46.0)
Hemoglobin: 15.8 g/dL — ABNORMAL HIGH (ref 12.0–15.0)
Immature Granulocytes: 0 %
Lymphocytes Relative: 18 %
Lymphs Abs: 2.3 10*3/uL (ref 0.7–4.0)
MCH: 30.6 pg (ref 26.0–34.0)
MCHC: 33.6 g/dL (ref 30.0–36.0)
MCV: 90.9 fL (ref 80.0–100.0)
Monocytes Absolute: 1.3 10*3/uL — ABNORMAL HIGH (ref 0.1–1.0)
Monocytes Relative: 10 %
Neutro Abs: 8.8 10*3/uL — ABNORMAL HIGH (ref 1.7–7.7)
Neutrophils Relative %: 69 %
Platelet Count: 224 10*3/uL (ref 150–400)
RBC: 5.17 MIL/uL — ABNORMAL HIGH (ref 3.87–5.11)
RDW: 13 % (ref 11.5–15.5)
WBC Count: 12.9 10*3/uL — ABNORMAL HIGH (ref 4.0–10.5)
nRBC: 0 % (ref 0.0–0.2)

## 2021-10-22 LAB — CMP (CANCER CENTER ONLY)
ALT: 15 U/L (ref 0–44)
AST: 19 U/L (ref 15–41)
Albumin: 4.5 g/dL (ref 3.5–5.0)
Alkaline Phosphatase: 49 U/L (ref 38–126)
Anion gap: 9 (ref 5–15)
BUN: 13 mg/dL (ref 8–23)
CO2: 27 mmol/L (ref 22–32)
Calcium: 10.3 mg/dL (ref 8.9–10.3)
Chloride: 102 mmol/L (ref 98–111)
Creatinine: 0.83 mg/dL (ref 0.44–1.00)
GFR, Estimated: >60 mL/min (ref >60)
Glucose, Bld: 111 mg/dL — ABNORMAL HIGH (ref 70–99)
Potassium: 4.4 mmol/L (ref 3.5–5.1)
Sodium: 138 mmol/L (ref 135–145)
Total Bilirubin: 0.7 mg/dL (ref 0.3–1.2)
Total Protein: 7.7 g/dL (ref 6.5–8.1)

## 2021-10-22 LAB — LACTATE DEHYDROGENASE: LDH: 162 U/L (ref 98–192)

## 2021-10-22 NOTE — Progress Notes (Signed)
Hematology and Oncology Follow Up Visit  Dajia Gunnels [id number] [birth year] 80 y.o. 10/22/2021   Principle Diagnosis:  Stage IIA (T2 N0 M0) ductal carcinoma of the left breast.    Current Therapy:        Observation   Interim History:  Ms. Cregan is here today for follow-up. She is doing well and has no complaints at this time.  She states that she has scheduled her annual mammogram next month.  No changes on exam. No mass noted.  No lymphedema or adenopathy noted.  She denies any issue with infection. No fever, chills, n/v, cough, rash, dizziness, SOB, chest pain, palpitations, abdominal pain or changes in bowel or bladder habits.  No swelling, tenderness, numbness or tingling in her extremities.  No falls or syncope.  She has maintained a good appetite and is looking forward to making Thanksgiving dinner for her family. She is hydrating well and her weight is stable at 144 lbs.   ECOG Performance Status: 1 - Symptomatic but completely ambulatory  Medications:  Allergies as of 10/22/2021       Reactions   Flonase  [fluticasone Propionate] Anaphylaxis   Fluticasone Propionate Other (See Comments)   REACTION: throat tight        Medication List        Accurate as of October 22, 2021 11:39 AM. If you have any questions, ask your nurse or doctor.          latanoprost 0.005 % ophthalmic solution Commonly known as: XALATAN Place into both eyes at bedtime.   levothyroxine 75 MCG tablet Commonly known as: SYNTHROID Take 1 tablet (75 mcg total) by mouth daily before breakfast.   multivitamin tablet Take 1 tablet by mouth daily. With calcium   OIL OF OREGANO PO Take by mouth.   Vitamin D3 25 MCG (1000 UT) Caps Take 1 capsule (1,000 Units total) by mouth daily.        Allergies:  Allergies  Allergen Reactions   Flonase  [Fluticasone Propionate] Anaphylaxis   Fluticasone Propionate Other (See Comments)    REACTION: throat tight    Past  Medical History, Surgical history, Social history, and Family History were reviewed and updated.  Review of Systems: All other 10 point review of systems is negative.   Physical Exam:  height is 4' 11.84" (1.52 m) and weight is 144 lb (65.3 kg). Her oral temperature is 98.2 F (36.8 C). Her blood pressure is 151/79 (abnormal) and her pulse is 90. Her respiration is 18 and oxygen saturation is 96%.   Wt Readings from Last 3 Encounters:  10/22/21 144 lb (65.3 kg)  01/14/21 142 lb 6 oz (64.6 kg)  10/22/20 142 lb (64.4 kg)    Ocular: Sclerae unicteric, pupils equal, round and reactive to light Ear-nose-throat: Oropharynx clear, dentition fair Lymphatic: No cervical or supraclavicular adenopathy Lungs no rales or rhonchi, good excursion bilaterally Heart regular rate and rhythm, no murmur appreciated Abd soft, nontender, positive bowel sounds MSK no focal spinal tenderness, no joint edema Neuro: non-focal, well-oriented, appropriate affect  Lab Results  Component Value Date   WBC 12.9 (H) 10/22/2021   HGB 15.8 (H) 10/22/2021   HCT 47.0 (H) 10/22/2021   MCV 90.9 10/22/2021   PLT 224 10/22/2021   No results found for: FERRITIN, IRON, TIBC, UIBC, IRONPCTSAT Lab Results  Component Value Date   RBC 5.17 (H) 10/22/2021   No results found for: KPAFRELGTCHN, LAMBDASER, KAPLAMBRATIO No results found for: IGGSERUM, IGA, IGMSERUM No results found  for: Odetta Pink, SPEI   Chemistry      Component Value Date/Time   NA 139 01/08/2021 0831   NA 142 09/08/2017 1028   K 4.5 01/08/2021 0831   K 4.5 09/08/2017 1028   CL 102 01/08/2021 0831   CL 103 09/14/2014 1111   CO2 29 01/08/2021 0831   CO2 26 09/08/2017 1028   BUN 13 01/08/2021 0831   BUN 18.9 09/08/2017 1028   CREATININE 0.87 01/08/2021 0831   CREATININE 0.81 09/07/2019 0957   CREATININE 1.2 (H) 09/08/2017 1028      Component Value Date/Time   CALCIUM 10.1 01/08/2021 0831    CALCIUM 9.8 09/08/2017 1028   ALKPHOS 50 01/08/2021 0831   ALKPHOS 57 09/08/2017 1028   AST 22 01/08/2021 0831   AST 20 09/07/2019 0957   AST 21 09/08/2017 1028   ALT 18 01/08/2021 0831   ALT 17 09/07/2019 0957   ALT 15 09/08/2017 1028   BILITOT 0.8 01/08/2021 0831   BILITOT 0.7 09/07/2019 0957   BILITOT 0.57 09/08/2017 1028       Impression and Plan: Ms. 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 Her tumor was ER negative.  She is now out over 20 years from diagnosis and doing great. So far there has been no evidence of recurrence.  Mammogram is due next month and she has scheduled.  Follow-up in 1 year.  She can contact our office with any questions or concerns.   Lottie Dawson, NP 11/23/202211:39 AM

## 2021-10-22 NOTE — Telephone Encounter (Signed)
Per 10/22/21 los - gave upcoming appointment - confirmed

## 2021-10-24 LAB — TSH: TSH: 0.698 u[IU]/mL (ref 0.308–3.960)

## 2021-11-05 DIAGNOSIS — Z1231 Encounter for screening mammogram for malignant neoplasm of breast: Secondary | ICD-10-CM | POA: Diagnosis not present

## 2021-11-13 DIAGNOSIS — N6489 Other specified disorders of breast: Secondary | ICD-10-CM | POA: Diagnosis not present

## 2021-11-13 DIAGNOSIS — R928 Other abnormal and inconclusive findings on diagnostic imaging of breast: Secondary | ICD-10-CM | POA: Diagnosis not present

## 2021-11-13 DIAGNOSIS — R922 Inconclusive mammogram: Secondary | ICD-10-CM | POA: Diagnosis not present

## 2021-12-11 ENCOUNTER — Telehealth: Payer: Self-pay | Admitting: Family Medicine

## 2021-12-16 DIAGNOSIS — H401132 Primary open-angle glaucoma, bilateral, moderate stage: Secondary | ICD-10-CM | POA: Diagnosis not present

## 2021-12-17 DIAGNOSIS — R062 Wheezing: Secondary | ICD-10-CM | POA: Diagnosis not present

## 2021-12-29 ENCOUNTER — Other Ambulatory Visit: Payer: Self-pay

## 2022-01-03 ENCOUNTER — Other Ambulatory Visit: Payer: Self-pay | Admitting: Family Medicine

## 2022-01-03 DIAGNOSIS — M85852 Other specified disorders of bone density and structure, left thigh: Secondary | ICD-10-CM

## 2022-01-03 DIAGNOSIS — E785 Hyperlipidemia, unspecified: Secondary | ICD-10-CM

## 2022-01-03 DIAGNOSIS — E039 Hypothyroidism, unspecified: Secondary | ICD-10-CM

## 2022-01-05 NOTE — Progress Notes (Signed)
Subjective:   Joanna White is a 81 y.o. female who presents for Medicare Annual (Subsequent) preventive examination.  I connected with Joanna White today by telephone and verified that I am speaking with the correct person using two identifiers. Location patient: home Location provider: work Persons participating in the virtual visit: patient, Marine scientist.    I discussed the limitations, risks, security and privacy concerns of performing an evaluation and management service by telephone and the availability of in person appointments. I also discussed with the patient that there may be a patient responsible charge related to this service. The patient expressed understanding and verbally consented to this telephonic visit.    Interactive audio and video telecommunications were attempted between this provider and patient, however failed, due to patient having technical difficulties OR patient did not have access to video capability.  We continued and completed visit with audio only.  Some vital signs may be absent or patient reported.   Time Spent with patient on telephone encounter: 20 minutes  Review of Systems     Cardiac Risk Factors include: advanced age (>27men, >31 women);dyslipidemia     Objective:    Today's Vitals   01/08/22 0822  Weight: 140 lb (63.5 kg)  Height: 4\' 11"  (1.499 m)   Body mass index is 28.28 kg/m.  Advanced Directives 01/08/2022 10/22/2021 01/07/2021 10/22/2020 09/08/2018 03/02/2018 09/08/2017  Does Patient Have a Medical Advance Directive? Yes Yes Yes No No No Yes  Type of Paramedic of [place];Living will Living will;Healthcare Power of [place];Living will - - - Living will  Does patient want to make changes to medical advance directive? Yes (MAU/Ambulatory/Procedural Areas - Information given) No - Patient declined - - - No - Patient declined -  Copy of [place] in Chart? - No -  copy requested No - copy requested - - - -  Would patient like information on creating a medical advance directive? - - - No - Patient declined - No - Patient declined -    Current Medications (verified) Outpatient Encounter Medications as of 01/08/2022  Medication Sig   Cholecalciferol (VITAMIN D3) 1000 units CAPS Take 1 capsule (1,000 Units total) by mouth daily.   latanoprost (XALATAN) 0.005 % ophthalmic solution Place into both eyes at bedtime.    levothyroxine (SYNTHROID) 75 MCG tablet Take 1 tablet (75 mcg total) by mouth daily before breakfast.   Multiple Vitamin (MULTIVITAMIN) tablet Take 1 tablet by mouth daily. With calcium   OIL OF [place] PO Take by mouth.   FLUZONE HIGH-DOSE QUADRIVALENT 0.7 ML SUSY    No facility-administered encounter medications on file as of 01/08/2022.    Allergies (verified) Flonase  [fluticasone propionate] and Fluticasone propionate   History: Past Medical History:  Diagnosis Date   Glaucoma    Hypothyroid    Osteopenia 08/2011   h/o osteoporosis, s/p boniva for 5 yrs   Personal history of malignant neoplasm of breast 1998   Left   Past Surgical History:  Procedure Laterality Date   BREAST LUMPECTOMY  1998   Left   CESAREAN SECTION     x 2   DEXA  10/2015   osteopenia, T -1.1 spine, hip -1.7    GYN surgery     uterine lining removed for heavy bleeding and polyp   lymph node removal  1998   left axilla   Thumb surgery     bilateral   TOTAL KNEE ARTHROPLASTY Left 2016   (  [facility])   Family History  Problem Relation Age of Onset   Diabetes Brother        x2   Cancer Father 66       lung   Cancer Mother 66       MM   Coronary artery disease Brother 26       CABG   Diabetes Brother    Diabetes Maternal Grandmother    Vasculitis Nephew        wegener's vasculitis   Stroke Neg Hx    Social History   Socioeconomic History   Marital status: Widowed    Spouse name: Not on file   Number of children: 2   Years of education: Not  on file   Highest education level: Not on file  Occupational History   Occupation: Retired  Tobacco Use   Smoking status: Never   Smokeless tobacco: Never  Vaping Use   Vaping Use: Never used  Substance and Sexual Activity   Alcohol use: No    Alcohol/week: 0.0 standard drinks   Drug use: No   Sexual activity: Never  Other Topics Concern   Not on file  Social History Narrative   Caffeine: 2cups coffee/day   Widow-husband died of MI on operating table   2 children, grown   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   Social Determinants of Health   Financial Resource Strain: Low Risk    Difficulty of Paying Living Expenses: Not hard at all  Food Insecurity: No Food Insecurity   Worried About Charity fundraiser in the Last Year: Never true   Arboriculturist in the Last Year: Never true  Transportation Needs: No Transportation Needs   Lack of Transportation (Medical): No   Lack of Transportation (Non-Medical): No  Physical Activity: Insufficiently Active   Days of Exercise per Week: 6 days   Minutes of Exercise per Session: 20 min  Stress: No Stress Concern Present   Feeling of Stress : Not at all  Social Connections: Moderately Isolated   Frequency of Communication with Friends and Family: More than three times a week   Frequency of Social Gatherings with Friends and Family: Three times a week   Attends Religious Services: More than 4 times per year   Active Member of Clubs or Organizations: No   Attends Archivist Meetings: Never   Marital Status: Widowed    Tobacco Counseling Counseling given: Not Answered   Clinical Intake:  Pre-visit preparation completed: Yes  Pain : No/denies pain     BMI - recorded: 28.28 Nutritional Status: BMI 25 -29 Overweight Nutritional Risks: None Diabetes: No  How often do you need to have someone help you when you read instructions, pamphlets, or other written  materials from your doctor or pharmacy?: 1 - Never  Diabetic? No  Interpreter Needed?: No  Information entered by :: Orrin Brigham LPN   Activities of Daily Living In your present state of health, do you have any difficulty performing the following activities: 01/08/2022  Hearing? N  Vision? N  Difficulty concentrating or making decisions? N  Walking or climbing stairs? N  Dressing or bathing? N  Doing errands, shopping? N  Preparing Food and eating ? N  Using the Toilet? N  In the past six months, have you accidently leaked urine? N  Do you have problems with loss of bowel control? N  Managing your Medications? N  Managing your Finances? N  Housekeeping or managing your Housekeeping? N  Some recent data might be hidden    Patient Care Team: Ria Bush, MD as PCP - General Rutherford Guys, MD as Consulting Physician (Ophthalmology)  Indicate any recent Medical Services you may have received from other than [facility] providers in the past year (date may be approximate).     Assessment:   This is a routine wellness examination for [facility].  Hearing/Vision screen Hearing Screening - Comments:: No issues  Vision Screening - Comments:: Last exam 12/16/21, Dr. Richardean Sale,  Dietary issues and exercise activities discussed: Current Exercise Habits: Home exercise routine, Type of exercise: walking, Time (Minutes): 15, Frequency (Times/Week): 6, Weekly Exercise (Minutes/Week): 90, Intensity: Mild   Goals Addressed             This Visit's Progress    Patient Stated       Would like to lose 10lbs within the year       Depression Screen PHQ 2/9 Scores 01/08/2022 01/07/2021 08/04/2019 03/02/2018 03/01/2017 02/28/2016  PHQ - 2 Score 0 0 0 0 0 0  PHQ- 9 Score - 0 - 0 - -    Fall Risk Fall Risk  01/08/2022 01/07/2021 08/04/2019 03/02/2018 03/01/2017  Falls in the past year? 0 0 0 No No  Number falls in past yr: 0 0 - - -  Injury with Fall? 0 0 - - -  Risk for fall due to : No Fall Risks  Medication side effect;No Fall Risks - - -  Follow up Falls prevention discussed Falls evaluation completed;Falls prevention discussed - - -    FALL RISK PREVENTION PERTAINING TO THE HOME:  Any stairs in or around the home? Yes  If so, are there any without handrails? No  Home free of loose throw rugs in walkways, pet beds, electrical cords, etc? Yes  Adequate lighting in your home to reduce risk of falls? Yes   ASSISTIVE DEVICES UTILIZED TO PREVENT FALLS:  Life alert? No  Use of a cane, walker or w/c? No  Grab bars in the bathroom? Yes  Shower chair or bench in shower? No  Elevated toilet seat or a handicapped toilet? No   TIMED UP AND GO:  Was the test performed? No .    Cognitive Function: Normal cognitive status assessed by  this Nurse Health Advisor. No abnormalities found.   MMSE - Mini Mental State Exam 01/07/2021 03/02/2018 03/01/2017 02/28/2016  Orientation to time 5 5 5 5   Orientation to Place 5 5 5 5   Registration 3 3 3 3   Attention/ Calculation 5 0 0 0  Recall 3 3 3 3   Language- name 2 objects - 0 0 0  Language- repeat 1 1 1 1   Language- follow 3 step command - 3 3 3   Language- read & follow direction - 0 0 0  Write a sentence - 0 0 0  Copy design - 0 0 0  Total score - 20 20 20         Immunizations Immunization History  Administered Date(s) Administered   Fluad Quad(high Dose 65+) 08/04/2019   Influenza Whole 11/02/2007, 11/02/2012   Influenza, High Dose Seasonal PF 10/28/2020   Influenza,inj,Quad PF,6+ Mos 09/14/2013, 09/14/2014, 09/13/2015, 09/11/2016, 09/08/2017, 09/08/2018   PFIZER(Purple Top)SARS-COV-2 Vaccination 12/22/2019, 01/12/2020, 09/29/2020   Pneumococcal Conjugate-13 02/28/2016   Pneumococcal Polysaccharide-23 09/14/2014   Zoster, Live 12/01/2015    TDAP status: Due, Education has been provided regarding the importance of this vaccine. Advised may  receive this vaccine at local pharmacy or Health Dept. Aware to provide a copy of the  vaccination record if obtained from local pharmacy or Health Dept. Verbalized acceptance and understanding.  Flu Vaccine status: Up to date  Pneumococcal vaccine status: Up to date  Covid-19 vaccine status: Information provided on how to obtain vaccines.   Qualifies for Shingles Vaccine? Yes   Zostavax completed Yes   Shingrix Completed?: No.    Education has been provided regarding the importance of this vaccine. Patient has been advised to call insurance company to determine out of pocket expense if they have not yet received this vaccine. Advised may also receive vaccine at local pharmacy or Health Dept. Verbalized acceptance and understanding.  Screening Tests Health Maintenance  Topic Date Due   Zoster Vaccines- Shingrix (1 of 2) Never done   COVID-19 Vaccine (4 - Booster for Pfizer series) 11/24/2020   INFLUENZA VACCINE  06/30/2021   TETANUS/TDAP  01/08/2024 (Originally 09/22/1960)   MAMMOGRAM  11/05/2022   Pneumonia Vaccine 59+ Years old  Completed   DEXA SCAN  Completed   HPV VACCINES  Aged Out    Health Maintenance  Health Maintenance Due  Topic Date Due   Zoster Vaccines- Shingrix (1 of 2) Never done   COVID-19 Vaccine (4 - Booster for Pfizer series) 11/24/2020   INFLUENZA VACCINE  06/30/2021    Colorectal cancer screening: No longer required.   Mammogram status: Completed 11/05/21. Repeat every year  Bone Density status: Completed 10/30/20. Results reflect: Bone density results: OSTEOPENIA. Repeat every 2 years.  Lung Cancer Screening: (Low Dose CT Chest recommended if Age 34-80 years, 30 pack-year currently smoking OR have quit w/in 15years.) does not qualify.     Additional Screening:  Hepatitis C Screening: does not qualify  Vision Screening: Recommended annual ophthalmology exams for early detection of glaucoma and other disorders of the eye. Is the patient up to date with their annual eye exam?  Yes  Who is the provider or what is the name of the office  in which the patient attends annual eye exams? Dr. Gershon Crane  Dental Screening: Recommended annual dental exams for proper oral hygiene  Community Resource Referral / Chronic Care Management: CRR required this visit?  No   CCM required this visit?  No      Plan:     I have personally reviewed and noted the following in the patients chart:   Medical and social history Use of alcohol, tobacco or illicit drugs  Current medications and supplements including opioid prescriptions.  Functional ability and status Nutritional status Physical activity Advanced directives List of other physicians Hospitalizations, surgeries, and ER visits in previous 12 months Vitals Screenings to include cognitive, depression, and falls Referrals and appointments  In addition, I have reviewed and discussed with patient certain preventive protocols, quality metrics, and best practice recommendations. A written personalized care plan for preventive services as well as general preventive health recommendations were provided to patient.   Due to this being a telephonic visit, the after visit summary with patients personalized plan was offered to patient via mail or my-chart.  Patient preferred to pick up at office at next visit.   Loma Messing, LPN   01/29/9517   Nurse Health Advisor  Nurse Notes: Patient plans to provide updated flu vaccine information

## 2022-01-08 ENCOUNTER — Ambulatory Visit (INDEPENDENT_AMBULATORY_CARE_PROVIDER_SITE_OTHER): Payer: Medicare HMO

## 2022-01-08 VITALS — Ht 59.0 in | Wt 140.0 lb

## 2022-01-08 DIAGNOSIS — Z0000 Encounter for general adult medical examination without abnormal findings: Secondary | ICD-10-CM

## 2022-01-08 NOTE — Patient Instructions (Signed)
Joanna White , Thank you for taking time to complete your Medicare Wellness Visit. I appreciate your ongoing commitment to your health goals. Please review the following plan we discussed and let me know if I can assist you in the future.   Screening recommendations/referrals: Colonoscopy: no longer required  Mammogram: up to date, completed 11/05/21, due 11/05/22 Bone Density: up to date, completed 10/30/20, due 10/30/22 Recommended yearly ophthalmology/optometry visit for glaucoma screening and checkup Recommended yearly dental visit for hygiene and checkup  Vaccinations: Influenza vaccine: up to date, please provide updated vaccine information for your chart Pneumococcal vaccine: up to date Tdap vaccine: due, covered by medicare in the event that you are injured Shingles vaccine: May obtain vaccine at your local pharmacy.   Covid-19:newest booster available at your local phramacy  Advanced directives: Please bring a copy of Living Will and/or [place] for your chart.   Conditions/risks identified: see problem list   Next appointment: Follow up in one year for your annual wellness visit 01/11/23 @ 8:15am, this will be a telephone visit   Preventive Care 65 Years and Older, Female Preventive care refers to lifestyle choices and visits with your health care provider that can promote health and wellness. What does preventive care include? A yearly physical exam. This is also called an annual well check. Dental exams once or twice a year. Routine eye exams. Ask your health care provider how often you should have your eyes checked. Personal lifestyle choices, including: Daily care of your teeth and gums. Regular physical activity. Eating a healthy diet. Avoiding tobacco and drug use. Limiting alcohol use. Practicing safe sex. Taking low-dose aspirin every day. Taking vitamin and mineral supplements as recommended by your health care provider. What happens during  an annual well check? The services and screenings done by your health care provider during your annual well check will depend on your age, overall health, lifestyle risk factors, and family history of disease. Counseling  Your health care provider may ask you questions about your: Alcohol use. Tobacco use. Drug use. Emotional well-being. Home and relationship well-being. Sexual activity. Eating habits. History of falls. Memory and ability to understand (cognition). Work and work Statistician. Reproductive health. Screening  You may have the following tests or measurements: Height, weight, and BMI. Blood pressure. Lipid and cholesterol levels. These may be checked every 5 years, or more frequently if you are over 37 years old. Skin check. Lung cancer screening. You may have this screening every year starting at age 8 if you have a 30-pack-year history of smoking and currently smoke or have quit within the past 15 years. Fecal occult blood test (FOBT) of the stool. You may have this test every year starting at age 71. Flexible sigmoidoscopy or colonoscopy. You may have a sigmoidoscopy every 5 years or a colonoscopy every 10 years starting at age 45. Hepatitis C blood test. Hepatitis B blood test. Sexually transmitted disease (STD) testing. Diabetes screening. This is done by checking your blood sugar (glucose) after you have not eaten for a while (fasting). You may have this done every 1-3 years. Bone density scan. This is done to screen for osteoporosis. You may have this done starting at age 2. Mammogram. This may be done every 1-2 years. Talk to your health care provider about how often you should have regular mammograms. Talk with your health care provider about your test results, treatment options, and if necessary, the need for more tests. Vaccines  Your health care provider  may recommend certain vaccines, such as: Influenza vaccine. This is recommended every year. Tetanus,  diphtheria, and acellular pertussis (Tdap, Td) vaccine. You may need a Td booster every 10 years. Zoster vaccine. You may need this after age 80. Pneumococcal 13-valent conjugate (PCV13) vaccine. One dose is recommended after age 11. Pneumococcal polysaccharide (PPSV23) vaccine. One dose is recommended after age 63. Talk to your health care provider about which screenings and vaccines you need and how often you need them. This information is not intended to replace advice given to you by your health care provider. Make sure you discuss any questions you have with your health care provider. Document Released: 12/13/2015 Document Revised: 08/05/2016 Document Reviewed: 09/17/2015 Elsevier Interactive Patient Education  2017 [place] Prevention in the Home Falls can cause injuries. They can happen to people of all ages. There are many things you can do to make your home safe and to help prevent falls. What can I do on the outside of my home? Regularly fix the edges of walkways and driveways and fix any cracks. Remove anything that might make you trip as you walk through a door, such as a raised step or threshold. Trim any bushes or trees on the path to your home. Use bright outdoor lighting. Clear any walking paths of anything that might make someone trip, such as rocks or tools. Regularly check to see if handrails are loose or broken. Make sure that both sides of any steps have handrails. Any raised decks and porches should have guardrails on the edges. Have any leaves, snow, or ice cleared regularly. Use sand or salt on walking paths during winter. Clean up any spills in your garage right away. This includes oil or grease spills. What can I do in the bathroom? Use night lights. Install grab bars by the toilet and in the tub and shower. Do not use towel bars as grab bars. Use non-skid mats or decals in the tub or shower. If you need to sit down in the shower, use a plastic,  non-slip stool. Keep the floor dry. Clean up any water that spills on the floor as soon as it happens. Remove soap buildup in the tub or shower regularly. Attach bath mats securely with double-sided non-slip rug tape. Do not have throw rugs and other things on the floor that can make you trip. What can I do in the bedroom? Use night lights. Make sure that you have a light by your bed that is easy to reach. Do not use any sheets or blankets that are too big for your bed. They should not hang down onto the floor. Have a firm chair that has side arms. You can use this for support while you get dressed. Do not have throw rugs and other things on the floor that can make you trip. What can I do in the kitchen? Clean up any spills right away. Avoid walking on wet floors. Keep items that you use a lot in easy-to-reach places. If you need to reach something above you, use a strong step stool that has a grab bar. Keep electrical cords out of the way. Do not use floor polish or wax that makes floors slippery. If you must use wax, use non-skid floor wax. Do not have throw rugs and other things on the floor that can make you trip. What can I do with my stairs? Do not leave any items on the stairs. Make sure that there are handrails on both sides  of the stairs and use them. Fix handrails that are broken or loose. Make sure that handrails are as long as the stairways. Check any carpeting to make sure that it is firmly attached to the stairs. Fix any carpet that is loose or worn. Avoid having throw rugs at the top or bottom of the stairs. If you do have throw rugs, attach them to the floor with carpet tape. Make sure that you have a light switch at the top of the stairs and the bottom of the stairs. If you do not have them, ask someone to add them for you. What else can I do to help prevent falls? Wear shoes that: Do not have high heels. Have rubber bottoms. Are comfortable and fit you well. Are closed  at the toe. Do not wear sandals. If you use a stepladder: Make sure that it is fully opened. Do not climb a closed stepladder. Make sure that both sides of the stepladder are locked into place. Ask someone to hold it for you, if possible. Clearly mark and make sure that you can see: Any grab bars or handrails. First and last steps. Where the edge of each step is. Use tools that help you move around (mobility aids) if they are needed. These include: Canes. Walkers. Scooters. Crutches. Turn on the lights when you go into a dark area. Replace any light bulbs as soon as they burn out. Set up your furniture so you have a clear path. Avoid moving your furniture around. If any of your floors are uneven, fix them. If there are any pets around you, be aware of where they are. Review your medicines with your doctor. Some medicines can make you feel dizzy. This can increase your chance of falling. Ask your doctor what other things that you can do to help prevent falls. This information is not intended to replace advice given to you by your health care provider. Make sure you discuss any questions you have with your health care provider. Document Released: 09/12/2009 Document Revised: 04/23/2016 Document Reviewed: 12/21/2014 Elsevier Interactive Patient Education  2017 [employer].

## 2022-01-09 ENCOUNTER — Other Ambulatory Visit: Payer: Self-pay | Admitting: Family Medicine

## 2022-01-12 ENCOUNTER — Other Ambulatory Visit: Payer: Self-pay

## 2022-01-12 ENCOUNTER — Other Ambulatory Visit (INDEPENDENT_AMBULATORY_CARE_PROVIDER_SITE_OTHER): Payer: Medicare HMO

## 2022-01-12 DIAGNOSIS — E785 Hyperlipidemia, unspecified: Secondary | ICD-10-CM | POA: Diagnosis not present

## 2022-01-12 DIAGNOSIS — E039 Hypothyroidism, unspecified: Secondary | ICD-10-CM | POA: Diagnosis not present

## 2022-01-12 DIAGNOSIS — M85852 Other specified disorders of bone density and structure, left thigh: Secondary | ICD-10-CM

## 2022-01-12 LAB — TSH: TSH: 0.52 u[IU]/mL (ref 0.35–5.50)

## 2022-01-12 LAB — VITAMIN D 25 HYDROXY (VIT D DEFICIENCY, FRACTURES): VITD: 44.69 ng/mL (ref 30.00–100.00)

## 2022-01-12 NOTE — Telephone Encounter (Signed)
E-scribed refill.  Had [facility] wellness on 01/08/22 and labs today.  Plz schedule CPE asap.

## 2022-01-13 LAB — COMPREHENSIVE METABOLIC PANEL
ALT: 11 U/L (ref 0–35)
AST: 15 U/L (ref 0–37)
Albumin: 4.3 g/dL (ref 3.5–5.2)
Alkaline Phosphatase: 43 U/L (ref 39–117)
BUN: 14 mg/dL (ref 6–23)
CO2: 30 mEq/L (ref 19–32)
Calcium: 9.6 mg/dL (ref 8.4–10.5)
Chloride: 106 mEq/L (ref 96–112)
Creatinine, Ser: 0.83 mg/dL (ref 0.40–1.20)
GFR: 66.17 mL/min (ref >60.00)
Glucose, Bld: 97 mg/dL (ref 70–99)
Potassium: 4.8 mEq/L (ref 3.5–5.1)
Sodium: 142 mEq/L (ref 135–145)
Total Bilirubin: 0.6 mg/dL (ref 0.2–1.2)
Total Protein: 6.9 g/dL (ref 6.0–8.3)

## 2022-01-13 LAB — LIPID PANEL
Cholesterol: 257 mg/dL — ABNORMAL HIGH (ref 0–200)
HDL: 46.2 mg/dL (ref >39.00)
NonHDL: 210.67
Total CHOL/HDL Ratio: 6
Triglycerides: 202 mg/dL — ABNORMAL HIGH (ref 0.0–149.0)
VLDL: 40.4 mg/dL — ABNORMAL HIGH (ref 0.0–40.0)

## 2022-01-13 LAB — LDL CHOLESTEROL, DIRECT: Direct LDL: 188 mg/dL

## 2022-01-13 NOTE — Telephone Encounter (Signed)
Pt has been scheduled for 3/20

## 2022-01-19 ENCOUNTER — Encounter: Payer: No Typology Code available for payment source | Admitting: Family Medicine

## 2022-01-19 ENCOUNTER — Telehealth: Payer: Self-pay | Admitting: *Deleted

## 2022-01-19 NOTE — Telephone Encounter (Signed)
PLEASE NOTE: All timestamps contained within this report are represented as [place] Standard Time. CONFIDENTIALTY NOTICE: This fax transmission is intended only for the addressee. It contains information that is legally privileged, confidential or otherwise protected from use or disclosure. If you are not the intended recipient, you are strictly prohibited from reviewing, disclosing, copying using or disseminating any of this information or taking any action in reliance on or regarding this information. If you have received this fax in error, please notify us immediately by telephone so that we can arrange for its return to [place]. Phone: [phone], Toll-Free: [phone], Fax: [phone] Page: 1 of 1 Call Id: [id number] [place] [facility] - Client Nonclinical Telephone Record  [facility] Client [place] [facility] - Client Client Site [place] Provider Ria Bush - MD Contact Type Call Who Is Calling Patient / Member / Family / Caregiver Caller Name Adalia Pettis Caller Phone Number [phone] Patient Name Mayrene Bastarache Patient DOB 04-Aug-1941 Call Type Message Only Information Provided Reason for Call Request for General Office Information Initial Comment Caller asking if the office can call and confirm her appt date and time Disp. Time Disposition Final User 01/17/2022 8:45:43 AM General Information Provided Yes Kenton Kingfisher, Lanette Call Closed By: Nelia Shi Transaction Date/Time: 01/17/2022 8:43:26 AM (ET)

## 2022-01-23 NOTE — Telephone Encounter (Signed)
LMTCB to follow up on appt

## 2022-02-16 ENCOUNTER — Encounter: Payer: Self-pay | Admitting: Family Medicine

## 2022-02-16 ENCOUNTER — Other Ambulatory Visit: Payer: Self-pay

## 2022-02-16 ENCOUNTER — Ambulatory Visit (INDEPENDENT_AMBULATORY_CARE_PROVIDER_SITE_OTHER): Payer: Medicare HMO | Admitting: Family Medicine

## 2022-02-16 VITALS — BP 146/82 | HR 101 | Temp 97.8°F | Ht 60.0 in | Wt 145.1 lb

## 2022-02-16 DIAGNOSIS — E785 Hyperlipidemia, unspecified: Secondary | ICD-10-CM | POA: Diagnosis not present

## 2022-02-16 DIAGNOSIS — H4010X0 Unspecified open-angle glaucoma, stage unspecified: Secondary | ICD-10-CM | POA: Diagnosis not present

## 2022-02-16 DIAGNOSIS — Z853 Personal history of malignant neoplasm of breast: Secondary | ICD-10-CM

## 2022-02-16 DIAGNOSIS — E039 Hypothyroidism, unspecified: Secondary | ICD-10-CM | POA: Diagnosis not present

## 2022-02-16 DIAGNOSIS — M85852 Other specified disorders of bone density and structure, left thigh: Secondary | ICD-10-CM | POA: Diagnosis not present

## 2022-02-16 DIAGNOSIS — Z7189 Other specified counseling: Secondary | ICD-10-CM

## 2022-02-16 DIAGNOSIS — R030 Elevated blood-pressure reading, without diagnosis of hypertension: Secondary | ICD-10-CM | POA: Insufficient documentation

## 2022-02-16 DIAGNOSIS — Z0000 Encounter for general adult medical examination without abnormal findings: Secondary | ICD-10-CM | POA: Diagnosis not present

## 2022-02-16 MED ORDER — LEVOTHYROXINE SODIUM 75 MCG PO TABS: 75.0000 ug | ORAL_TABLET | Freq: Every day | ORAL | 3 refills | Status: DC

## 2022-02-16 NOTE — Assessment & Plan Note (Signed)
Chronic, stable on current regimen - continue. 

## 2022-02-16 NOTE — Assessment & Plan Note (Signed)
Preventative protocols reviewed and updated unless pt declined. Discussed healthy diet and lifestyle.  

## 2022-02-16 NOTE — Assessment & Plan Note (Signed)
Appreciate ophtho care.  

## 2022-02-16 NOTE — Patient Instructions (Addendum)
Caffeine may contribute to elevated blood pressure and heart rate today - try to limit caffeinated beverages to 2-3 a day.  ?Keep an eye on blood pressures at home, let us know if consistently >150/90.  ?Increase fiber and legumes in the diet to help lower cholesterol levels.  ?You are doing well today  ?Return as needed or in 1 year for next physical. ? ?Health Maintenance After Age 82 ?After age 65, you are at a higher risk for certain long-term diseases and infections as well as injuries from falls. Falls are a major cause of broken bones and head injuries in people who are older than age 11. Getting regular preventive care can help to keep you healthy and well. Preventive care includes getting regular testing and making lifestyle changes as recommended by your health care provider. Talk with your health care provider about: ?Which screenings and tests you should have. A screening is a test that checks for a disease when you have no symptoms. ?A diet and exercise plan that is right for you. ?What should I know about screenings and tests to prevent falls? ?Screening and testing are the best ways to find a health problem early. Early diagnosis and treatment give you the best chance of managing medical conditions that are common after age 64. Certain conditions and lifestyle choices may make you more likely to have a fall. Your health care provider may recommend: ?Regular vision checks. Poor vision and conditions such as cataracts can make you more likely to have a fall. If you wear glasses, make sure to get your prescription updated if your vision changes. ?Medicine review. Work with your health care provider to regularly review all of the medicines you are taking, including over-the-counter medicines. Ask your health care provider about any side effects that may make you more likely to have a fall. Tell your health care provider if any medicines that you take make you feel dizzy or sleepy. ?Strength and balance  checks. Your health care provider may recommend certain tests to check your strength and balance while standing, walking, or changing positions. ?Foot health exam. Foot pain and numbness, as well as not wearing proper footwear, can make you more likely to have a fall. ?Screenings, including: ?Osteoporosis screening. Osteoporosis is a condition that causes the bones to get weaker and break more easily. ?Blood pressure screening. Blood pressure changes and medicines to control blood pressure can make you feel dizzy. ?Depression screening. You may be more likely to have a fall if you have a fear of falling, feel depressed, or feel unable to do activities that you used to do. ?Alcohol use screening. Using too much alcohol can affect your balance and may make you more likely to have a fall. ?Follow these instructions at home: ?Lifestyle ?Do not drink alcohol if: ?Your health care provider tells you not to drink. ?If you drink alcohol: ?Limit how much you have to: ?0-1 drink a day for women. ?0-2 drinks a day for men. ?Know how much alcohol is in your drink. In the [place] one drink equals one 12 oz bottle of beer (355 mL), one 5 oz glass of wine (148 mL), or one 1? oz glass of hard liquor (44 mL). ?Do not use any products that contain nicotine or tobacco. These products include cigarettes, chewing tobacco, and vaping devices, such as e-cigarettes. If you need help quitting, ask your health care provider. ?Activity ? ?Follow a regular exercise program to stay fit. This will help you maintain your  balance. Ask your health care provider what types of exercise are appropriate for you. ?If you need a cane or walker, use it as recommended by your health care provider. ?Wear supportive shoes that have nonskid soles. ?Safety ? ?Remove any tripping hazards, such as rugs, cords, and clutter. ?Install safety equipment such as grab bars in bathrooms and safety rails on stairs. ?Keep rooms and walkways well-lit. ?General  instructions ?Talk with your health care provider about your risks for falling. Tell your health care provider if: ?You fall. Be sure to tell your health care provider about all falls, even ones that seem minor. ?You feel dizzy, tiredness (fatigue), or off-balance. ?Take over-the-counter and prescription medicines only as told by your health care provider. These include supplements. ?Eat a healthy diet and maintain a healthy weight. A healthy diet includes low-fat dairy products, low-fat (lean) meats, and fiber from whole grains, beans, and lots of fruits and vegetables. ?Stay current with your vaccines. ?Schedule regular health, dental, and eye exams. ?Summary ?Having a healthy lifestyle and getting preventive care can help to protect your health and wellness after age 89. ?Screening and testing are the best way to find a health problem early and help you avoid having a fall. Early diagnosis and treatment give you the best chance for managing medical conditions that are more common for people who are older than age 42. ?Falls are a major cause of broken bones and head injuries in people who are older than age 59. Take precautions to prevent a fall at home. ?Work with your health care provider to learn what changes you can make to improve your health and wellness and to prevent falls. ?This information is not intended to replace advice given to you by your health care provider. Make sure you discuss any questions you have with your health care provider. ?Document Revised: 04/07/2021 Document Reviewed: 04/07/2021 ?Elsevier Patient Education ? [place]. ? ?

## 2022-02-16 NOTE — Progress Notes (Signed)
? ? Patient ID: Joanna White, female    DOB: 1940/12/31, 81 y.o.   MRN: [id number] ? ?This visit was conducted in person. ? ?BP (!) 146/82 (BP Location: Right Arm, Cuff Size: Normal)   Pulse (!) 101   Temp 97.8 ?F (36.6 ?C) (Temporal)   Ht 5' (1.524 m)   Wt 145 lb 2 oz (65.8 kg)   SpO2 97%   BMI 28.34 kg/m?   ?On recheck 160/90 ? ?CC: CPE ?Subjective:  ? ?HPI: ?Joanna White is a 81 y.o. female presenting on 02/16/2022 for Annual Exam [facility]. ) ? ? ?Saw health advisor last month for medicare wellness visit. Note reviewed.   ? ?No results found.  ?Flowsheet Row Clinical Support from 01/08/2022 in [facility] [place]  ?PHQ-2 Total Score 0  ? ?  ?  ?Fall Risk  01/08/2022 01/07/2021 08/04/2019 03/02/2018 03/01/2017  ?Falls in the past year? 0 0 0 No No  ?Number falls in past yr: 0 0 - - -  ?Injury with Fall? 0 0 - - -  ?Risk for fall due to : No Fall Risks Medication side effect;No Fall Risks - - -  ?Follow up Falls prevention discussed Falls evaluation completed;Falls prevention discussed - - -  ? 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  ? ?Preventative: ?Colon cancer screening - cologuard 02/2016 and 08/2019 WNL. Aged out.  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 Mammo Birads-0 10/2021 - rec further imaging - pt states normal on repeat testing (dense breasts). Sees onc yearly.  ?Well woman exam - aged out.  ?DEXA 10/2015 osteopenia, T -1.1 spine, hip -1.7, DEXA T -1.5 hip, -0.2 spine (improvement) 03/2018.  ?DEXA 10/2020 - T -2.2 L femur neck, -0.4 spine. Drinks 1 gallon milk/wk. she walks regularly.  ?Lung cancer screening - never smoker  ?Flu shot yearly  ?[place] 12/2019, 01/2020, booster 10-06-20  ?Tetanus shot - unsure  ?Pneumovax 2015, Prevnar-13 2017 ?Zostavax - 2017 ?Shingrix - discussed, declines.  ?Advanced directive discussion - does not have set up but has talked to  children who are aware of her wishes. Does not want prolonged life support if terminal condition, would be ok with temporary measures. Does not want to be cremated. Brother had prolonged illness in acute care facility and passed away October 06, 2020. HCPOA would be 2 children. Packet previously provided.  ?Seat belt use discussed  ?Sunscreen use discussed, no changing moles on skin ?Non smoker  ?Alcohol - none  ?Dentist q6 mo  ?Eye exam seen q3 mo for glaucoma Gershon Crane) - very well controlled  ?Bowel - no diarrhea/constipation  ?Bladder - no incontinence  ?  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   ? ?Relevant past medical, surgical, family and social history reviewed and updated as indicated. Interim medical history since our last visit reviewed. ?Allergies and medications reviewed and updated. ?Outpatient Medications Prior to Visit  ?Medication Sig Dispense Refill  ? Cholecalciferol (VITAMIN D3) 1000 units CAPS Take 1 capsule (1,000 Units total) by mouth daily. 30 capsule   ? FLUZONE HIGH-DOSE QUADRIVALENT 0.7 ML SUSY     ? latanoprost (XALATAN) 0.005 % ophthalmic solution Place into both eyes  at bedtime.     ? Multiple Vitamin (MULTIVITAMIN) tablet Take 1 tablet by mouth daily. With calcium    ? OIL OF OREGANO PO Take by mouth.    ? levothyroxine (SYNTHROID) 75 MCG tablet TAKE 1 TABLET BY MOUTH DAILY BEFORE BREAKFAST. 90 tablet 0  ? ?No facility-administered medications prior to visit.  ?  ? ?Per HPI unless specifically indicated in ROS section below ?Review of Systems  ?Constitutional:  Negative for activity change, appetite change, chills, fatigue, fever and unexpected weight change.  ?HENT:  Negative for hearing loss.   ?Eyes:  Negative for visual disturbance.  ?Respiratory:  Negative for cough, chest  tightness, shortness of breath and wheezing.   ?Cardiovascular:  Negative for chest pain, palpitations and leg swelling.  ?Gastrointestinal:  Negative for abdominal distention, abdominal pain, blood in stool, constipation, diarrhea, nausea and vomiting.  ?Genitourinary:  Negative for difficulty urinating and hematuria.  ?Musculoskeletal:  Negative for arthralgias, myalgias and neck pain.  ?Skin:  Negative for rash.  ?Neurological:  Negative for dizziness, seizures, syncope and headaches.  ?Hematological:  Negative for adenopathy. Does not bruise/bleed easily.  ?Psychiatric/Behavioral:  Negative for dysphoric mood. The patient is not nervous/anxious.   ? ?Objective:  ?BP (!) 146/82 (BP Location: Right Arm, Cuff Size: Normal)   Pulse (!) 101   Temp 97.8 ?F (36.6 ?C) (Temporal)   Ht 5' (1.524 m)   Wt 145 lb 2 oz (65.8 kg)   SpO2 97%   BMI 28.34 kg/m?   ?Wt Readings from Last 3 Encounters:  ?02/16/22 145 lb 2 oz (65.8 kg)  ?01/08/22 140 lb (63.5 kg)  ?10/22/21 144 lb (65.3 kg)  ?  ?  ?Physical Exam ?Vitals and nursing note reviewed.  ?Constitutional:   ?   Appearance: Normal appearance. She is not ill-appearing.  ?HENT:  ?   Head: Normocephalic and atraumatic.  ?   Right Ear: Tympanic membrane, ear canal and external ear normal. There is no impacted cerumen.  ?   Left Ear: Tympanic membrane, ear canal and external ear normal. There is no impacted cerumen.  ?Eyes:  ?   General:     ?   Right eye: No discharge.     ?   Left eye: No discharge.  ?   Extraocular Movements: Extraocular movements intact.  ?   Conjunctiva/sclera: Conjunctivae normal.  ?   Pupils: Pupils are equal, round, and reactive to light.  ?Neck:  ?   Thyroid: No thyroid mass or thyromegaly.  ?   Vascular: No carotid bruit.  ?Cardiovascular:  ?   Rate and Rhythm: Normal rate and regular rhythm.  ?   Pulses: Normal pulses.  ?   Heart sounds: Normal heart sounds. No murmur heard. ?Pulmonary:  ?   Effort: Pulmonary effort is normal. No respiratory  distress.  ?   Breath sounds: Normal breath sounds. No wheezing, rhonchi or rales.  ?Abdominal:  ?   General: Bowel sounds are normal. There is no distension.  ?   Palpations: Abdomen is soft. There is no mass.  ?   Tenderness: There is no abdominal tenderness. There is no guarding or rebound.  ?   Hernia: No hernia is present.  ?Musculoskeletal:  ?   Cervical back: Normal range of motion and neck supple. No rigidity.  ?   Right lower leg: No edema.  ?   Left lower leg: No edema.  ?Lymphadenopathy:  ?   Cervical: No cervical adenopathy.  ?Skin: ?  General: Skin is warm and dry.  ?   Findings: No rash.  ?Neurological:  ?   General: No focal deficit present.  ?   Mental Status: She is alert. Mental status is at baseline.  ?Psychiatric:     ?   Mood and Affect: Mood normal.     ?   Behavior: Behavior normal.  ? ?   ?Results for orders placed or performed in visit on 01/12/22  ?TSH  ?Result Value Ref Range  ? TSH 0.52 0.35 - 5.50 uIU/mL  ?Comprehensive metabolic panel  ?Result Value Ref Range  ? Sodium 142 135 - 145 mEq/L  ? Potassium 4.8 3.5 - 5.1 mEq/L  ? Chloride 106 96 - 112 mEq/L  ? CO2 30 19 - 32 mEq/L  ? Glucose, Bld 97 70 - 99 mg/dL  ? BUN 14 6 - 23 mg/dL  ? Creatinine, Ser 0.83 0.40 - 1.20 mg/dL  ? Total Bilirubin 0.6 0.2 - 1.2 mg/dL  ? Alkaline Phosphatase 43 39 - 117 U/L  ? AST 15 0 - 37 U/L  ? ALT 11 0 - 35 U/L  ? Total Protein 6.9 6.0 - 8.3 g/dL  ? Albumin 4.3 3.5 - 5.2 g/dL  ? GFR 66.17 >60.00 mL/min  ? Calcium 9.6 8.4 - 10.5 mg/dL  ?Lipid panel  ?Result Value Ref Range  ? Cholesterol 257 (H) 0 - 200 mg/dL  ? Triglycerides 202.0 (H) 0.0 - 149.0 mg/dL  ? HDL 46.20 >39.00 mg/dL  ? VLDL 40.4 (H) 0.0 - 40.0 mg/dL  ? Total CHOL/HDL Ratio 6   ? NonHDL 210.67   ?VITAMIN D 25 Hydroxy (Vit-D Deficiency, Fractures)  ?Result Value Ref Range  ? VITD 44.69 30.00 - 100.00 ng/mL  ?LDL cholesterol, direct  ?Result Value Ref Range  ? Direct LDL 188.0 mg/dL  ? ? ?Assessment & Plan:  ?This visit occurred during the  SARS-CoV-2 public health emergency.  Safety protocols were in place, including screening questions prior to the visit, additional usage of staff PPE, and extensive cleaning of exam room while observing appropriate contact tim

## 2022-02-16 NOTE — Assessment & Plan Note (Addendum)
BP tends to run elevated when in the office, but she states home readings largely well controlled. I did ask her to continue monitoring BP closely and let me know if consistently >150/90 at home to discuss BP medications. Discussed relation of caffeine to elevated BP and heart rate.  ?

## 2022-02-16 NOTE — Assessment & Plan Note (Addendum)
Advanced directive discussion - does not have set up but has talked to children who are aware of her wishes. Does not want prolonged life support if terminal condition, would be ok with temporary measures. Does not want to be cremated. Brother had prolonged illness in acute care facility and passed away 10/12/2020. HCPOA would be 2 children. Packet previously provided.  ?

## 2022-02-16 NOTE — Assessment & Plan Note (Signed)
Appreciate onc care.  

## 2022-02-16 NOTE — Assessment & Plan Note (Addendum)
Chronic, discussed cholesterol levels off medication. She is not interested in statin. Reviewed healthy diet choices to improve cholesterol control. Given age, reasonable to stay off statin.  ?The ASCVD Risk score (Arnett DK, et al., 2019) failed to calculate for the following reasons: ?  The 2019 ASCVD risk score is only valid for ages 26 to 25  ?

## 2022-02-16 NOTE — Assessment & Plan Note (Signed)
Continue vit D and good calcium intake in diet. Continue regular weight bearing exercise. ?

## 2022-06-10 DIAGNOSIS — H401132 Primary open-angle glaucoma, bilateral, moderate stage: Secondary | ICD-10-CM | POA: Diagnosis not present

## 2022-06-12 NOTE — Telephone Encounter (Signed)
error 

## 2022-07-28 DIAGNOSIS — R208 Other disturbances of skin sensation: Secondary | ICD-10-CM | POA: Diagnosis not present

## 2022-07-28 DIAGNOSIS — M792 Neuralgia and neuritis, unspecified: Secondary | ICD-10-CM | POA: Diagnosis not present

## 2022-07-28 DIAGNOSIS — E039 Hypothyroidism, unspecified: Secondary | ICD-10-CM | POA: Diagnosis not present

## 2022-08-14 DIAGNOSIS — M79602 Pain in left arm: Secondary | ICD-10-CM | POA: Diagnosis not present

## 2022-08-14 DIAGNOSIS — I6523 Occlusion and stenosis of bilateral carotid arteries: Secondary | ICD-10-CM | POA: Diagnosis not present

## 2022-08-14 DIAGNOSIS — M79605 Pain in left leg: Secondary | ICD-10-CM | POA: Diagnosis not present

## 2022-08-18 ENCOUNTER — Other Ambulatory Visit (HOSPITAL_COMMUNITY): Payer: Self-pay | Admitting: Orthopedic Surgery

## 2022-08-18 ENCOUNTER — Ambulatory Visit (HOSPITAL_COMMUNITY)
Admission: RE | Admit: 2022-08-18 | Discharge: 2022-08-18 | Disposition: A | Discharge status: Home / Self Care | Payer: Medicare HMO | Source: Ambulatory Visit | Attending: Cardiology | Admitting: Cardiology

## 2022-08-18 DIAGNOSIS — R200 Anesthesia of skin: Secondary | ICD-10-CM | POA: Diagnosis not present

## 2022-08-25 ENCOUNTER — Inpatient Hospital Stay (HOSPITAL_COMMUNITY): Admission: RE | Admit: 2022-08-25 | Payer: Medicare HMO | Source: Ambulatory Visit

## 2022-08-25 ENCOUNTER — Encounter (HOSPITAL_COMMUNITY): Payer: Self-pay

## 2022-09-07 ENCOUNTER — Telehealth: Payer: Self-pay | Admitting: Family Medicine

## 2022-09-07 NOTE — Telephone Encounter (Signed)
Spoke to patient by telephone and was advised that she is still having numbness on the left side of her body. Patient stated the symptoms started 07/26/22. Patient stated that the numbness is from her left elbow down into her hand. Patient stated that she also has numbness from left knee down to her foot. Patient stated that it does not prevent her from using the hand or foot. Patient stated that she just feels like the areas are asleep. Patient was offered an appointment Wednesday 09/09/22 which she declined stating that she has plans with some friends. Patient stated the orthopedist told her if she did not get better in 3-4 months to come back. Patient scheduled an appointment with Dr. Danise Mina 09/15/22 at 2:00 pm. Patient was given ER precautions and she verbalized understanding.

## 2022-09-07 NOTE — Telephone Encounter (Signed)
Received notice that patient was seen last month by [facility] and ortho for left sided weakness.  Carotid US WNL.  Can we call pt for update on symptoms? Would also offer OV for further evaluation.

## 2022-09-15 ENCOUNTER — Encounter: Payer: Self-pay | Admitting: Family Medicine

## 2022-09-15 ENCOUNTER — Ambulatory Visit (INDEPENDENT_AMBULATORY_CARE_PROVIDER_SITE_OTHER): Payer: Medicare HMO | Admitting: Family Medicine

## 2022-09-15 VITALS — BP 154/84 | HR 94 | Temp 97.7°F | Ht 60.0 in | Wt 144.5 lb

## 2022-09-15 DIAGNOSIS — R030 Elevated blood-pressure reading, without diagnosis of hypertension: Secondary | ICD-10-CM

## 2022-09-15 DIAGNOSIS — Z23 Encounter for immunization: Secondary | ICD-10-CM

## 2022-09-15 DIAGNOSIS — R200 Anesthesia of skin: Secondary | ICD-10-CM

## 2022-09-15 DIAGNOSIS — E785 Hyperlipidemia, unspecified: Secondary | ICD-10-CM | POA: Diagnosis not present

## 2022-09-15 MED ORDER — ASPIRIN 81 MG PO TBEC: 81.0000 mg | DELAYED_RELEASE_TABLET | Freq: Every day | ORAL | Status: DC

## 2022-09-15 NOTE — Patient Instructions (Addendum)
Flu shot today  Restart aspirin '81mg'$  daily.  I'd like to check head CT for further evaluation.  You may call [phone] to schedule CT scan.  Good to see you today.

## 2022-09-15 NOTE — Progress Notes (Signed)
Patient ID: Joanna White, female    DOB: 1941/11/16, 81 y.o.   MRN: [id number]  This visit was conducted in person.  BP (!) 154/84   Pulse 94   Temp 97.7 F (36.5 C) (Temporal)   Ht 5' (1.524 m)   Wt 144 lb 8 oz (65.5 kg)   SpO2 95%   BMI 28.22 kg/m   On repeat testing, 164/82 Before leaving home today 131/64, pulse 97 BP Readings from Last 3 Encounters:  09/15/22 (!) 154/84  02/16/22 (!) 146/82  10/22/21 (!) 151/79   CC: numbness to left side Subjective:   HPI: Joanna White is a 81 y.o. female presenting on 09/15/2022 for Numbness (C/o ongoing numbness/weakness on L side.  Started 07/26/22. Sxs have slightly improved. )   Almost 2 month history of left sided numbness that suddenly started while sitting on her bar at home. L leg was hanging down off barstool and fell asleep from waist down foot. Then noticed L arm was also asleep from shoulder down to hand.   Overall felt well - no fevers/chills, headache, nausea dizziness, dyspnea or chest pain, blurry vision, double vision, palpitations, slurred speech.  Denies inciting trauma/injury orfall.  Saw [facility] - B12 471, folate >20, CBC WNL, CMP WNL,TSH 0.49. treated with prednisone taper.  Saw ortho Dr Gladstone Lighter, started on aspirin '81mg'$  BID until carotid US which returned normal (08/18/2022). She is now off aspirin.   Symptoms have improved - now having paresthesias from left lateral elbow to 4th/5th digit, and from left lateral knee down to foot/toes.      Relevant past medical, surgical, family and social history reviewed and updated as indicated. Interim medical history since our last visit reviewed. Allergies and medications reviewed and updated. Outpatient Medications Prior to Visit  Medication Sig Dispense Refill   Cholecalciferol (VITAMIN D3) 1000 units CAPS Take 1 capsule (1,000 Units total) by mouth daily. 30 capsule    latanoprost (XALATAN) 0.005 % ophthalmic solution Place into both eyes at bedtime.       levothyroxine (SYNTHROID) 75 MCG tablet Take 1 tablet (75 mcg total) by mouth daily before breakfast. 90 tablet 3   Multiple Vitamin (MULTIVITAMIN) tablet Take 1 tablet by mouth daily. With calcium     OIL OF OREGANO PO Take by mouth.     FLUZONE HIGH-DOSE QUADRIVALENT 0.7 ML SUSY      No facility-administered medications prior to visit.     Per HPI unless specifically indicated in ROS section below Review of Systems  Objective:  BP (!) 154/84   Pulse 94   Temp 97.7 F (36.5 C) (Temporal)   Ht 5' (1.524 m)   Wt 144 lb 8 oz (65.5 kg)   SpO2 95%   BMI 28.22 kg/m   Wt Readings from Last 3 Encounters:  09/15/22 144 lb 8 oz (65.5 kg)  02/16/22 145 lb 2 oz (65.8 kg)  01/08/22 140 lb (63.5 kg)      Physical Exam Vitals and nursing note reviewed.  Constitutional:      Appearance: Normal appearance. She is not ill-appearing.  HENT:     Mouth/Throat:     Mouth: Mucous membranes are moist.     Pharynx: Oropharynx is clear. No oropharyngeal exudate or posterior oropharyngeal erythema.  Eyes:     Extraocular Movements: Extraocular movements intact.     Conjunctiva/sclera: Conjunctivae normal.     Pupils: Pupils are equal, round, and reactive to light.  Neck:     Vascular: No  carotid bruit.  Cardiovascular:     Rate and Rhythm: Normal rate and regular rhythm.     Pulses: Normal pulses.     Heart sounds: Normal heart sounds. No murmur heard. Pulmonary:     Effort: Pulmonary effort is normal. No respiratory distress.     Breath sounds: Normal breath sounds. No wheezing, rhonchi or rales.  Musculoskeletal:     Cervical back: Normal range of motion and neck supple. No rigidity.     Right lower leg: No edema.     Left lower leg: No edema.  Lymphadenopathy:     Cervical: No cervical adenopathy.  Skin:    General: Skin is warm and dry.     Findings: No rash.  Neurological:     Mental Status: She is alert.     Cranial Nerves: Cranial nerves 2-12 are intact.     Sensory: Sensory  deficit present.     Motor: Motor function is intact.     Coordination: Coordination is intact.     Comments:  CN 2-12 intact FTN intact EOMI 5/5 strength BUE, BLE Sensation intact BUE, BLE Tingling to left ulnar hand and lateral elbows as well as left foot  Psychiatric:        Mood and Affect: Mood normal.        Behavior: Behavior normal.       Results for orders placed or performed in visit on 01/12/22  TSH  Result Value Ref Range   TSH 0.52 0.35 - 5.50 uIU/mL  Comprehensive metabolic panel  Result Value Ref Range   Sodium 142 135 - 145 mEq/L   Potassium 4.8 3.5 - 5.1 mEq/L   Chloride 106 96 - 112 mEq/L   CO2 30 19 - 32 mEq/L   Glucose, Bld 97 70 - 99 mg/dL   BUN 14 6 - 23 mg/dL   Creatinine, Ser 0.83 0.40 - 1.20 mg/dL   Total Bilirubin 0.6 0.2 - 1.2 mg/dL   Alkaline Phosphatase 43 39 - 117 U/L   AST 15 0 - 37 U/L   ALT 11 0 - 35 U/L   Total Protein 6.9 6.0 - 8.3 g/dL   Albumin 4.3 3.5 - 5.2 g/dL   GFR 66.17 >60.00 mL/min   Calcium 9.6 8.4 - 10.5 mg/dL  Lipid panel  Result Value Ref Range   Cholesterol 257 (H) 0 - 200 mg/dL   Triglycerides 202.0 (H) 0.0 - 149.0 mg/dL   HDL 46.20 >39.00 mg/dL   VLDL 40.4 (H) 0.0 - 40.0 mg/dL   Total CHOL/HDL Ratio 6    NonHDL 210.67   VITAMIN D 25 Hydroxy (Vit-D Deficiency, Fractures)  Result Value Ref Range   VITD 44.69 30.00 - 100.00 ng/mL  LDL cholesterol, direct  Result Value Ref Range   Direct LDL 188.0 mg/dL    Assessment & Plan:   Problem List Items Addressed This Visit     Dyslipidemia    Declines statin given son's bad experience on this family of medications.       Elevated blood pressure reading in office without diagnosis of hypertension    BP remains elevated however home readings largely well controlled. Has component of white coat hypertension.       Numbness on left side - Primary    Persistent numbness for 1+ month to left arm and leg, albeit slow improvement.  [facility] records and ortho records  reviewed.  Do think merits further evaluation with head CT and consideration for brain MRI. If  evidence of stroke, would reassess cholesterol and blood pressure control.  In interim, start aspirin '81mg'$  daily. Pt agrees with plan.       Relevant Orders   CT HEAD WO CONTRAST (5MM)   Other Visit Diagnoses     Need for influenza vaccination       Relevant Orders   Flu Vaccine QUAD High Dose(Fluad) (Completed)        Meds ordered this encounter  Medications   aspirin EC 81 MG tablet    Sig: Take 1 tablet (81 mg total) by mouth daily. Swallow whole.   Orders Placed This Encounter  Procedures   CT HEAD WO CONTRAST (5MM)    Standing Status:   Future    Standing Expiration Date:   09/16/2023    Order Specific Question:   Preferred imaging location?    Answer:   [id number]   Flu Vaccine QUAD High Dose(Fluad)     Patient Instructions  Flu shot today  Restart aspirin '81mg'$  daily.  I'd like to check head CT for further evaluation.  You may call [phone] to schedule CT scan.  Good to see you today.   Follow up plan: Return if symptoms worsen or fail to improve.  Ria Bush, MD

## 2022-09-15 NOTE — Assessment & Plan Note (Addendum)
Declines statin given son's bad experience on this family of medications.

## 2022-09-15 NOTE — Assessment & Plan Note (Signed)
BP remains elevated however home readings largely well controlled. Has component of white coat hypertension.

## 2022-09-15 NOTE — Assessment & Plan Note (Addendum)
Persistent numbness for 1+ month to left arm and leg, albeit slow improvement.  [facility] records and ortho records reviewed.  Do think merits further evaluation with head CT and consideration for brain MRI. If evidence of stroke, would reassess cholesterol and blood pressure control.  In interim, start aspirin '81mg'$  daily. Pt agrees with plan.

## 2022-09-22 DIAGNOSIS — H5211 Myopia, right eye: Secondary | ICD-10-CM | POA: Diagnosis not present

## 2022-09-22 DIAGNOSIS — H524 Presbyopia: Secondary | ICD-10-CM | POA: Diagnosis not present

## 2022-09-22 DIAGNOSIS — H401132 Primary open-angle glaucoma, bilateral, moderate stage: Secondary | ICD-10-CM | POA: Diagnosis not present

## 2022-09-22 DIAGNOSIS — Z961 Presence of intraocular lens: Secondary | ICD-10-CM | POA: Diagnosis not present

## 2022-10-18 DIAGNOSIS — R062 Wheezing: Secondary | ICD-10-CM | POA: Diagnosis not present

## 2022-10-18 DIAGNOSIS — J3081 Allergic rhinitis due to animal (cat) (dog) hair and dander: Secondary | ICD-10-CM | POA: Diagnosis not present

## 2022-10-20 ENCOUNTER — Ambulatory Visit
Admission: RE | Admit: 2022-10-20 | Discharge: 2022-10-20 | Disposition: A | Discharge status: Home / Self Care | Payer: Medicare HMO | Source: Ambulatory Visit | Attending: Family Medicine | Admitting: Family Medicine

## 2022-10-20 DIAGNOSIS — R200 Anesthesia of skin: Secondary | ICD-10-CM | POA: Diagnosis not present

## 2022-10-26 ENCOUNTER — Inpatient Hospital Stay: Payer: Medicare HMO | Attending: Hematology & Oncology

## 2022-10-26 ENCOUNTER — Other Ambulatory Visit: Payer: Self-pay | Admitting: *Deleted

## 2022-10-26 ENCOUNTER — Inpatient Hospital Stay (HOSPITAL_BASED_OUTPATIENT_CLINIC_OR_DEPARTMENT_OTHER): Payer: Medicare HMO | Admitting: Hematology & Oncology

## 2022-10-26 ENCOUNTER — Encounter: Payer: Self-pay | Admitting: Hematology & Oncology

## 2022-10-26 ENCOUNTER — Other Ambulatory Visit: Payer: Self-pay

## 2022-10-26 VITALS — BP 160/77 | HR 95 | Temp 97.8°F | Resp 18 | Wt 144.0 lb

## 2022-10-26 DIAGNOSIS — R531 Weakness: Secondary | ICD-10-CM | POA: Diagnosis not present

## 2022-10-26 DIAGNOSIS — D45 Polycythemia vera: Secondary | ICD-10-CM | POA: Diagnosis not present

## 2022-10-26 DIAGNOSIS — Z9221 Personal history of antineoplastic chemotherapy: Secondary | ICD-10-CM | POA: Insufficient documentation

## 2022-10-26 DIAGNOSIS — Z79899 Other long term (current) drug therapy: Secondary | ICD-10-CM | POA: Insufficient documentation

## 2022-10-26 DIAGNOSIS — Z7982 Long term (current) use of aspirin: Secondary | ICD-10-CM | POA: Diagnosis not present

## 2022-10-26 DIAGNOSIS — R200 Anesthesia of skin: Secondary | ICD-10-CM | POA: Insufficient documentation

## 2022-10-26 DIAGNOSIS — D751 Secondary polycythemia: Secondary | ICD-10-CM | POA: Diagnosis not present

## 2022-10-26 DIAGNOSIS — Z853 Personal history of malignant neoplasm of breast: Secondary | ICD-10-CM

## 2022-10-26 DIAGNOSIS — R29898 Other symptoms and signs involving the musculoskeletal system: Secondary | ICD-10-CM | POA: Diagnosis not present

## 2022-10-26 DIAGNOSIS — Z7189 Other specified counseling: Secondary | ICD-10-CM

## 2022-10-26 LAB — COMPREHENSIVE METABOLIC PANEL
ALT: 21 U/L (ref 0–44)
AST: 20 U/L (ref 15–41)
Albumin: 4.6 g/dL (ref 3.5–5.0)
Alkaline Phosphatase: 51 U/L (ref 38–126)
Anion gap: 7 (ref 5–15)
BUN: 15 mg/dL (ref 8–23)
CO2: 29 mmol/L (ref 22–32)
Calcium: 10 mg/dL (ref 8.9–10.3)
Chloride: 104 mmol/L (ref 98–111)
Creatinine, Ser: 0.95 mg/dL (ref 0.44–1.00)
GFR, Estimated: 60 mL/min — ABNORMAL LOW (ref >60)
Glucose, Bld: 115 mg/dL — ABNORMAL HIGH (ref 70–99)
Potassium: 4.7 mmol/L (ref 3.5–5.1)
Sodium: 140 mmol/L (ref 135–145)
Total Bilirubin: 1.1 mg/dL (ref 0.3–1.2)
Total Protein: 7.4 g/dL (ref 6.5–8.1)

## 2022-10-26 LAB — CBC WITH DIFFERENTIAL (CANCER CENTER ONLY)
Abs Immature Granulocytes: 0.14 10*3/uL — ABNORMAL HIGH (ref 0.00–0.07)
Basophils Absolute: 0.1 10*3/uL (ref 0.0–0.1)
Basophils Relative: 1 %
Eosinophils Absolute: 0.5 10*3/uL (ref 0.0–0.5)
Eosinophils Relative: 5 %
HCT: 49.2 % — ABNORMAL HIGH (ref 36.0–46.0)
Hemoglobin: 15.8 g/dL — ABNORMAL HIGH (ref 12.0–15.0)
Immature Granulocytes: 1 %
Lymphocytes Relative: 33 %
Lymphs Abs: 3.5 10*3/uL (ref 0.7–4.0)
MCH: 29.8 pg (ref 26.0–34.0)
MCHC: 32.1 g/dL (ref 30.0–36.0)
MCV: 92.7 fL (ref 80.0–100.0)
Monocytes Absolute: 1.2 10*3/uL — ABNORMAL HIGH (ref 0.1–1.0)
Monocytes Relative: 12 %
Neutro Abs: 5.2 10*3/uL (ref 1.7–7.7)
Neutrophils Relative %: 48 %
Platelet Count: 274 10*3/uL (ref 150–400)
RBC: 5.31 MIL/uL — ABNORMAL HIGH (ref 3.87–5.11)
RDW: 13.6 % (ref 11.5–15.5)
WBC Count: 10.6 10*3/uL — ABNORMAL HIGH (ref 4.0–10.5)
nRBC: 0 % (ref 0.0–0.2)

## 2022-10-26 LAB — LACTATE DEHYDROGENASE: LDH: 174 U/L (ref 98–192)

## 2022-10-26 MED ORDER — ROSUVASTATIN CALCIUM 10 MG PO TABS: 10.0000 mg | ORAL_TABLET | Freq: Every day | ORAL | 12 refills | Status: DC

## 2022-10-26 NOTE — Progress Notes (Signed)
Hematology and Oncology Follow Up Visit  Joanna White [id number] 04/28/1941 81 y.o. 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    Current Therapy:        Observation   Interim History:  Joanna White is here today for follow-up.  The problem that we may have with her is that she may have had a mini stroke or possible CVA.  Again is hard to tell what might be going on.  She said that back in the August or September, she got some numbness on the left leg.  She then got numbness in the left arm.  She was seen by her family doctor.  She was seen by multiple other doctors.  She has had a fairly extensive workup.  She ultimately had a CT of the brain that was done in November.  This did not show anything that was obvious.  She had carotid Dopplers that were done which were negative for any significant stenosis.  She is on baby aspirin right now.  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  I told her that she needs to donate blood.  I do not think this will be a problem for her.  She certainly has enough blood to donate.  Noted that back in February, she had lipid studies done.  The cholesterol/HDL ratio was 6.  Her HDL was 46.  Her LDL was 188.  I think that she probably needs to be on a statin drug.  I will put her on Crestor 10 mg a day.  Ultimately, I think that she is going into need to have an MRI of the brain.  She still has the numbness on the left side.  She says it is not as bad.  It is only in the left forearm and below the knee now in the left leg.  Otherwise she is doing okay.  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  She did have a nice Thanksgiving.  She has not had breast cancer now for about I think 25 years.  I cannot imagine that this would be a problem for her.  Currently, I would say performance status is probably ECOG 1.   Medications:  Allergies as of 10/26/2022       Reactions   Flonase  [fluticasone  Propionate] Anaphylaxis   Fluticasone Propionate Other (See Comments)   REACTION: throat tight        Medication List        Accurate as of October 26, 2022  1:37 PM. If you have any questions, ask your nurse or doctor.          aspirin EC 81 MG tablet Take 1 tablet (81 mg total) by mouth daily. Swallow whole.   latanoprost 0.005 % ophthalmic solution Commonly known as: XALATAN Place into both eyes at bedtime.   levothyroxine 75 MCG tablet Commonly known as: SYNTHROID Take 1 tablet (75 mcg total) by mouth daily before breakfast.   multivitamin tablet Take 1 tablet by mouth daily. With calcium   OIL OF OREGANO PO Take by mouth.   rosuvastatin 10 MG tablet Commonly known as: Crestor Take 1 tablet (10 mg total) by mouth daily. Started by: Volanda Napoleon, MD   Vitamin D3 25 MCG (1000 UT) Caps Take 1 capsule (1,000 Units total) by mouth daily.        Allergies:  Allergies  Allergen Reactions   Flonase  [Fluticasone Propionate] Anaphylaxis  Fluticasone Propionate Other (See Comments)    REACTION: throat tight    Past Medical History, Surgical history, Social history, and Family History were reviewed and updated.  Review of Systems: Review of Systems  Constitutional: Negative.   HENT: Negative.    Eyes: Negative.   Respiratory: Negative.    Cardiovascular: Negative.   Gastrointestinal: Negative.   Genitourinary: Negative.   Musculoskeletal: Negative.   Skin: Negative.   Neurological:  Positive for tingling, sensory change and focal weakness.  Endo/Heme/Allergies: Negative.   Psychiatric/Behavioral: Negative.       Physical Exam:  weight is 144 lb (65.3 kg). Her oral temperature is 97.8 F (36.6 C). Her blood pressure is 160/77 (abnormal) and her pulse is 95. Her respiration is 18 and oxygen saturation is 98%.   Wt Readings from Last 3 Encounters:  10/26/22 144 lb (65.3 kg)  09/15/22 144 lb 8 oz (65.5 kg)  02/16/22 145 lb 2 oz (65.8 kg)     Physical Exam Vitals reviewed.  HENT:     Head: Normocephalic and atraumatic.  Eyes:     Pupils: Pupils are equal, round, and reactive to light.  Cardiovascular:     Rate and Rhythm: Normal rate and regular rhythm.     Heart sounds: Normal heart sounds.  Pulmonary:     Effort: Pulmonary effort is normal.     Breath sounds: Normal breath sounds.  Abdominal:     General: Bowel sounds are normal.     Palpations: Abdomen is soft.  Musculoskeletal:        General: No tenderness or deformity. Normal range of motion.     Cervical back: Normal range of motion.  Lymphadenopathy:     Cervical: No cervical adenopathy.  Skin:    General: Skin is warm and dry.     Findings: No erythema or rash.  Neurological:     Mental Status: She is alert and oriented to person, place, and time.  Psychiatric:        Behavior: Behavior normal.        Thought Content: Thought content normal.        Judgment: Judgment normal.      Lab Results  Component Value Date   WBC 10.6 (H) 10/26/2022   HGB 15.8 (H) 10/26/2022   HCT 49.2 (H) 10/26/2022   MCV 92.7 10/26/2022   PLT 274 10/26/2022   No results found for: "FERRITIN", "IRON", "TIBC", "UIBC", "IRONPCTSAT" Lab Results  Component Value Date   RBC 5.31 (H) 10/26/2022   No results found for: "KPAFRELGTCHN", "LAMBDASER", "KAPLAMBRATIO" No results found for: "IGGSERUM", "IGA", "IGMSERUM" No results found for: "TOTALPROTELP", "ALBUMINELP", "A1GS", "A2GS", "BETS", "BETA2SER", "GAMS", "MSPIKE", "SPEI"   Chemistry      Component Value Date/Time   NA 140 10/26/2022 1146   NA 142 09/08/2017 1028   K 4.7 10/26/2022 1146   K 4.5 09/08/2017 1028   CL 104 10/26/2022 1146   CL 103 09/14/2014 1111   CO2 29 10/26/2022 1146   CO2 26 09/08/2017 1028   BUN 15 10/26/2022 1146   BUN 18.9 09/08/2017 1028   CREATININE 0.95 10/26/2022 1146   CREATININE 0.83 10/22/2021 1102   CREATININE 1.2 (H) 09/08/2017 1028      Component Value Date/Time   CALCIUM  10.0 10/26/2022 1146   CALCIUM 9.8 09/08/2017 1028   ALKPHOS 51 10/26/2022 1146   ALKPHOS 57 09/08/2017 1028   AST 20 10/26/2022 1146   AST 19 10/22/2021 1102   AST 21 09/08/2017  1028   ALT 21 10/26/2022 1146   ALT 15 10/22/2021 1102   ALT 15 09/08/2017 1028   BILITOT 1.1 10/26/2022 1146   BILITOT 0.7 10/22/2021 1102   BILITOT 0.57 09/08/2017 1028       Impression and Plan: Ms. Bellin is a very pleasant 81 yo white female with history of stage IIA ductal carcinoma of the left breast treated with adjuvant chemotherapy with CMF. Her tumor was ER negative.   She is now out about 25 years.  Again, the problem now is this neurological issue.  I know she sees her family doctor in a couple weeks.  I do believe that she will need to have an MRI of the brain.  I want to make sure that we are not overlooking anything significant with respect to breast cancer.  I realize that it would be very unusual for breast cancer to show up after 25 years but this is always possible.  I would like to have her come back in 3 months.  I think this is very reasonable.  If everything looks good in 3 months, then we might be able to move her out to a year.  Going to do a JAK2 on her just to make sure she does not have polycythemia or other myeloproliferative neoplasm.    Volanda Napoleon, MD 11/27/20231:37 PM

## 2022-10-27 ENCOUNTER — Other Ambulatory Visit: Payer: Medicare HMO

## 2022-10-28 ENCOUNTER — Encounter: Payer: Self-pay | Admitting: Hematology & Oncology

## 2022-11-04 ENCOUNTER — Telehealth: Payer: Self-pay

## 2022-11-04 NOTE — Telephone Encounter (Signed)
Received phone call and MyChart message from patient and her daughter. Pt requesting to have phlebotomy completed at our office. Upon discussion with Dr. Marin Olp his recommendation is for patient to donate red blood cells at [facility] or [facility]. Pt stated she doesn't have a GPS to get to donation sites and that her daughter works daily and unable to take her. This RN educated patient that there are donation sites throughout the [place] at churches, hospital sites and gyms, etc. Pt verbalized understanding but seem frustrated. Pt verbalized frustration with having a hard time getting through to a person in the office. Pt offered apologies and patient very concerned that if she donates anywhere else they "wont do it right" Pt educated that [facility] and [facility] are very proficient at performing phlebotomies. Pt verbalized understanding and had no further questions.

## 2022-11-11 DIAGNOSIS — Z1231 Encounter for screening mammogram for malignant neoplasm of breast: Secondary | ICD-10-CM | POA: Diagnosis not present

## 2022-11-13 ENCOUNTER — Encounter: Payer: Self-pay | Admitting: Podiatry

## 2022-11-13 ENCOUNTER — Ambulatory Visit (INDEPENDENT_AMBULATORY_CARE_PROVIDER_SITE_OTHER): Payer: Medicare HMO

## 2022-11-13 ENCOUNTER — Ambulatory Visit: Payer: Medicare HMO | Admitting: Podiatry

## 2022-11-13 DIAGNOSIS — M7750 Other enthesopathy of unspecified foot: Secondary | ICD-10-CM | POA: Diagnosis not present

## 2022-11-13 DIAGNOSIS — M7752 Other enthesopathy of left foot: Secondary | ICD-10-CM

## 2022-11-13 DIAGNOSIS — M722 Plantar fascial fibromatosis: Secondary | ICD-10-CM | POA: Diagnosis not present

## 2022-11-13 MED ORDER — TRIAMCINOLONE ACETONIDE 10 MG/ML IJ SUSP
10.0000 mg | Freq: Once | INTRAMUSCULAR | Status: AC
  Administered 2022-11-13: 10 mg

## 2022-11-13 NOTE — Patient Instructions (Signed)

## 2022-11-15 NOTE — Progress Notes (Signed)
Subjective:   Patient ID: Joanna White, female   DOB: 81 y.o.   MRN: [id number]   HPI Patient presents with pain in the plantar aspect left heel of several months duration worse when she gets up in the morning after periods of sitting.  Patient does not smoke likes to be active if possible   Review of Systems  All other systems reviewed and are negative.       Objective:  Physical Exam Vitals and nursing note reviewed.  Constitutional:      Appearance: She is well-developed.  Pulmonary:     Effort: Pulmonary effort is normal.  Musculoskeletal:        General: Normal range of motion.  Skin:    General: Skin is warm.  Neurological:     Mental Status: She is alert.     Neurovascular status intact muscle strength found to be adequate range of motion adequate with exquisite discomfort left plantar fascia at the insertional point tendon calcaneus inflammation fluid around the medial band moderate depression of the arch     Assessment:  Acute plantar fasciitis left with inflammation fluid around the medial band     Plan:  H&P x-ray reviewed condition discussed and today I did sterile prep injected the fascia at insertion 3 mg Kenalog 5 mg Xylocaine applied fascial brace which was fitted properly into her arch to lift it and gave instructions for ice therapy.  Reappoint to recheck  X-rays indicate small spur no indication stress fracture arthritis

## 2022-11-17 ENCOUNTER — Ambulatory Visit
Admission: RE | Admit: 2022-11-17 | Discharge: 2022-11-17 | Disposition: A | Discharge status: Home / Self Care | Payer: Medicare HMO | Source: Ambulatory Visit | Attending: Hematology & Oncology | Admitting: Hematology & Oncology

## 2022-11-17 DIAGNOSIS — R29898 Other symptoms and signs involving the musculoskeletal system: Secondary | ICD-10-CM

## 2022-11-17 DIAGNOSIS — I6782 Cerebral ischemia: Secondary | ICD-10-CM | POA: Diagnosis not present

## 2022-11-17 MED ORDER — GADOPICLENOL 0.5 MMOL/ML IV SOLN
7.5000 mL | Freq: Once | INTRAVENOUS | Status: AC | PRN
  Administered 2022-11-17: 7.5 mL via INTRAVENOUS

## 2022-11-18 NOTE — Telephone Encounter (Signed)
LMOM advising pt of results - ok per [facility]

## 2022-11-19 ENCOUNTER — Encounter: Payer: Self-pay | Admitting: Hematology & Oncology

## 2023-01-26 ENCOUNTER — Other Ambulatory Visit: Payer: Self-pay

## 2023-01-26 ENCOUNTER — Inpatient Hospital Stay: Payer: Medicare HMO | Attending: Hematology & Oncology

## 2023-01-26 ENCOUNTER — Inpatient Hospital Stay: Payer: Medicare HMO | Admitting: Medical Oncology

## 2023-01-26 ENCOUNTER — Encounter: Payer: Self-pay | Admitting: Medical Oncology

## 2023-01-26 VITALS — BP 151/66 | HR 91 | Temp 98.3°F | Resp 18 | Ht 60.0 in | Wt 148.1 lb

## 2023-01-26 DIAGNOSIS — Z853 Personal history of malignant neoplasm of breast: Secondary | ICD-10-CM | POA: Insufficient documentation

## 2023-01-26 DIAGNOSIS — D45 Polycythemia vera: Secondary | ICD-10-CM | POA: Diagnosis not present

## 2023-01-26 DIAGNOSIS — R29898 Other symptoms and signs involving the musculoskeletal system: Secondary | ICD-10-CM

## 2023-01-26 DIAGNOSIS — R200 Anesthesia of skin: Secondary | ICD-10-CM | POA: Diagnosis not present

## 2023-01-26 DIAGNOSIS — Z7189 Other specified counseling: Secondary | ICD-10-CM

## 2023-01-26 DIAGNOSIS — Z79899 Other long term (current) drug therapy: Secondary | ICD-10-CM | POA: Insufficient documentation

## 2023-01-26 DIAGNOSIS — D751 Secondary polycythemia: Secondary | ICD-10-CM

## 2023-01-26 LAB — CBC WITH DIFFERENTIAL (CANCER CENTER ONLY)
Abs Immature Granulocytes: 0.02 10*3/uL (ref 0.00–0.07)
Basophils Absolute: 0.1 10*3/uL (ref 0.0–0.1)
Basophils Relative: 1 %
Eosinophils Absolute: 0.4 10*3/uL (ref 0.0–0.5)
Eosinophils Relative: 5 %
HCT: 45.7 % (ref 36.0–46.0)
Hemoglobin: 14.8 g/dL (ref 12.0–15.0)
Immature Granulocytes: 0 %
Lymphocytes Relative: 33 %
Lymphs Abs: 2.9 10*3/uL (ref 0.7–4.0)
MCH: 29.5 pg (ref 26.0–34.0)
MCHC: 32.4 g/dL (ref 30.0–36.0)
MCV: 91.2 fL (ref 80.0–100.0)
Monocytes Absolute: 1.1 10*3/uL — ABNORMAL HIGH (ref 0.1–1.0)
Monocytes Relative: 12 %
Neutro Abs: 4.3 10*3/uL (ref 1.7–7.7)
Neutrophils Relative %: 49 %
Platelet Count: 208 10*3/uL (ref 150–400)
RBC: 5.01 MIL/uL (ref 3.87–5.11)
RDW: 13 % (ref 11.5–15.5)
WBC Count: 8.8 10*3/uL (ref 4.0–10.5)
nRBC: 0 % (ref 0.0–0.2)

## 2023-01-26 LAB — CMP (CANCER CENTER ONLY)
ALT: 16 U/L (ref 0–44)
AST: 25 U/L (ref 15–41)
Albumin: 4.6 g/dL (ref 3.5–5.0)
Alkaline Phosphatase: 37 U/L — ABNORMAL LOW (ref 38–126)
Anion gap: 9 (ref 5–15)
BUN: 13 mg/dL (ref 8–23)
CO2: 25 mmol/L (ref 22–32)
Calcium: 9.5 mg/dL (ref 8.9–10.3)
Chloride: 106 mmol/L (ref 98–111)
Creatinine: 0.82 mg/dL (ref 0.44–1.00)
GFR, Estimated: >60 mL/min (ref >60)
Glucose, Bld: 96 mg/dL (ref 70–99)
Potassium: 4 mmol/L (ref 3.5–5.1)
Sodium: 140 mmol/L (ref 135–145)
Total Bilirubin: 0.8 mg/dL (ref 0.3–1.2)
Total Protein: 7.5 g/dL (ref 6.5–8.1)

## 2023-01-26 LAB — IRON AND IRON BINDING CAPACITY (CC-WL,HP ONLY)
Iron: 73 ug/dL (ref 28–170)
Saturation Ratios: 16 % (ref 10.4–31.8)
TIBC: 444 ug/dL (ref 250–450)
UIBC: 371 ug/dL (ref 148–442)

## 2023-01-26 LAB — LIPID PANEL
Cholesterol: 131 mg/dL (ref 0–200)
HDL: 59 mg/dL (ref >40)
LDL Cholesterol: 56 mg/dL (ref 0–99)
Total CHOL/HDL Ratio: 2.2 RATIO
Triglycerides: 78 mg/dL (ref <150)
VLDL: 16 mg/dL (ref 0–40)

## 2023-01-26 LAB — FERRITIN: Ferritin: 44 ng/mL (ref 11–307)

## 2023-01-26 NOTE — Progress Notes (Signed)
Hematology and Oncology Follow Up Visit  Joanna White [id number] 1941-10-31 82 y.o. 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    Current Therapy:        Observation   Interim History:  Joanna White is here today for follow-up.    At her last visit she was started on Crestor given concerns for having had a potential TIA due to weakness in her left arm and leg. She has had extensive work up including MR brain which did not show evidence of metastatic breast cancer, MS or recent stroke. In addition she was told to donate blood once to help with mild erythrocytosis. Today she reports that her symptoms are still present but have significantly improved. For example, the numbness in her left arm used to extend up from fingers to neck. Now only from mid forearm to bottom 3 fingers. Left leg numbness used to extend up to torso. Now only distal from mid calf. She is tolerating the crestor well without any side effects. She also continues to see her PCP. She did donate blood.   She has not had breast cancer now for about I think 25 years. Recent imaging was normal. No signs of recurrence in history.   Currently, I would say performance status is probably ECOG 1.   Medications:  Allergies as of 01/26/2023       Reactions   Flonase  [fluticasone Propionate] Anaphylaxis   Fluticasone Propionate Other (See Comments)   REACTION: throat tight        Medication List        Accurate as of January 26, 2023 12:06 PM. If you have any questions, ask your nurse or doctor.          aspirin EC 81 MG tablet Take 1 tablet (81 mg total) by mouth daily. Swallow whole.   latanoprost 0.005 % ophthalmic solution Commonly known as: XALATAN Place into both eyes at bedtime.   levothyroxine 75 MCG tablet Commonly known as: SYNTHROID Take 1 tablet (75 mcg total) by mouth daily before breakfast.   multivitamin tablet Take 1 tablet by mouth  daily. With calcium   OIL OF OREGANO PO Take by mouth.   rosuvastatin 10 MG tablet Commonly known as: Crestor Take 1 tablet (10 mg total) by mouth daily.   Vitamin D3 25 MCG (1000 UT) capsule Generic drug: Cholecalciferol Take 1 capsule (1,000 Units total) by mouth daily.        Allergies:  Allergies  Allergen Reactions   Flonase  [Fluticasone Propionate] Anaphylaxis   Fluticasone Propionate Other (See Comments)    REACTION: throat tight    Past Medical History, Surgical history, Social history, and Family History were reviewed and updated.  Review of Systems: Review of Systems  Constitutional: Negative.   HENT: Negative.    Eyes: Negative.   Respiratory: Negative.    Cardiovascular: Negative.   Gastrointestinal: Negative.   Genitourinary: Negative.   Musculoskeletal: Negative.   Skin: Negative.   Neurological:  Positive for tingling, sensory change and focal weakness.  Endo/Heme/Allergies: Negative.   Psychiatric/Behavioral: Negative.       Physical Exam:  vitals were not taken for this visit.   Wt Readings from Last 3 Encounters:  10/26/22 144 lb (65.3 kg)  09/15/22 144 lb 8 oz (65.5 kg)  02/16/22 145 lb 2 oz (65.8 kg)    Physical Exam Vitals reviewed.  HENT:     Head: Normocephalic and atraumatic.  Eyes:     Pupils: Pupils are equal, round, and reactive to light.  Cardiovascular:     Rate and Rhythm: Normal rate and regular rhythm.     Heart sounds: Normal heart sounds.  Pulmonary:     Effort: Pulmonary effort is normal.     Breath sounds: Normal breath sounds.  Musculoskeletal:        General: No tenderness or deformity. Normal range of motion.     Cervical back: Normal range of motion.  Lymphadenopathy:     Cervical: No cervical adenopathy.  Skin:    General: Skin is warm and dry.     Findings: No erythema or rash.  Neurological:     Mental Status: She is alert and oriented to person, place, and time.  Psychiatric:        Behavior:  Behavior normal.        Thought Content: Thought content normal.        Judgment: Judgment normal.      Lab Results  Component Value Date   WBC 8.8 01/26/2023   HGB 14.8 01/26/2023   HCT 45.7 01/26/2023   MCV 91.2 01/26/2023   PLT 208 01/26/2023   No results found for: "FERRITIN", "IRON", "TIBC", "UIBC", "IRONPCTSAT" Lab Results  Component Value Date   RBC 5.01 01/26/2023   No results found for: "KPAFRELGTCHN", "LAMBDASER", "KAPLAMBRATIO" No results found for: "IGGSERUM", "IGA", "IGMSERUM" No results found for: "TOTALPROTELP", "ALBUMINELP", "A1GS", "A2GS", "BETS", "BETA2SER", "GAMS", "MSPIKE", "SPEI"   Chemistry      Component Value Date/Time   NA 140 10/26/2022 1146   NA 142 09/08/2017 1028   K 4.7 10/26/2022 1146   K 4.5 09/08/2017 1028   CL 104 10/26/2022 1146   CL 103 09/14/2014 1111   CO2 29 10/26/2022 1146   CO2 26 09/08/2017 1028   BUN 15 10/26/2022 1146   BUN 18.9 09/08/2017 1028   CREATININE 0.95 10/26/2022 1146   CREATININE 0.83 10/22/2021 1102   CREATININE 1.2 (H) 09/08/2017 1028      Component Value Date/Time   CALCIUM 10.0 10/26/2022 1146   CALCIUM 9.8 09/08/2017 1028   ALKPHOS 51 10/26/2022 1146   ALKPHOS 57 09/08/2017 1028   AST 20 10/26/2022 1146   AST 19 10/22/2021 1102   AST 21 09/08/2017 1028   ALT 21 10/26/2022 1146   ALT 15 10/22/2021 1102   ALT 15 09/08/2017 1028   BILITOT 1.1 10/26/2022 1146   BILITOT 0.7 10/22/2021 1102   BILITOT 0.57 09/08/2017 1028       Impression and Plan: Joanna White is a very pleasant 82 yo white female with history of stage IIA ductal carcinoma of the left breast treated with adjuvant chemotherapy with CMF. Her tumor was ER negative. She is now out about 25 years.  Doing well. Biggest concern at this time is her BP which is above target. She will continue working with her PCP regarding this. Reviewed her MR brain. Continue Crestor. CMP looks fine. Lipid pending.   JAK2 labs pending to rule out  polycythemia or other myeloproliferative neoplasm given increased. Today her CBC is significantly improved following donation of blood.   RTC 1 year  Hughie Closs, Hershal Coria 2/27/202412:06 PM

## 2023-01-27 LAB — ERYTHROPOIETIN: Erythropoietin: 9.2 m[IU]/mL (ref 2.6–18.5)

## 2023-02-03 ENCOUNTER — Telehealth: Payer: Self-pay | Admitting: Family Medicine

## 2023-02-03 LAB — JAK2 (INCLUDING V617F AND EXON 12), MPL,& CALR-NEXT GEN SEQ

## 2023-02-03 NOTE — Telephone Encounter (Signed)
Contacted [name] to schedule their annual wellness visit. Appointment made for 02/04/2023.  [facility] Direct Dial: [phone]

## 2023-02-04 ENCOUNTER — Ambulatory Visit (INDEPENDENT_AMBULATORY_CARE_PROVIDER_SITE_OTHER): Payer: Medicare HMO

## 2023-02-04 VITALS — Ht 60.0 in | Wt 143.0 lb

## 2023-02-04 DIAGNOSIS — Z0000 Encounter for general adult medical examination without abnormal findings: Secondary | ICD-10-CM | POA: Diagnosis not present

## 2023-02-04 NOTE — Patient Instructions (Signed)
Joanna White , Thank you for taking time to come for your Medicare Wellness Visit. I appreciate your ongoing commitment to your health goals. Please review the following plan we discussed and let me know if I can assist you in the future.   These are the goals we discussed:  Goals      DIET - INCREASE WATER INTAKE     Starting 03/02/2018, I will continue to drink at least 6-8 glasses of water daily.      Increase physical activity     Starting 03/01/17, I will continue to exercise at least 30 min daily.      Patient Stated     01/07/2021, I will maintain and continue medications as prescribed.      Patient Stated     Would like to lose 10lbs within the year     Patient Stated     No new goals        This is a list of the screening recommended for you and due dates:  Health Maintenance  Topic Date Due   DTaP/Tdap/Td vaccine (1 - Tdap) Never done   Zoster (Shingles) Vaccine (1 of 2) Never done   COVID-19 Vaccine (4 - 2023-24 season) 07/31/2022   Mammogram  11/12/2023   Medicare Annual Wellness Visit  02/04/2024   Pneumonia Vaccine  Completed   Flu Shot  Completed   DEXA scan (bone density measurement)  Completed   HPV Vaccine  Aged Out    Advanced directives: Please bring a copy of your health care power of attorney and living will to the office to be added to your chart at your convenience.   Conditions/risks identified: Aim for 30 minutes of exercise or brisk walking, 6-8 glasses of water, and 5 servings of fruits and vegetables each day.   Next appointment: Follow up in one year for your annual wellness visit 02/10/2024 @ 8:15 via telephone   Preventive Care 65 Years and Older, Female Preventive care refers to lifestyle choices and visits with your health care provider that can promote health and wellness. What does preventive care include? A yearly physical exam. This is also called an annual well check. Dental exams once or twice a year. Routine eye exams. Ask your  health care provider how often you should have your eyes checked. Personal lifestyle choices, including: Daily care of your teeth and gums. Regular physical activity. Eating a healthy diet. Avoiding tobacco and drug use. Limiting alcohol use. Practicing safe sex. Taking low-dose aspirin every day. Taking vitamin and mineral supplements as recommended by your health care provider. What happens during an annual well check? The services and screenings done by your health care provider during your annual well check will depend on your age, overall health, lifestyle risk factors, and family history of disease. Counseling  Your health care provider may ask you questions about your: Alcohol use. Tobacco use. Drug use. Emotional well-being. Home and relationship well-being. Sexual activity. Eating habits. History of falls. Memory and ability to understand (cognition). Work and work Statistician. Reproductive health. Screening  You may have the following tests or measurements: Height, weight, and BMI. Blood pressure. Lipid and cholesterol levels. These may be checked every 5 years, or more frequently if you are over 24 years old. Skin check. Lung cancer screening. You may have this screening every year starting at age 49 if you have a 30-pack-year history of smoking and currently smoke or have quit within the past 15 years. Fecal occult blood test (  FOBT) of the stool. You may have this test every year starting at age 44. Flexible sigmoidoscopy or colonoscopy. You may have a sigmoidoscopy every 5 years or a colonoscopy every 10 years starting at age 88. Hepatitis C blood test. Hepatitis B blood test. Sexually transmitted disease (STD) testing. Diabetes screening. This is done by checking your blood sugar (glucose) after you have not eaten for a while (fasting). You may have this done every 1-3 years. Bone density scan. This is done to screen for osteoporosis. You may have this done  starting at age 57. Mammogram. This may be done every 1-2 years. Talk to your health care provider about how often you should have regular mammograms. Talk with your health care provider about your test results, treatment options, and if necessary, the need for more tests. Vaccines  Your health care provider may recommend certain vaccines, such as: Influenza vaccine. This is recommended every year. Tetanus, diphtheria, and acellular pertussis (Tdap, Td) vaccine. You may need a Td booster every 10 years. Zoster vaccine. You may need this after age 63. Pneumococcal 13-valent conjugate (PCV13) vaccine. One dose is recommended after age 67. Pneumococcal polysaccharide (PPSV23) vaccine. One dose is recommended after age 6. Talk to your health care provider about which screenings and vaccines you need and how often you need them. This information is not intended to replace advice given to you by your health care provider. Make sure you discuss any questions you have with your health care provider. Document Released: 12/13/2015 Document Revised: 08/05/2016 Document Reviewed: 09/17/2015 Elsevier Interactive Patient Education  2017 [place] Prevention in the Home Falls can cause injuries. They can happen to people of all ages. There are many things you can do to make your home safe and to help prevent falls. What can I do on the outside of my home? Regularly fix the edges of walkways and driveways and fix any cracks. Remove anything that might make you trip as you walk through a door, such as a raised step or threshold. Trim any bushes or trees on the path to your home. Use bright outdoor lighting. Clear any walking paths of anything that might make someone trip, such as rocks or tools. Regularly check to see if handrails are loose or broken. Make sure that both sides of any steps have handrails. Any raised decks and porches should have guardrails on the edges. Have any leaves, snow, or  ice cleared regularly. Use sand or salt on walking paths during winter. Clean up any spills in your garage right away. This includes oil or grease spills. What can I do in the bathroom? Use night lights. Install grab bars by the toilet and in the tub and shower. Do not use towel bars as grab bars. Use non-skid mats or decals in the tub or shower. If you need to sit down in the shower, use a plastic, non-slip stool. Keep the floor dry. Clean up any water that spills on the floor as soon as it happens. Remove soap buildup in the tub or shower regularly. Attach bath mats securely with double-sided non-slip rug tape. Do not have throw rugs and other things on the floor that can make you trip. What can I do in the bedroom? Use night lights. Make sure that you have a light by your bed that is easy to reach. Do not use any sheets or blankets that are too big for your bed. They should not hang down onto the floor. Have a  firm chair that has side arms. You can use this for support while you get dressed. Do not have throw rugs and other things on the floor that can make you trip. What can I do in the kitchen? Clean up any spills right away. Avoid walking on wet floors. Keep items that you use a lot in easy-to-reach places. If you need to reach something above you, use a strong step stool that has a grab bar. Keep electrical cords out of the way. Do not use floor polish or wax that makes floors slippery. If you must use wax, use non-skid floor wax. Do not have throw rugs and other things on the floor that can make you trip. What can I do with my stairs? Do not leave any items on the stairs. Make sure that there are handrails on both sides of the stairs and use them. Fix handrails that are broken or loose. Make sure that handrails are as long as the stairways. Check any carpeting to make sure that it is firmly attached to the stairs. Fix any carpet that is loose or worn. Avoid having throw rugs at  the top or bottom of the stairs. If you do have throw rugs, attach them to the floor with carpet tape. Make sure that you have a light switch at the top of the stairs and the bottom of the stairs. If you do not have them, ask someone to add them for you. What else can I do to help prevent falls? Wear shoes that: Do not have high heels. Have rubber bottoms. Are comfortable and fit you well. Are closed at the toe. Do not wear sandals. If you use a stepladder: Make sure that it is fully opened. Do not climb a closed stepladder. Make sure that both sides of the stepladder are locked into place. Ask someone to hold it for you, if possible. Clearly mark and make sure that you can see: Any grab bars or handrails. First and last steps. Where the edge of each step is. Use tools that help you move around (mobility aids) if they are needed. These include: Canes. Walkers. Scooters. Crutches. Turn on the lights when you go into a dark area. Replace any light bulbs as soon as they burn out. Set up your furniture so you have a clear path. Avoid moving your furniture around. If any of your floors are uneven, fix them. If there are any pets around you, be aware of where they are. Review your medicines with your doctor. Some medicines can make you feel dizzy. This can increase your chance of falling. Ask your doctor what other things that you can do to help prevent falls. This information is not intended to replace advice given to you by your health care provider. Make sure you discuss any questions you have with your health care provider. Document Released: 09/12/2009 Document Revised: 04/23/2016 Document Reviewed: 12/21/2014 Elsevier Interactive Patient Education  2017 [employer].

## 2023-02-04 NOTE — Progress Notes (Signed)
I connected with  Joanna White on 02/04/23 by a audio enabled telemedicine application and verified that I am speaking with the correct person using two identifiers.  Patient Location: Home  Provider Location: Home Office  I discussed the limitations of evaluation and management by telemedicine. The patient expressed understanding and agreed to proceed.  Subjective:   Joanna White is a 82 y.o. female who presents for Medicare Annual (Subsequent) preventive examination.  Review of Systems      Cardiac Risk Factors include: advanced age (>60mn, >>14women)     Objective:    Today's Vitals   02/04/23 1349  Weight: 143 lb (64.9 kg)  Height: 5' (1.524 m)   Body mass index is 27.93 kg/m.     02/04/2023    1:56 PM 01/26/2023   12:20 PM 01/08/2022    8:25 AM 10/22/2021   11:21 AM 01/07/2021   10:32 AM 10/22/2020    9:09 AM 09/08/2018   10:48 AM  Advanced Directives  Does Patient Have a Medical Advance Directive? Yes No Yes Yes Yes No No  Type of AParamedicof [place] will  [place] will Living will;Healthcare Power of [place] will    Does patient want to make changes to medical advance directive?   Yes (MAU/Ambulatory/Procedural Areas - Information given) No - Patient declined     Copy of [place] Chart? No - copy requested   No - copy requested No - copy requested    Would patient like information on creating a medical advance directive?  No - Patient declined    No - Patient declined     Current Medications (verified) Outpatient Encounter Medications as of 02/04/2023  Medication Sig   aspirin EC 81 MG tablet Take 1 tablet (81 mg total) by mouth daily. Swallow whole.   Cholecalciferol (VITAMIN D3) 1000 units CAPS Take 1 capsule (1,000 Units total) by mouth daily.   latanoprost (XALATAN) 0.005 % ophthalmic solution Place into both eyes at bedtime.     levothyroxine (SYNTHROID) 75 MCG tablet Take 1 tablet (75 mcg total) by mouth daily before breakfast.   Multiple Vitamin (MULTIVITAMIN) tablet Take 1 tablet by mouth daily. With calcium   OIL OF [place] PO Take by mouth.   rosuvastatin (CRESTOR) 10 MG tablet Take 1 tablet (10 mg total) by mouth daily.   No facility-administered encounter medications on file as of 02/04/2023.    Allergies (verified) Flonase  [fluticasone propionate] and Fluticasone propionate   History: Past Medical History:  Diagnosis Date   Glaucoma    Hypothyroid    Osteopenia 08/2011   h/o osteoporosis, s/p boniva for 5 yrs   Personal history of malignant neoplasm of breast 1998   Left   Past Surgical History:  Procedure Laterality Date   BREAST LUMPECTOMY  1998   Left   CESAREAN SECTION     x 2   DEXA  10/2015   osteopenia, T -1.1 spine, hip -1.7    GYN surgery     uterine lining removed for heavy bleeding and polyp   lymph node removal  1998   left axilla   Thumb surgery     bilateral   TOTAL KNEE ARTHROPLASTY Left 2016   (Norberto Sorenson   Family History  Problem Relation Age of Onset   Diabetes Brother        x2   Cancer Father 67      lung  Cancer Mother 58       MM   Coronary artery disease Brother 75       CABG   Diabetes Brother    Diabetes Maternal Grandmother    Vasculitis Nephew        wegener's vasculitis   Stroke Neg Hx    Social History   Socioeconomic History   Marital status: Widowed    Spouse name: Not on file   Number of children: 2   Years of education: Not on file   Highest education level: Not on file  Occupational History   Occupation: Retired  Tobacco Use   Smoking status: Never   Smokeless tobacco: Never  Vaping Use   Vaping Use: Never used  Substance and Sexual Activity   Alcohol use: No    Alcohol/week: 0.0 standard drinks of alcohol   Drug use: No   Sexual activity: Never  Other Topics Concern   Not on file  Social History Narrative   Caffeine: 2cups  coffee/day   Widow-husband died of MI on operating table   2 children, grown   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   Social Determinants of Health   Financial Resource Strain: [place]  (02/04/2023)   Overall Financial Resource Strain (CARDIA)    Difficulty of Paying Living Expenses: Not hard at all  Food Insecurity: No [place] (02/04/2023)   Hunger Vital Sign    Worried About Running Out of Food in the Last Year: Never true    [place] in the Last Year: Never true  Transportation Needs: No Transportation Needs (02/04/2023)   PRAPARE - Hydrologist (Medical): No    Lack of Transportation (Non-Medical): No  Physical Activity: Inactive (02/04/2023)   Exercise Vital Sign    Days of Exercise per Week: 0 days    Minutes of Exercise per Session: 0 min  Stress: No Stress Concern Present (02/04/2023)   [place]    Feeling of Stress : Not at all  Social Connections: Moderately Isolated (02/04/2023)   Social Connection and Isolation Panel [NHANES]    Frequency of Communication with Friends and Family: More than three times a week    Frequency of Social Gatherings with Friends and Family: More than three times a week    Attends Religious Services: More than 4 times per year    Active Member of [employer] or Organizations: No    Attends Archivist Meetings: Never    Marital Status: Widowed    Tobacco Counseling Counseling given: Not Answered   Clinical Intake:  Pre-visit preparation completed: Yes  Pain : No/denies pain     Nutritional Risks: None Diabetes: No  How often do you need to have someone help you when you read instructions, pamphlets, or other written materials from your doctor or pharmacy?: 1 - Never  Diabetic? no   Interpreter Needed?: No  Information entered by :: [place] of  Daily Living    02/04/2023    1:57 PM  In your present state of health, do you have any difficulty performing the following activities:  Hearing? 0  Vision? 0  Difficulty concentrating or making decisions? 0  Walking or climbing stairs? 0  Dressing or bathing? 0  Doing errands, shopping? 0  Preparing Food and eating ? N  Using the Toilet? N  In the  past six months, have you accidently leaked urine? N  Do you have problems with loss of bowel control? N  Managing your Medications? N  Managing your Finances? N  Housekeeping or managing your Housekeeping? N    Patient Care Team: Ria Bush, MD as PCP - General Rutherford Guys, MD as Consulting Physician (Ophthalmology)  Indicate any recent Medical Services you may have received from other than [facility] providers in the past year (date may be approximate).     Assessment:   This is a routine wellness examination for [facility].  Hearing/Vision screen Hearing Screening - Comments:: No aids Vision Screening - Comments:: No glasses - Dr.Shipiro  Dietary issues and exercise activities discussed: Current Exercise Habits: The patient does not participate in regular exercise at present (works in yard), Exercise limited by: None identified   Goals Addressed             This Visit's Progress    Patient Stated       No new goals       Depression Screen    02/04/2023    1:55 PM 01/08/2022    8:28 AM 01/07/2021   10:34 AM 08/04/2019    8:33 AM 03/02/2018   10:19 AM 03/01/2017    9:32 AM 02/28/2016    9:42 AM  PHQ 2/9 Scores  PHQ - 2 Score 0 0 0 0 0 0 0  PHQ- 9 Score   0  0      Fall Risk    02/04/2023    1:57 PM 01/08/2022    8:27 AM 01/07/2021   10:33 AM 08/04/2019    8:33 AM 03/02/2018   10:19 AM  Fall Risk   Falls in the past year? 0 0 0 0 No  Number falls in past yr: 0 0 0    Injury with Fall? 0 0 0    Risk for fall due to : No Fall Risks No Fall Risks Medication side effect;No Fall Risks    Follow up Falls prevention  discussed;Falls evaluation completed Falls prevention discussed Falls evaluation completed;Falls prevention discussed      FALL RISK PREVENTION PERTAINING TO THE HOME:  Any stairs in or around the home? Yes  If so, are there any without handrails? No  Home free of loose throw rugs in walkways, pet beds, electrical cords, etc? Yes  Adequate lighting in your home to reduce risk of falls? Yes   ASSISTIVE DEVICES UTILIZED TO PREVENT FALLS:  Life alert? No  Use of a cane, walker or w/c? No  Grab bars in the bathroom? No  Shower chair or bench in shower? No  Elevated toilet seat or a handicapped toilet? No    Cognitive Function:    01/07/2021   10:37 AM 03/02/2018   10:19 AM 03/01/2017    9:22 AM 02/28/2016    9:35 AM  MMSE - Mini Mental State Exam  Orientation to time '5 5 5 5  '$ Orientation to Place '5 5 5 5  '$ Registration '3 3 3 3  '$ Attention/ Calculation 5 0 0 0  Recall '3 3 3 3  '$ Language- name 2 objects  0 0 0  Language- repeat '1 1 1 1  '$ Language- follow 3 step command  '3 3 3  '$ Language- read & follow direction  0 0 0  Write a sentence  0 0 0  Copy design  0 0 0  Total score  '20 20 20        '$ 02/04/2023  1:57 PM  6CIT Screen  What Year? 0 points  What month? 0 points  What time? 0 points  Count back from 20 0 points  Months in reverse 0 points  Repeat phrase 0 points  Total Score 0 points    Immunizations Immunization History  Administered Date(s) Administered   Fluad Quad(high Dose 65+) 08/04/2019, 09/15/2022   Influenza Whole 11/02/2007, 11/02/2012   Influenza, High Dose Seasonal PF 10/28/2020, 10/14/2021   Influenza,inj,Quad PF,6+ Mos 09/14/2013, 09/14/2014, 09/13/2015, 09/11/2016, 09/08/2017, 09/08/2018   PFIZER(Purple Top)SARS-COV-2 Vaccination 12/22/2019, 01/12/2020, 09/29/2020   Pneumococcal Conjugate-13 02/28/2016   Pneumococcal Polysaccharide-23 09/14/2014   Zoster, Live 12/01/2015    TDAP status: Due, Education has been provided regarding the importance of  this vaccine. Advised may receive this vaccine at local pharmacy or Health Dept. Aware to provide a copy of the vaccination record if obtained from local pharmacy or Health Dept. Verbalized acceptance and understanding.  Flu Vaccine status: Up to date  Pneumococcal vaccine status: Up to date  Covid-19 vaccine status: Information provided on how to obtain vaccines.   Qualifies for Shingles Vaccine? Yes   Zostavax completed  unknown   Shingrix Completed?: No.    Education has been provided regarding the importance of this vaccine. Patient has been advised to call insurance company to determine out of pocket expense if they have not yet received this vaccine. Advised may also receive vaccine at local pharmacy or Health Dept. Verbalized acceptance and understanding.  Screening Tests Health Maintenance  Topic Date Due   DTaP/Tdap/Td (1 - Tdap) Never done   Zoster Vaccines- Shingrix (1 of 2) Never done   COVID-19 Vaccine (4 - 2023-24 season) 07/31/2022   MAMMOGRAM  11/12/2023   Medicare Annual Wellness (AWV)  02/04/2024   Pneumonia Vaccine 54+ Years old  Completed   INFLUENZA VACCINE  Completed   DEXA SCAN  Completed   HPV VACCINES  Aged Out    Health Maintenance  Health Maintenance Due  Topic Date Due   DTaP/Tdap/Td (1 - Tdap) Never done   Zoster Vaccines- Shingrix (1 of 2) Never done   COVID-19 Vaccine (4 - 2023-24 season) 07/31/2022    Colorectal cancer screening: No longer required.   Mammogram status: Completed 11/11/2022. Repeat every year, pt will schedule.  Bone Density status: Completed 10/30/2020. Results reflect: Bone density results: OSTEOPENIA. Repeat every 2 years. Pt will schedule at time of mammogram.  Lung Cancer Screening: (Low Dose CT Chest recommended if Age 73-80 years, 30 pack-year currently smoking OR have quit w/in 15years.) does not qualify.   Lung Cancer Screening Referral: no  Additional Screening:  Hepatitis C Screening: does not qualify; Completed  no  Vision Screening: Recommended annual ophthalmology exams for early detection of glaucoma and other disorders of the eye. Is the patient up to date with their annual eye exam?  Yes  Who is the provider or what is the name of the office in which the patient attends annual eye exams? Dr.Shipiro If pt is not established with a provider, would they like to be referred to a provider to establish care? No .   Dental Screening: Recommended annual dental exams for proper oral hygiene  Community Resource Referral / Chronic Care Management: CRR required this visit?  No   CCM required this visit?  No      Plan:     I have personally reviewed and noted the following in the patient's chart:   Medical and social history Use of alcohol, tobacco or  illicit drugs  Current medications and supplements including opioid prescriptions. Patient is not currently taking opioid prescriptions. Functional ability and status Nutritional status Physical activity Advanced directives List of other physicians Hospitalizations, surgeries, and ER visits in previous 12 months Vitals Screenings to include cognitive, depression, and falls Referrals and appointments  In addition, I have reviewed and discussed with patient certain preventive protocols, quality metrics, and best practice recommendations. A written personalized care plan for preventive services as well as general preventive health recommendations were provided to patient.     Lebron Conners, LPN   [phone]   Nurse Notes: none

## 2023-02-22 ENCOUNTER — Encounter: Payer: Self-pay | Admitting: Family Medicine

## 2023-02-22 ENCOUNTER — Ambulatory Visit (INDEPENDENT_AMBULATORY_CARE_PROVIDER_SITE_OTHER): Payer: Medicare HMO | Admitting: Family Medicine

## 2023-02-22 VITALS — BP 138/80 | HR 93 | Temp 97.4°F | Ht 59.5 in | Wt 145.2 lb

## 2023-02-22 DIAGNOSIS — H401132 Primary open-angle glaucoma, bilateral, moderate stage: Secondary | ICD-10-CM

## 2023-02-22 DIAGNOSIS — Z7189 Other specified counseling: Secondary | ICD-10-CM | POA: Diagnosis not present

## 2023-02-22 DIAGNOSIS — Z0000 Encounter for general adult medical examination without abnormal findings: Secondary | ICD-10-CM | POA: Diagnosis not present

## 2023-02-22 DIAGNOSIS — E785 Hyperlipidemia, unspecified: Secondary | ICD-10-CM | POA: Diagnosis not present

## 2023-02-22 DIAGNOSIS — D751 Secondary polycythemia: Secondary | ICD-10-CM | POA: Diagnosis not present

## 2023-02-22 DIAGNOSIS — E039 Hypothyroidism, unspecified: Secondary | ICD-10-CM

## 2023-02-22 DIAGNOSIS — M85852 Other specified disorders of bone density and structure, left thigh: Secondary | ICD-10-CM | POA: Diagnosis not present

## 2023-02-22 DIAGNOSIS — R030 Elevated blood-pressure reading, without diagnosis of hypertension: Secondary | ICD-10-CM | POA: Diagnosis not present

## 2023-02-22 DIAGNOSIS — Z8673 Personal history of transient ischemic attack (TIA), and cerebral infarction without residual deficits: Secondary | ICD-10-CM

## 2023-02-22 DIAGNOSIS — Z853 Personal history of malignant neoplasm of breast: Secondary | ICD-10-CM | POA: Diagnosis not present

## 2023-02-22 MED ORDER — LEVOTHYROXINE SODIUM 75 MCG PO TABS: 75.0000 ug | ORAL_TABLET | Freq: Every day | ORAL | 4 refills | Status: DC

## 2023-02-22 MED ORDER — ROSUVASTATIN CALCIUM 10 MG PO TABS: 10.0000 mg | ORAL_TABLET | ORAL | 4 refills | Status: DC

## 2023-02-22 NOTE — Patient Instructions (Addendum)
Labs today  If interested, check with pharmacy about new 2 shot shingles series (shingrix).  Go ahead and call Dr Kellie Moor office for follow up appointment.  Keep appointment with oncologist for November. Restart crestor at 10mg  Mondays Wednesdays and Fridays.  Good to see you today  Return as needed or in 1 year for next physical/wellness visit

## 2023-02-22 NOTE — Progress Notes (Unsigned)
Patient ID: Joanna White, female    DOB: 1941/03/09, 82 y.o.   MRN: [id number]  This visit was conducted in person.  BP 138/80   Pulse 93   Temp (!) 97.4 F (36.3 C) (Temporal)   Ht 4' 11.5" (1.511 m)   Wt 145 lb 4 oz (65.9 kg)   SpO2 95%   BMI 28.85 kg/m    CC: CPE Subjective:   HPI: Joanna White is a 82 y.o. female presenting on 02/22/2023 for Annual Exam (MCR prt 2 [AWV- 02/04/23]. Wants to discuss Crestor. Had recent labs done with Dr. Marin Olp. )   Saw health advisor earlier this month for medicare wellness visit. Note reviewed.   No results found.  Flowsheet Row Clinical Support from 02/04/2023 in [facility]  PHQ-2 Total Score 0          02/04/2023    1:57 PM 01/08/2022    8:27 AM 01/07/2021   10:33 AM 08/04/2019    8:33 AM 03/02/2018   10:19 AM  Fall Risk   Falls in the past year? 0 0 0 0 No  Number falls in past yr: 0 0 0    Injury with Fall? 0 0 0    Risk for fall due to : No Fall Risks No Fall Risks Medication side effect;No Fall Risks    Follow up Falls prevention discussed;Falls evaluation completed Falls prevention discussed Falls evaluation completed;Falls prevention discussed     Home BP readings run well controlled.   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 recent labs through Dr Marin Olp.    Preventative: Colon cancer screening - cologuard 02/2016 and 08/2019 WNL. Age out. no blood in stool.  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 Mammo 10/2022 Birads-2 @ [facility]  Well woman exam - aged out.  DEXA 10/2015 osteopenia, T -1.1 spine, hip -1.7, DEXA T -1.5 hip, -0.2 spine (improvement) 03/2018.  DEXA 10/2020 - T -2.2 L femur neck, -0.4 spine. Drinks 1 gallon milk/wk. she walks regularly.  Lung cancer screening - never smoker  Flu shot yearly  Blomkest 12/2019, 01/2020, booster 2020-10-06  Tetanus shot -  unsure  Pneumovax 2015, Prevnar-13 2017 Zostavax - 2017 Shingrix - discussed Advanced directive discussion - has this at home, has talked to children who are aware of her wishes. Does not want prolonged life support if terminal condition, would be ok with temporary measures. Does not want to be cremated. Brother had prolonged illness in acute care facility and passed away 10/06/20. HCPOA would be 2 children. 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 Interim medical history since our last visit reviewed. Allergies and medications reviewed and updated. Outpatient Medications Prior to Visit  Medication Sig Dispense Refill   aspirin EC 81 MG  tablet Take 1 tablet (81 mg total) by mouth daily. Swallow whole.     Cholecalciferol (VITAMIN D3) 1000 units CAPS Take 1 capsule (1,000 Units total) by mouth daily. 30 capsule    latanoprost (XALATAN) 0.005 % ophthalmic solution Place into both eyes at bedtime.      levothyroxine (SYNTHROID) 75 MCG tablet Take 1 tablet (75 mcg total) by mouth daily before breakfast. 90 tablet 3   Multiple Vitamin (MULTIVITAMIN) tablet Take 1 tablet by mouth daily. With calcium     OIL OF OREGANO PO Take by mouth.     rosuvastatin (CRESTOR) 10 MG tablet Take 1 tablet (10 mg total) by mouth daily. (Patient not taking: Reported on 02/22/2023) 30 tablet 12   No facility-administered medications prior to visit.      Per HPI unless specifically indicated in ROS section below Review of Systems  Constitutional:  Negative for activity change, appetite change, chills, fatigue, fever and unexpected weight change.  HENT:  Negative for hearing loss.   Eyes:  Negative for visual disturbance.  Respiratory:  Negative for cough, chest tightness, shortness of breath and wheezing.   Cardiovascular:  Negative for chest pain, palpitations and leg swelling.  Gastrointestinal:  Negative for abdominal distention, abdominal pain, blood in stool, constipation, diarrhea, nausea and vomiting.  Genitourinary:  Negative for difficulty urinating and hematuria.  Musculoskeletal:  Negative for arthralgias, myalgias and neck pain.  Skin:  Negative for rash.  Neurological:  Negative for dizziness, seizures, syncope and headaches.  Hematological:  Negative for adenopathy. Does not bruise/bleed easily.  Psychiatric/Behavioral:  Negative for dysphoric mood. The patient is not nervous/anxious.     Objective:  BP 138/80   Pulse 93   Temp (!) 97.4 F (36.3 C) (Temporal)   Ht 4' 11.5" (1.511 m)   Wt 145 lb 4 oz (65.9 kg)   SpO2 95%   BMI 28.85 kg/m   Wt Readings from Last 3 Encounters:  02/22/23 145 lb 4 oz (65.9 kg)  02/04/23 143 lb (64.9 kg)  01/26/23 148 lb 1.3 oz (67.2 kg)      Physical Exam Vitals and nursing note reviewed.  Constitutional:      Appearance: Normal appearance. She is not ill-appearing.  HENT:     Head: Normocephalic and atraumatic.     Right Ear: Tympanic membrane, ear canal and external ear normal. There is no impacted cerumen.     Left Ear: Tympanic membrane, ear canal and external ear normal. There is no impacted cerumen.     Nose: Nose normal.     Mouth/Throat:     Mouth: Mucous membranes are moist.     Pharynx: Oropharynx is clear. No oropharyngeal exudate or posterior oropharyngeal erythema.  Eyes:     General:        Right eye: No discharge.        Left eye: No discharge.      Extraocular Movements: Extraocular movements intact.     Conjunctiva/sclera: Conjunctivae normal.     Pupils: Pupils are equal, round, and reactive to light.  Neck:     Thyroid: No thyroid mass or thyromegaly.     Vascular: No carotid bruit.  Cardiovascular:     Rate and Rhythm: Normal rate and regular rhythm.     Pulses: Normal pulses.     Heart sounds: Normal heart sounds. No murmur heard. Pulmonary:     Effort: Pulmonary effort is normal. No respiratory distress.     Breath sounds: Normal breath sounds. No  wheezing, rhonchi or rales.  Abdominal:     General: Bowel sounds are normal. There is no distension.     Palpations: Abdomen is soft. There is no mass.     Tenderness: There is no abdominal tenderness. There is no guarding or rebound.     Hernia: No hernia is present.  Musculoskeletal:     Cervical back: Normal range of motion and neck supple. No rigidity.     Right lower leg: No edema.     Left lower leg: No edema.  Lymphadenopathy:     Cervical: No cervical adenopathy.  Skin:    General: Skin is warm and dry.     Findings: No rash.  Neurological:     General: No focal deficit present.     Mental Status: She is alert. Mental status is at baseline.  Psychiatric:        Mood and Affect: Mood normal.        Behavior: Behavior normal.       Results for orders placed or performed in visit on 01/26/23  Iron and Iron Binding Capacity ([facility] only)  Result Value Ref Range   Iron 73 28 - 170 ug/dL   TIBC 444 250 - 450 ug/dL   Saturation Ratios 16 10.4 - 31.8 %   UIBC 371 148 - 442 ug/dL  Ferritin  Result Value Ref Range   Ferritin 44 11 - 307 ng/mL  Erythropoietin  Result Value Ref Range   Erythropoietin 9.2 2.6 - 18.5 mIU/mL  JAK2 (including V617F and Exon 12), MPL, and CALR-Next Generation Sequencing  Result Value Ref Range   JAK 2, MPL, CALR See Scanned report in [facility]   Lipid panel  Result Value Ref Range   Cholesterol 131 0 - 200 mg/dL    Triglycerides 78 <150 mg/dL   HDL 59 >40 mg/dL   Total CHOL/HDL Ratio 2.2 RATIO   VLDL 16 0 - 40 mg/dL   LDL Cholesterol 56 0 - 99 mg/dL  CMP (Cancer Center only)  Result Value Ref Range   Sodium 140 135 - 145 mmol/L   Potassium 4.0 3.5 - 5.1 mmol/L   Chloride 106 98 - 111 mmol/L   CO2 25 22 - 32 mmol/L   Glucose, Bld 96 70 - 99 mg/dL   BUN 13 8 - 23 mg/dL   Creatinine 0.82 0.44 - 1.00 mg/dL   Calcium 9.5 8.9 - 10.3 mg/dL   Total Protein 7.5 6.5 - 8.1 g/dL   Albumin 4.6 3.5 - 5.0 g/dL   AST 25 15 - 41 U/L   ALT 16 0 - 44 U/L   Alkaline Phosphatase 37 (L) 38 - 126 U/L   Total Bilirubin 0.8 0.3 - 1.2 mg/dL   GFR, Estimated >60 >60 mL/min   Anion gap 9 5 - 15  CBC with Differential (Cancer Center Only)  Result Value Ref Range   WBC Count 8.8 4.0 - 10.5 K/uL   RBC 5.01 3.87 - 5.11 MIL/uL   Hemoglobin 14.8 12.0 - 15.0 g/dL   HCT 45.7 36.0 - 46.0 %   MCV 91.2 80.0 - 100.0 fL   MCH 29.5 26.0 - 34.0 pg   MCHC 32.4 30.0 - 36.0 g/dL   RDW 13.0 11.5 - 15.5 %   Platelet Count 208 150 - 400 K/uL   nRBC 0.0 0.0 - 0.2 %   Neutrophils Relative % 49 %   Neutro Abs 4.3 1.7 - 7.7 K/uL   Lymphocytes Relative  33 %   Lymphs Abs 2.9 0.7 - 4.0 K/uL   Monocytes Relative 12 %   Monocytes Absolute 1.1 (H) 0.1 - 1.0 K/uL   Eosinophils Relative 5 %   Eosinophils Absolute 0.4 0.0 - 0.5 K/uL   Basophils Relative 1 %   Basophils Absolute 0.1 0.0 - 0.1 K/uL   Immature Granulocytes 0 %   Abs Immature Granulocytes 0.02 0.00 - 0.07 K/uL    Assessment & Plan:   Problem List Items Addressed This Visit     Advanced care planning/counseling discussion (Chronic)    Advanced directive discussion - has this at home, has talked to children who are aware of her wishes. Does not want prolonged life support if terminal condition, would be ok with temporary measures. Does not want to be cremated. Brother had prolonged illness in acute care facility and passed away Oct 11, 2020. HCPOA would be 2 children. Asked to  bring [place] copy      Health maintenance examination - Primary (Chronic)    Preventative protocols reviewed and updated unless pt declined. Discussed healthy diet and lifestyle.         No orders of the defined types were placed in this encounter.   No orders of the defined types were placed in this encounter.   Patient Instructions  If interested, check with pharmacy about new 2 shot shingles series (shingrix).  Go ahead and call Dr Kellie Moor office for follow up appointment.    Follow up plan: No follow-ups on file.  Ria Bush, MD

## 2023-02-22 NOTE — Assessment & Plan Note (Signed)
Advanced directive discussion - has this at home, has talked to children who are aware of her wishes. Does not want prolonged life support if terminal condition, would be ok with temporary measures. Does not want to be cremated. Brother had prolonged illness in acute care facility and passed away 10/14/20. HCPOA would be 2 children. Asked to bring [place] copy

## 2023-02-22 NOTE — Assessment & Plan Note (Signed)
Preventative protocols reviewed and updated unless pt declined. Discussed healthy diet and lifestyle.  

## 2023-02-23 DIAGNOSIS — I69354 Hemiplegia and hemiparesis following cerebral infarction affecting left non-dominant side: Secondary | ICD-10-CM | POA: Insufficient documentation

## 2023-02-23 DIAGNOSIS — Z8673 Personal history of transient ischemic attack (TIA), and cerebral infarction without residual deficits: Secondary | ICD-10-CM | POA: Insufficient documentation

## 2023-02-23 DIAGNOSIS — D751 Secondary polycythemia: Secondary | ICD-10-CM | POA: Insufficient documentation

## 2023-02-23 LAB — VITAMIN D 25 HYDROXY (VIT D DEFICIENCY, FRACTURES): VITD: 33.14 ng/mL (ref 30.00–100.00)

## 2023-02-23 LAB — TSH: TSH: 0.68 u[IU]/mL (ref 0.35–5.50)

## 2023-02-23 NOTE — Assessment & Plan Note (Signed)
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 She has f/u planned with heme/onc for 10/2023.

## 2023-02-23 NOTE — Assessment & Plan Note (Addendum)
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  Reviewed with patient - she will continue daily aspirin and restart crestor 10mg  MWF.

## 2023-02-23 NOTE — Assessment & Plan Note (Signed)
She will continue calcium in diet and vit D supplementation and regular weight bearing exercise. Discussed update dexa 10/2025

## 2023-02-23 NOTE — Assessment & Plan Note (Signed)
BP stable off medication 

## 2023-02-23 NOTE — Assessment & Plan Note (Signed)
Chronic, stable on current regimen - continue this. Update TSH.

## 2023-02-23 NOTE — Assessment & Plan Note (Signed)
Continues seeing oncology regularly - appreciate their care.

## 2023-02-23 NOTE — Assessment & Plan Note (Signed)
Regularly followed by ophtho on xalatan eye drops. She will call for f/u appt

## 2023-02-23 NOTE — Assessment & Plan Note (Signed)
She took statin for 3 months then stopped as she felt legs weaken on statin. Reviewed marked improvement of cholesterol while on statin. She agrees to restart crestor 10mg  MWF dosing, will update me if recurrent leg symptoms. No muscle pain.  The ASCVD Risk score (Arnett DK, et al., 2019) failed to calculate for the following reasons:   The 2019 ASCVD risk score is only valid for ages 71 to 29

## 2023-05-20 DIAGNOSIS — Z961 Presence of intraocular lens: Secondary | ICD-10-CM | POA: Diagnosis not present

## 2023-05-20 DIAGNOSIS — H401132 Primary open-angle glaucoma, bilateral, moderate stage: Secondary | ICD-10-CM | POA: Diagnosis not present

## 2023-10-08 ENCOUNTER — Other Ambulatory Visit: Payer: Self-pay | Admitting: Hematology & Oncology

## 2023-10-20 ENCOUNTER — Telehealth: Payer: Self-pay

## 2023-10-20 NOTE — Telephone Encounter (Signed)
Received VM from pt questioning how often she is supposed to be donating blood. Per last office note here 12/2022, pt was instructed by PCP to donate d/t mild erythrocytosis. Per Maralyn Sago, [place] labs should be performed to determine. It appears patient was scheduled for lab/PA for tomorrow, however pt cancelled these appts and would call back to r/s.   Attempted to contact patient to encourage her to r/s appts for lab/PA for labs to be evaluated and treatment plan to be determined for the future. No answer/no opportunity to leave a message. dph

## 2023-10-21 ENCOUNTER — Inpatient Hospital Stay: Payer: Medicare HMO

## 2023-10-21 ENCOUNTER — Ambulatory Visit: Payer: Medicare HMO | Admitting: Medical Oncology

## 2023-11-17 DIAGNOSIS — Z1231 Encounter for screening mammogram for malignant neoplasm of breast: Secondary | ICD-10-CM | POA: Diagnosis not present

## 2023-11-17 LAB — HM MAMMOGRAPHY

## 2023-11-18 ENCOUNTER — Encounter: Payer: Self-pay | Admitting: Family Medicine

## 2023-11-19 DIAGNOSIS — H401133 Primary open-angle glaucoma, bilateral, severe stage: Secondary | ICD-10-CM | POA: Diagnosis not present

## 2023-11-19 DIAGNOSIS — Z961 Presence of intraocular lens: Secondary | ICD-10-CM | POA: Diagnosis not present

## 2023-11-19 DIAGNOSIS — H43811 Vitreous degeneration, right eye: Secondary | ICD-10-CM | POA: Diagnosis not present

## 2024-02-10 ENCOUNTER — Ambulatory Visit (INDEPENDENT_AMBULATORY_CARE_PROVIDER_SITE_OTHER): Payer: Medicare HMO

## 2024-02-10 VITALS — Ht 59.5 in | Wt 142.0 lb

## 2024-02-10 DIAGNOSIS — Z0000 Encounter for general adult medical examination without abnormal findings: Secondary | ICD-10-CM

## 2024-02-10 NOTE — Progress Notes (Signed)
 Please attest and cosign this visit due to patients primary care provider not being in the office at the time the visit was completed.    Subjective:   Joanna White is a 83 y.o. who presents for a Medicare Wellness preventive visit.  Visit Complete: Virtual I connected with  Joanna White on 02/10/24 by a audio enabled telemedicine application and verified that I am speaking with the correct person using two identifiers.  Patient Location: Home  Provider Location: Home Office  I discussed the limitations of evaluation and management by telemedicine. The patient expressed understanding and agreed to proceed.  Vital Signs: Because this visit was a virtual/telehealth visit, some criteria may be missing or patient reported. Any vitals not documented were not able to be obtained and vitals that have been documented are patient reported.  VideoDeclined- This patient declined Librarian, academic. Therefore the visit was completed with audio only.  AWV Questionnaire: No: Patient Medicare AWV questionnaire was not completed prior to this visit.  Cardiac Risk Factors include: advanced age (>28men, >79 women);dyslipidemia     Objective:    Today's Vitals   02/10/24 0813  Weight: 142 lb (64.4 kg)  Height: 4' 11.5" (1.511 m)   Body mass index is 28.2 kg/m.     02/10/2024    8:29 AM 02/04/2023    1:56 PM 01/26/2023   12:20 PM 01/08/2022    8:25 AM 10/22/2021   11:21 AM 01/07/2021   10:32 AM 10/22/2020    9:09 AM  Advanced Directives  Does Patient Have a Medical Advance Directive? Yes Yes No Yes Yes Yes No  Type of Estate agent of [place];Living will Healthcare Power of [place];Living will  Healthcare Power of [place];Living will Living will;Healthcare Power of [employer] Power of [place];Living will   Does patient want to make changes to medical advance directive?    Yes (MAU/Ambulatory/Procedural Areas - Information  given) No - Patient declined    Copy of Healthcare Power of Attorney in Chart? No - copy requested No - copy requested   No - copy requested No - copy requested   Would patient like information on creating a medical advance directive?   No - Patient declined    No - Patient declined    Current Medications (verified) Outpatient Encounter Medications as of 02/10/2024  Medication Sig   aspirin EC 81 MG tablet Take 1 tablet (81 mg total) by mouth daily. Swallow whole.   Cholecalciferol (VITAMIN D3) 1000 units CAPS Take 1 capsule (1,000 Units total) by mouth daily.   latanoprost (XALATAN) 0.005 % ophthalmic solution Place into both eyes at bedtime.    levothyroxine (SYNTHROID) 75 MCG tablet Take 1 tablet (75 mcg total) by mouth daily before breakfast.   Multiple Vitamin (MULTIVITAMIN) tablet Take 1 tablet by mouth daily. With calcium   OIL OF [place] PO Take by mouth.   rosuvastatin (CRESTOR) 10 MG tablet TAKE 1 TABLET BY MOUTH EVERY DAY   No facility-administered encounter medications on file as of 02/10/2024.    Allergies (verified) Flonase  [fluticasone propionate] and Fluticasone propionate   History: Past Medical History:  Diagnosis Date   Glaucoma    Hypothyroid    Osteopenia 08/2011   h/o osteoporosis, s/p boniva for 5 yrs   Personal history of malignant neoplasm of breast 1998   Left   Past Surgical History:  Procedure Laterality Date   BREAST LUMPECTOMY  1998   Left   CESAREAN SECTION  x 2   DEXA  10/2015   osteopenia, T -1.1 spine, hip -1.7    GYN surgery     uterine lining removed for heavy bleeding and polyp   lymph node removal  1998   left axilla   Thumb surgery     bilateral   TOTAL KNEE ARTHROPLASTY Left 2016   Joanna White)   Family History  Problem Relation Age of Onset   Diabetes Brother        x2   Cancer Father 38       lung   Cancer Mother 54       MM   Coronary artery disease Brother 34       CABG   Diabetes Brother    Diabetes Maternal  Grandmother    Vasculitis Nephew        wegener's vasculitis   Stroke Neg Hx    Social History   Socioeconomic History   Marital status: Widowed    Spouse name: Not on file   Number of children: 2   Years of education: Not on file   Highest education level: Not on file  Occupational History   Occupation: Retired  Tobacco Use   Smoking status: Never   Smokeless tobacco: Never  Vaping Use   Vaping status: Never Used  Substance and Sexual Activity   Alcohol use: No    Alcohol/week: 0.0 standard drinks of alcohol   Drug use: No   Sexual activity: Never  Other Topics Concern   Not on file  Social History Narrative   Caffeine: 2cups coffee/day   Widow-husband died of MI on operating table   2 children, grown   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   Social Drivers of Corporate investment banker Strain: Low Risk  (02/10/2024)   Overall Financial Resource Strain (CARDIA)    Difficulty of Paying Living Expenses: Not hard at all  Food Insecurity: No Food Insecurity (02/10/2024)   Hunger Vital Sign    Worried About Running Out of Food in the Last Year: Never true    Ran Out of Food in the Last Year: Never true  Transportation Needs: No Transportation Needs (02/10/2024)   PRAPARE - Administrator, Civil Service (Medical): No    Lack of Transportation (Non-Medical): No  Physical Activity: Sufficiently Active (02/10/2024)   Exercise Vital Sign    Days of Exercise per Week: 6 days    Minutes of Exercise per Session: 30 min  Stress: No Stress Concern Present (02/10/2024)   [employer] of Occupational Health - Occupational Stress Questionnaire    Feeling of Stress : Not at all  Social Connections: Moderately Isolated (02/10/2024)   Social Connection and Isolation Panel [NHANES]    Frequency of Communication with Friends and Family: More than three times a week    Frequency of Social Gatherings with Friends and  Family: More than three times a week    Attends Religious Services: More than 4 times per year    Active Member of [employer] or Organizations: No    Attends Banker Meetings: Never    Marital Status: Widowed    Tobacco Counseling Counseling given: Not Answered   Clinical Intake:  Pre-visit preparation completed: Yes  Pain : No/denies pain    BMI - recorded: 28.2 Nutritional Status: BMI 25 -29 Overweight Nutritional Risks: None Diabetes: No  How often do you need to have someone help  you when you read instructions, pamphlets, or other written materials from your doctor or pharmacy?: 1 - Never  Interpreter Needed?: No  Comments: lives alone Information entered by :: B.Jenene Kauffmann,LPN   Activities of Daily Living     02/10/2024    8:29 AM  In your present state of health, do you have any difficulty performing the following activities:  Hearing? 0  Vision? 0  Difficulty concentrating or making decisions? 0  Walking or climbing stairs? 0  Dressing or bathing? 0  Doing errands, shopping? 0  Preparing Food and eating ? N  Using the Toilet? N  In the past six months, have you accidently leaked urine? N  Do you have problems with loss of bowel control? N  Managing your Medications? N  Managing your Finances? N  Housekeeping or managing your Housekeeping? N    Patient Care Team: Eustaquio Boyden, MD as PCP - General Jethro Bolus, MD as Consulting Physician (Ophthalmology)  Indicate any recent Medical Services you may have received from other than [facility] providers in the past year (date may be approximate).     Assessment:   This is a routine wellness examination for [facility]  Hearing/Vision screen Hearing Screening - Comments:: Pt says her hearing is good Vision Screening - Comments:: Pt says her vision is good Dr Zetta Bills   Goals Addressed             This Visit's Progress    DIET - INCREASE WATER INTAKE   On track    02/10/24-I will continue to  drink at least 6-8 glasses of water daily.      Increase physical activity   On track    02/10/24-I will continue to exercise at least 30 min daily.      Patient Stated   On track    02/10/24-I will maintain and continue medications as prescribed.        Depression Screen     02/10/2024    8:26 AM 02/04/2023    1:55 PM 01/08/2022    8:28 AM 01/07/2021   10:34 AM 08/04/2019    8:33 AM 03/02/2018   10:19 AM 03/01/2017    9:32 AM  PHQ 2/9 Scores  PHQ - 2 Score 0 0 0 0 0 0 0  PHQ- 9 Score    0  0     Fall Risk     02/10/2024    8:16 AM 02/04/2023    1:57 PM 01/08/2022    8:27 AM 01/07/2021   10:33 AM 08/04/2019    8:33 AM  Fall Risk   Falls in the past year? 0 0 0 0 0  Number falls in past yr: 0 0 0 0   Injury with Fall? 0 0 0 0   Risk for fall due to : No Fall Risks No Fall Risks No Fall Risks Medication side effect;No Fall Risks   Follow up Education provided;Falls prevention discussed Falls prevention discussed;Falls evaluation completed Falls prevention discussed Falls evaluation completed;Falls prevention discussed     MEDICARE RISK AT HOME:  Medicare Risk at Home Any stairs in or around the home?: Yes If so, are there any without handrails?: Yes Home free of loose throw rugs in walkways, pet beds, electrical cords, etc?: Yes Adequate lighting in your home to reduce risk of falls?: Yes Life alert?: No Use of a cane, walker or w/c?: No Grab bars in the bathroom?: No Shower chair or bench in shower?: No Elevated toilet seat or a  handicapped toilet?: No  TIMED UP AND GO:  Was the test performed?  No  Cognitive Function: 6CIT completed    01/07/2021   10:37 AM 03/02/2018   10:19 AM 03/01/2017    9:22 AM 02/28/2016    9:35 AM  MMSE - Mini Mental State Exam  Orientation to time 5 5 5 5   Orientation to Place 5 5 5 5   Registration 3 3 3 3   Attention/ Calculation 5 0 0 0  Recall 3 3 3 3   Language- name 2 objects  0 0 0  Language- repeat 1 1 1 1   Language- follow 3 step command  3 3 3    Language- read & follow direction  0 0 0  Write a sentence  0 0 0  Copy design  0 0 0  Total score  20 20 20         02/10/2024    8:30 AM 02/04/2023    1:57 PM  6CIT Screen  What Year? 0 points 0 points  What month? 0 points 0 points  What time? 0 points 0 points  Count back from 20 0 points 0 points  Months in reverse 0 points 0 points  Repeat phrase 0 points 0 points  Total Score 0 points 0 points    Immunizations Immunization History  Administered Date(s) Administered   Fluad Quad(high Dose 65+) 08/04/2019, 09/15/2022, 12/16/2022   Influenza Whole 11/02/2007, 11/02/2012   Influenza, High Dose Seasonal PF 10/28/2020, 10/14/2021   Influenza,inj,Quad PF,6+ Mos 09/14/2013, 09/14/2014, 09/13/2015, 09/11/2016, 09/08/2017, 09/08/2018   PFIZER(Purple Top)SARS-COV-2 Vaccination 12/22/2019, 01/12/2020, 09/29/2020   Pneumococcal Conjugate-13 02/28/2016   Pneumococcal Polysaccharide-23 09/14/2014   Zoster, Live 12/01/2015    Screening Tests Health Maintenance  Topic Date Due   DTaP/Tdap/Td (1 - Tdap) Never done   Zoster Vaccines- Shingrix (1 of 2) 09/21/1990   INFLUENZA VACCINE  07/01/2023   COVID-19 Vaccine (4 - 2024-25 season) 08/01/2023   MAMMOGRAM  11/16/2024   Medicare Annual Wellness (AWV)  02/09/2025   Pneumonia Vaccine 38+ Years old  Completed   DEXA SCAN  Completed   HPV VACCINES  Aged Out   Hepatitis C Screening  Discontinued    Health Maintenance  Health Maintenance Due  Topic Date Due   DTaP/Tdap/Td (1 - Tdap) Never done   Zoster Vaccines- Shingrix (1 of 2) 09/21/1990   INFLUENZA VACCINE  07/01/2023   COVID-19 Vaccine (4 - 2024-25 season) 08/01/2023   Health Maintenance Items Addressed: None needed  Additional Screening:  Vision Screening: Recommended annual ophthalmology exams for early detection of glaucoma and other disorders of the eye.  Dental Screening: Recommended annual dental exams for proper oral hygiene  Community Resource Referral /  Chronic Care Management: CRR required this visit?  No   CCM required this visit?  No    Plan:     I have personally reviewed and noted the following in the patient's chart:   Medical and social history Use of alcohol, tobacco or illicit drugs  Current medications and supplements including opioid prescriptions. Patient is not currently taking opioid prescriptions. Functional ability and status Nutritional status Physical activity Advanced directives List of other physicians Hospitalizations, surgeries, and ER visits in previous 12 months Vitals Screenings to include cognitive, depression, and falls Referrals and appointments  In addition, I have reviewed and discussed with patient certain preventive protocols, quality metrics, and best practice recommendations. A written personalized care plan for preventive services as well as general preventive health recommendations were provided to  patient.    Sue Lush, LPN   04/27/4131   After Visit Summary: (Declined) Due to this being a telephonic visit, with patients personalized plan was offered to patient but patient Declined AVS at this time   Notes: Nothing significant to report at this time.

## 2024-02-10 NOTE — Patient Instructions (Signed)
 Ms. Galeas , Thank you for taking time to come for your Medicare Wellness Visit. I appreciate your ongoing commitment to your health goals. Please review the following plan we discussed and let me know if I can assist you in the future.   Referrals/Orders/Follow-Ups/Clinician Recommendations: none  This is a list of the screening recommended for you and due dates:  Health Maintenance  Topic Date Due   DTaP/Tdap/Td vaccine (1 - Tdap) Never done   Zoster (Shingles) Vaccine (1 of 2) 09/21/1990   Flu Shot  07/01/2023   COVID-19 Vaccine (4 - 2024-25 season) 08/01/2023   Mammogram  11/16/2024   Medicare Annual Wellness Visit  02/09/2025   Pneumonia Vaccine  Completed   DEXA scan (bone density measurement)  Completed   HPV Vaccine  Aged Out   Hepatitis C Screening  Discontinued    Advanced directives: (Copy Requested) Please bring a copy of your health care power of attorney and living will to the office to be added to your chart at your convenience. You can mail to [facility] [address] [place], [place] [address] or email to [email or web address]  Next Medicare Annual Wellness Visit scheduled for next year: Yes 02/13/24 @ 8:10am televisit

## 2024-02-25 ENCOUNTER — Encounter: Payer: Self-pay | Admitting: Family Medicine

## 2024-02-25 ENCOUNTER — Ambulatory Visit (INDEPENDENT_AMBULATORY_CARE_PROVIDER_SITE_OTHER): Payer: Medicare HMO | Admitting: Family Medicine

## 2024-02-25 VITALS — BP 127/63 | HR 87 | Temp 98.2°F | Ht 59.5 in | Wt 146.4 lb

## 2024-02-25 DIAGNOSIS — Z8673 Personal history of transient ischemic attack (TIA), and cerebral infarction without residual deficits: Secondary | ICD-10-CM

## 2024-02-25 DIAGNOSIS — D751 Secondary polycythemia: Secondary | ICD-10-CM | POA: Diagnosis not present

## 2024-02-25 DIAGNOSIS — Z853 Personal history of malignant neoplasm of breast: Secondary | ICD-10-CM | POA: Diagnosis not present

## 2024-02-25 DIAGNOSIS — E785 Hyperlipidemia, unspecified: Secondary | ICD-10-CM | POA: Diagnosis not present

## 2024-02-25 DIAGNOSIS — R030 Elevated blood-pressure reading, without diagnosis of hypertension: Secondary | ICD-10-CM | POA: Diagnosis not present

## 2024-02-25 DIAGNOSIS — Z0000 Encounter for general adult medical examination without abnormal findings: Secondary | ICD-10-CM | POA: Diagnosis not present

## 2024-02-25 DIAGNOSIS — E039 Hypothyroidism, unspecified: Secondary | ICD-10-CM

## 2024-02-25 DIAGNOSIS — H401132 Primary open-angle glaucoma, bilateral, moderate stage: Secondary | ICD-10-CM

## 2024-02-25 DIAGNOSIS — R200 Anesthesia of skin: Secondary | ICD-10-CM | POA: Diagnosis not present

## 2024-02-25 DIAGNOSIS — Z7189 Other specified counseling: Secondary | ICD-10-CM

## 2024-02-25 DIAGNOSIS — M85852 Other specified disorders of bone density and structure, left thigh: Secondary | ICD-10-CM | POA: Diagnosis not present

## 2024-02-25 LAB — CBC WITH DIFFERENTIAL/PLATELET
Basophils Absolute: 0.1 10*3/uL (ref 0.0–0.1)
Basophils Relative: 0.9 % (ref 0.0–3.0)
Eosinophils Absolute: 0.4 10*3/uL (ref 0.0–0.7)
Eosinophils Relative: 5.1 % — ABNORMAL HIGH (ref 0.0–5.0)
HCT: 46.2 % — ABNORMAL HIGH (ref 36.0–46.0)
Hemoglobin: 15.2 g/dL — ABNORMAL HIGH (ref 12.0–15.0)
Lymphocytes Relative: 29.8 % (ref 12.0–46.0)
Lymphs Abs: 2.4 10*3/uL (ref 0.7–4.0)
MCHC: 32.8 g/dL (ref 30.0–36.0)
MCV: 91.1 fl (ref 78.0–100.0)
Monocytes Absolute: 0.7 10*3/uL (ref 0.1–1.0)
Monocytes Relative: 9.1 % (ref 3.0–12.0)
Neutro Abs: 4.5 10*3/uL (ref 1.4–7.7)
Neutrophils Relative %: 55.1 % (ref 43.0–77.0)
Platelets: 199 10*3/uL (ref 150.0–400.0)
RBC: 5.08 Mil/uL (ref 3.87–5.11)
RDW: 14 % (ref 11.5–15.5)
WBC: 8.1 10*3/uL (ref 4.0–10.5)

## 2024-02-25 LAB — COMPREHENSIVE METABOLIC PANEL WITH GFR
ALT: 12 U/L (ref 0–35)
AST: 17 U/L (ref 0–37)
Albumin: 4.4 g/dL (ref 3.5–5.2)
Alkaline Phosphatase: 46 U/L (ref 39–117)
BUN: 19 mg/dL (ref 6–23)
CO2: 30 meq/L (ref 19–32)
Calcium: 9.7 mg/dL (ref 8.4–10.5)
Chloride: 105 meq/L (ref 96–112)
Creatinine, Ser: 0.83 mg/dL (ref 0.40–1.20)
GFR: 65.19 mL/min (ref >60.00)
Glucose, Bld: 136 mg/dL — ABNORMAL HIGH (ref 70–99)
Potassium: 4.5 meq/L (ref 3.5–5.1)
Sodium: 142 meq/L (ref 135–145)
Total Bilirubin: 0.7 mg/dL (ref 0.2–1.2)
Total Protein: 7.1 g/dL (ref 6.0–8.3)

## 2024-02-25 LAB — LIPID PANEL
Cholesterol: 141 mg/dL (ref 0–200)
HDL: 47.8 mg/dL (ref >39.00)
LDL Cholesterol: 70 mg/dL (ref 0–99)
NonHDL: 93.26
Total CHOL/HDL Ratio: 3
Triglycerides: 115 mg/dL (ref 0.0–149.0)
VLDL: 23 mg/dL (ref 0.0–40.0)

## 2024-02-25 LAB — VITAMIN D 25 HYDROXY (VIT D DEFICIENCY, FRACTURES): VITD: 49.03 ng/mL (ref 30.00–100.00)

## 2024-02-25 LAB — TSH: TSH: 0.73 u[IU]/mL (ref 0.35–5.50)

## 2024-02-25 LAB — VITAMIN B12: Vitamin B-12: 456 pg/mL (ref 211–911)

## 2024-02-25 MED ORDER — LEVOTHYROXINE SODIUM 75 MCG PO TABS: 75.0000 ug | ORAL_TABLET | Freq: Every day | ORAL | 4 refills | Status: AC

## 2024-02-25 NOTE — Assessment & Plan Note (Addendum)
Advanced directive discussion - has this at home, has talked to children who are aware of her wishes. Does not want prolonged life support if terminal condition, would be ok with temporary measures. Does not want to be cremated. Brother had prolonged illness in acute care facility and passed away 10/14/20. HCPOA would be 2 children. Asked to bring [place] copy

## 2024-02-25 NOTE — Assessment & Plan Note (Signed)
 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 Will update CBC today.

## 2024-02-25 NOTE — Assessment & Plan Note (Addendum)
 She continues crestor 10mg  MWF in h/o CVA ~06/2022.  The ASCVD Risk score (Arnett DK, et al., 2019) failed to calculate for the following reasons:   The 2019 ASCVD risk score is only valid for ages 49 to 19

## 2024-02-25 NOTE — Assessment & Plan Note (Signed)
 Continue seeing ophtho.

## 2024-02-25 NOTE — Assessment & Plan Note (Signed)
 Preventative protocols reviewed and updated unless pt declined. Discussed healthy diet and lifestyle.

## 2024-02-25 NOTE — Assessment & Plan Note (Signed)
 Continue yearly mammo, overdue for onc f/u

## 2024-02-25 NOTE — Patient Instructions (Addendum)
 Labs today  Call to schedule follow up with Dr Myna Hidalgo for elevated blood counts (hemoglobin).  If interested, check with pharmacy about new 2 shot shingles series (shingrix).  Good to see you today  Return as needed or in 1 year for next physical.

## 2024-02-25 NOTE — Assessment & Plan Note (Signed)
 She continues aspirin and crestor.

## 2024-02-25 NOTE — Assessment & Plan Note (Signed)
 Consider updated DEXA 10/2025.

## 2024-02-25 NOTE — Progress Notes (Signed)
 Ph: [phone] Fax: [phone]   Patient ID: Joanna White, female    DOB: 03-13-1941, 83 y.o.   MRN: [id number]  This visit was conducted in person.  BP 127/63 Comment: last check at home  Pulse 87   Temp 98.2 F (36.8 C) (Oral)   Ht 4' 11.5" (1.511 m)   Wt 146 lb 6 oz (66.4 kg)   SpO2 96%   BMI 29.07 kg/m   BP Readings from Last 3 Encounters:  02/25/24 127/63  02/22/23 138/80  01/26/23 (!) 151/66  BP elevated in office today. Home readings largely well controlled, reported at 120s/60s. Advised to let us know if she develops elevated home readings.   CC: CPE Subjective:   HPI: Joanna White is a 83 y.o. female presenting on 02/25/2024 for Annual Exam (MCR prt 2 [AWV- 02/10/24]. )   Saw health advisor earlier this month for medicare wellness visit. Note reviewed.   No results found.  Flowsheet Row Clinical Support from 02/10/2024 in [facility] [place]  PHQ-2 Total Score 0          02/10/2024    8:16 AM 02/04/2023    1:57 PM 01/08/2022    8:27 AM 01/07/2021   10:33 AM 08/04/2019    8:33 AM  Fall Risk   Falls in the past year? 0 0 0 0 0  Number falls in past yr: 0 0 0 0   Injury with Fall? 0 0 0 0   Risk for fall due to : No Fall Risks No Fall Risks No Fall Risks Medication side effect;No Fall Risks   Follow up Education provided;Falls prevention discussed Falls prevention discussed;Falls evaluation completed Falls prevention discussed Falls evaluation completed;Falls prevention discussed    BP elevated in office today - off antihypertensive medication. Has white coat hypertension. Home BP readings overall well controlled, latest home reading 127/63 (1 month ago).  H/o stroke likely 06/2022 - MRI 10/2022 showed remote R lacunar stroke ot corona radiata. Notes chronic residual L distal extremity numbness since then.  Overdue for oncology yearly f/u for h/o stage IIA L breast ductal cancer (~25 yrs ago).   H/o mild polycythemia -  she donated blood once with benefit.  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   Preventative: Colon cancer screening - cologuard 02/2016 and 08/2019 WNL. Age out - no blood in stool.  Well woman exam - aged out. No vaginal bleeding.  Mammo 10/2023 - Birads1 @ [facility]. H/o stage IIA ductal carcinoma L breast 1998 s/p adjuvant chemo (Ennever).  DEXA 10/2015 osteopenia, T -1.1 spine, hip -1.7 DEXA 03/2018 T -1.5 hip, -0.2 spine (improvement) .  DEXA 10/2020 - T -2.2 L femur neck, -0.4 spine. Drinks 1 gallon milk/wk and she walks regularly.  Lung cancer screening - never smoker  Flu shot yearly  COVID vaccine Pfizer 12/2019, 01/2020, booster 10/02/20  Tetanus shot - unsure  Pneumovax 2015, Prevnar-13 2017 Zostavax - 2017 Shingrix - discussed, to consider at pharmacy  Advanced directive discussion - has this at home, has talked to children who are aware of her wishes. Does not want prolonged life support if terminal condition, would be ok with temporary measures. Does not want to be cremated. Brother had prolonged illness in acute care facility and passed away Oct 02, 2020. HCPOA would be 2 children. Asked to bring [place] copy Seat belt use discussed  Sunscreen use discussed, no changing moles on skin Non smoker  Alcohol - none  Dentist q6 mo  Eye exam seen q3 mo for glaucoma Nile Riggs --> [place]). She had bilateral cataract surgery.  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 Interim medical history since our last  visit reviewed. Allergies and medications reviewed and updated. Outpatient Medications Prior to Visit  Medication Sig Dispense Refill   aspirin EC 81 MG tablet Take 1 tablet (81 mg total) by mouth daily. Swallow whole.     Cholecalciferol (VITAMIN D3) 1000 units CAPS Take 1 capsule (1,000 Units total) by mouth daily. 30 capsule    latanoprost (XALATAN) 0.005 % ophthalmic solution Place into both eyes at bedtime.      Multiple Vitamin (MULTIVITAMIN) tablet Take 1 tablet by mouth daily. With calcium     OIL OF OREGANO PO Take by mouth.     rosuvastatin (CRESTOR) 10 MG tablet TAKE 1 TABLET BY MOUTH EVERY DAY 90 tablet 4   levothyroxine (SYNTHROID) 75 MCG tablet Take 1 tablet (75 mcg total) by mouth daily before breakfast. 90 tablet 4   No facility-administered medications prior to visit.     Per HPI unless specifically indicated in ROS section below Review of Systems  Constitutional:  Negative for activity change, appetite change, chills, fatigue, fever and unexpected weight change.       No night sweats or unexpected weight changes  HENT:  Negative for hearing loss.   Eyes:  Negative for visual disturbance.  Respiratory:  Negative for cough, chest tightness, shortness of breath and wheezing.   Cardiovascular:  Negative for chest pain, palpitations and leg swelling.  Gastrointestinal:  Negative for abdominal distention, abdominal pain, blood in stool, constipation, diarrhea, nausea and vomiting.  Genitourinary:  Negative for difficulty urinating and hematuria.  Musculoskeletal:  Negative for arthralgias, myalgias and neck pain.  Skin:  Negative for rash.  Neurological:  Negative for dizziness, seizures, syncope and headaches.  Hematological:  Negative for adenopathy. Does not bruise/bleed easily.  Psychiatric/Behavioral:  Negative for dysphoric mood. The patient is not nervous/anxious.     Objective:  BP 127/63 Comment: last check at home  Pulse 87   Temp 98.2 F (36.8 C) (Oral)    Ht 4' 11.5" (1.511 m)   Wt 146 lb 6 oz (66.4 kg)   SpO2 96%   BMI 29.07 kg/m   Wt Readings from Last 3 Encounters:  02/25/24 146 lb 6 oz (66.4 kg)  02/10/24 142 lb (64.4 kg)  02/22/23 145 lb 4 oz (65.9 kg)      Physical Exam Vitals and nursing note reviewed.  Constitutional:      Appearance: Normal appearance. She is not ill-appearing.  HENT:     Head: Normocephalic and atraumatic.     Right Ear: Tympanic membrane, ear canal and external ear normal. There is no impacted cerumen.     Left Ear: Tympanic membrane, ear canal and external ear normal. There is no impacted cerumen.     Mouth/Throat:     Mouth: Mucous membranes are moist.     Pharynx: Oropharynx is clear. No oropharyngeal exudate or posterior  oropharyngeal erythema.  Eyes:     General:        Right eye: No discharge.        Left eye: No discharge.     Extraocular Movements: Extraocular movements intact.     Conjunctiva/sclera: Conjunctivae normal.     Pupils: Pupils are equal, round, and reactive to light.  Neck:     Thyroid: No thyroid mass or thyromegaly.     Vascular: No carotid bruit.  Cardiovascular:     Rate and Rhythm: Normal rate and regular rhythm.     Pulses: Normal pulses.     Heart sounds: Normal heart sounds. No murmur heard. Pulmonary:     Effort: Pulmonary effort is normal. No respiratory distress.     Breath sounds: Normal breath sounds. No wheezing, rhonchi or rales.  Abdominal:     General: Bowel sounds are normal. There is no distension.     Palpations: Abdomen is soft. There is no mass.     Tenderness: There is no abdominal tenderness. There is no guarding or rebound.     Hernia: No hernia is present.  Musculoskeletal:     Cervical back: Normal range of motion and neck supple. No rigidity.     Right lower leg: No edema.     Left lower leg: No edema.  Lymphadenopathy:     Cervical: No cervical adenopathy.  Skin:    General: Skin is warm and dry.     Findings: No rash.  Neurological:      General: No focal deficit present.     Mental Status: She is alert. Mental status is at baseline.  Psychiatric:        Mood and Affect: Mood normal.        Behavior: Behavior normal.       Results for orders placed or performed in visit on 11/18/23  HM MAMMOGRAPHY   Collection Time: 11/17/23 12:07 PM  Result Value Ref Range   HM Mammogram 0-4 Bi-Rad 0-4 Bi-Rad, Self Reported Normal    Assessment & Plan:   Problem List Items Addressed This Visit     Advanced care planning/counseling discussion (Chronic)   Advanced directive discussion - has this at home, has talked to children who are aware of her wishes. Does not want prolonged life support if terminal condition, would be ok with temporary measures. Does not want to be cremated. Brother had prolonged illness in acute care facility and passed away October 04, 2020. HCPOA would be 2 children. Asked to bring [place] copy      Health maintenance examination - Primary (Chronic)   Preventative protocols reviewed and updated unless pt declined. Discussed healthy diet and lifestyle.       Hypothyroidism   Chronic, stable on levothyroxine daily - update TSH       Relevant Medications   levothyroxine (SYNTHROID) 75 MCG tablet   Other Relevant Orders   TSH   Osteopenia   Consider updated DEXA 10/2025.       Relevant Orders   VITAMIN D 25 Hydroxy (Vit-D Deficiency, Fractures)   History of left breast cancer   Continue yearly mammo, overdue for onc f/u       Dyslipidemia   She continues crestor 10mg  MWF in h/o CVA ~06/2022.  The ASCVD Risk score (Arnett DK, et al., 2019) failed to calculate for the following reasons:   The 2019 ASCVD risk score is only valid for ages 62 to 75       Relevant Orders  Lipid panel   Comprehensive metabolic panel with GFR   Primary open angle glaucoma of both eyes, moderate stage   Continue seeing ophtho.       White coat syndrome without diagnosis of hypertension   Anticipate white coat  hypertension.  Will continue managing based on home BP readings.      Numbness on left side   H/o numbness to entire left side of body that started 07/26/2022.  Residual numbness to ulnar L hand and below left calf into foot, presumed from stroke as MRI 10/2022 showed chronic lacunar stroke to R corona radiata Check B12, TSH.       Relevant Orders   Vitamin B12   Polycythemia   H/o polycythemia, levels improved on repeat.  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 Will update CBC today.       Relevant Orders   CBC with Differential/Platelet   History of stroke   She continues aspirin and crestor.         Meds ordered this encounter  Medications   levothyroxine (SYNTHROID) 75 MCG tablet    Sig: Take 1 tablet (75 mcg total) by mouth daily before breakfast.    Dispense:  90 tablet    Refill:  4    Orders Placed This Encounter  Procedures   Lipid panel   Comprehensive metabolic panel with GFR   TSH   VITAMIN D 25 Hydroxy (Vit-D Deficiency, Fractures)   Vitamin B12   CBC with Differential/Platelet    Patient Instructions  Labs today  Call to schedule follow up with Dr Myna Hidalgo for elevated blood counts (hemoglobin).  If interested, check with pharmacy about new 2 shot shingles series (shingrix).  Good to see you today  Return as needed or in 1 year for next physical.   Follow up plan: Return in about 1 year (around 02/24/2025) for annual exam, prior fasting for blood work.  Eustaquio Boyden, MD

## 2024-02-25 NOTE — Assessment & Plan Note (Addendum)
 H/o numbness to entire left side of body that started 07/26/2022.  Residual numbness to ulnar L hand and below left calf into foot, presumed from stroke as MRI 10/2022 showed chronic lacunar stroke to R corona radiata Check B12, TSH.

## 2024-02-25 NOTE — Assessment & Plan Note (Signed)
 Chronic, stable on levothyroxine daily - update TSH

## 2024-02-25 NOTE — Assessment & Plan Note (Signed)
 Anticipate white coat hypertension.  Will continue managing based on home BP readings.

## 2024-03-01 ENCOUNTER — Telehealth: Payer: Self-pay

## 2024-03-01 NOTE — Telephone Encounter (Signed)
 Received phone call from patient stating that her PCP told her that her hemoglobin was high and if she needed to donate blood. Pt educated that she hasn't been seen by a provider in over a year at this office and needs to be seen as that advice can't be given out over the phone.  Pt states she just needs to know if her hemoglobin needs a donation. Pt educated that her PCP note stated she needed an appointment with this office to follow up on her lab results. Pt verbalized understanding. Pt transferred to scheduling to get an appointment. Pt had no further needs identified.

## 2024-03-09 ENCOUNTER — Inpatient Hospital Stay (HOSPITAL_BASED_OUTPATIENT_CLINIC_OR_DEPARTMENT_OTHER): Admitting: Hematology & Oncology

## 2024-03-09 ENCOUNTER — Inpatient Hospital Stay: Attending: Hematology & Oncology

## 2024-03-09 ENCOUNTER — Other Ambulatory Visit: Payer: Self-pay

## 2024-03-09 DIAGNOSIS — I259 Chronic ischemic heart disease, unspecified: Secondary | ICD-10-CM | POA: Insufficient documentation

## 2024-03-09 DIAGNOSIS — D750 Familial erythrocytosis: Secondary | ICD-10-CM | POA: Diagnosis not present

## 2024-03-09 DIAGNOSIS — Z79899 Other long term (current) drug therapy: Secondary | ICD-10-CM | POA: Insufficient documentation

## 2024-03-09 DIAGNOSIS — E039 Hypothyroidism, unspecified: Secondary | ICD-10-CM

## 2024-03-09 DIAGNOSIS — Z7982 Long term (current) use of aspirin: Secondary | ICD-10-CM | POA: Insufficient documentation

## 2024-03-09 DIAGNOSIS — R200 Anesthesia of skin: Secondary | ICD-10-CM

## 2024-03-09 DIAGNOSIS — Z853 Personal history of malignant neoplasm of breast: Secondary | ICD-10-CM | POA: Insufficient documentation

## 2024-03-09 DIAGNOSIS — R531 Weakness: Secondary | ICD-10-CM | POA: Insufficient documentation

## 2024-03-09 DIAGNOSIS — D751 Secondary polycythemia: Secondary | ICD-10-CM | POA: Insufficient documentation

## 2024-03-09 LAB — CBC WITH DIFFERENTIAL (CANCER CENTER ONLY)
Abs Immature Granulocytes: 0.03 10*3/uL (ref 0.00–0.07)
Basophils Absolute: 0.1 10*3/uL (ref 0.0–0.1)
Basophils Relative: 1 %
Eosinophils Absolute: 0.4 10*3/uL (ref 0.0–0.5)
Eosinophils Relative: 4 %
HCT: 46.8 % — ABNORMAL HIGH (ref 36.0–46.0)
Hemoglobin: 15.5 g/dL — ABNORMAL HIGH (ref 12.0–15.0)
Immature Granulocytes: 0 %
Lymphocytes Relative: 30 %
Lymphs Abs: 3 10*3/uL (ref 0.7–4.0)
MCH: 30 pg (ref 26.0–34.0)
MCHC: 33.1 g/dL (ref 30.0–36.0)
MCV: 90.7 fL (ref 80.0–100.0)
Monocytes Absolute: 1.1 10*3/uL — ABNORMAL HIGH (ref 0.1–1.0)
Monocytes Relative: 11 %
Neutro Abs: 5.6 10*3/uL (ref 1.7–7.7)
Neutrophils Relative %: 54 %
Platelet Count: 241 10*3/uL (ref 150–400)
RBC: 5.16 MIL/uL — ABNORMAL HIGH (ref 3.87–5.11)
RDW: 13.5 % (ref 11.5–15.5)
WBC Count: 10.2 10*3/uL (ref 4.0–10.5)
nRBC: 0 % (ref 0.0–0.2)

## 2024-03-09 LAB — CMP (CANCER CENTER ONLY)
ALT: 12 U/L (ref 0–44)
AST: 19 U/L (ref 15–41)
Albumin: 4.5 g/dL (ref 3.5–5.0)
Alkaline Phosphatase: 50 U/L (ref 38–126)
Anion gap: 10 (ref 5–15)
BUN: 16 mg/dL (ref 8–23)
CO2: 26 mmol/L (ref 22–32)
Calcium: 9.9 mg/dL (ref 8.9–10.3)
Chloride: 103 mmol/L (ref 98–111)
Creatinine: 1.09 mg/dL — ABNORMAL HIGH (ref 0.44–1.00)
GFR, Estimated: 50 mL/min — ABNORMAL LOW (ref >60)
Glucose, Bld: 99 mg/dL (ref 70–99)
Potassium: 4.3 mmol/L (ref 3.5–5.1)
Sodium: 139 mmol/L (ref 135–145)
Total Bilirubin: 0.8 mg/dL (ref 0.0–1.2)
Total Protein: 7.4 g/dL (ref 6.5–8.1)

## 2024-03-09 LAB — FERRITIN: Ferritin: 60 ng/mL (ref 11–307)

## 2024-03-09 LAB — TSH: TSH: 0.899 u[IU]/mL (ref 0.350–4.500)

## 2024-03-09 LAB — SAVE SMEAR(SSMR), FOR PROVIDER SLIDE REVIEW

## 2024-03-09 NOTE — Progress Notes (Unsigned)
 Hematology and Oncology Follow Up Visit  Joanna White [id number] 06-17-1941 83 y.o. 03/09/2024   Principle Diagnosis:  Stage IIA (T2 N0 M0) ductal carcinoma of the left breast.    Current Therapy:        Observation   Interim History:  Joanna White is here today for follow-up.  This is an early follow-up for her.  Apparently, she saw her family doctor.  He told her to see [place].  She is still having some issues with numbness in the 4th and 5th digits of the left hand.  I know that she has had an MRI in the past which was a year and a half ago.  She does have chronic ischemic changes.  It probably would be worthwhile getting another MRI to see if there is any cognitive changes.  She is on aspirin.  She is on Crestor.  She is also worried about her hemoglobin being too high.  She has mild erythrocytosis.  I told her that she could certainly donate blood every couple months.  I do not see a problem with this.  We did send off a JAK2 assay on her a while back.  This did not show a JAK2 mutation or a Calreticulin mutation.  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  This can certainly play a role.  She has had no cough.  She has had no problems with COVID or in Influenza..  There is no change in bowel or bladder habits.  She has had no rashes.  There has been no leg swelling.  She has had no visual changes.  Her metabolic panel looks good.  Her CBC shows white cell count 10.2.  Hemoglobin 15.5.  Platelet count 241,000.  Her appetite has been okay.  Overall, I would say that her performance status is probably ECOG 1.  Medications:  Allergies as of 03/09/2024       Reactions   Flonase  [fluticasone Propionate] Anaphylaxis   Fluticasone Propionate Other (See Comments)   REACTION: throat tight        Medication List        Accurate as of March 09, 2024  3:57 PM. If you have any questions, ask your nurse or doctor.          aspirin EC 81 MG tablet Take 1 tablet (81 mg total) by  mouth daily. Swallow whole.   latanoprost 0.005 % ophthalmic solution Commonly known as: XALATAN Place into both eyes at bedtime.   levothyroxine 75 MCG tablet Commonly known as: SYNTHROID Take 1 tablet (75 mcg total) by mouth daily before breakfast.   multivitamin tablet Take 1 tablet by mouth daily. With calcium   OIL OF OREGANO PO Take by mouth.   rosuvastatin 10 MG tablet Commonly known as: CRESTOR TAKE 1 TABLET BY MOUTH EVERY DAY   Vitamin D3 25 MCG (1000 UT) Caps Take 1 capsule (1,000 Units total) by mouth daily.        Allergies:  Allergies  Allergen Reactions   Flonase  [Fluticasone Propionate] Anaphylaxis   Fluticasone Propionate Other (See Comments)    REACTION: throat tight    Past Medical History, Surgical history, Social history, and Family History were reviewed and updated.  Review of Systems: Review of Systems  Constitutional: Negative.   HENT: Negative.    Eyes: Negative.   Respiratory: Negative.    Cardiovascular: Negative.   Gastrointestinal: Negative.   Genitourinary: Negative.   Musculoskeletal: Negative.   Skin: Negative.   Neurological:  Positive for tingling, sensory change and focal weakness.  Endo/Heme/Allergies: Negative.   Psychiatric/Behavioral: Negative.       Physical Exam:  height is 4' 11.5" (1.511 m) (pended) and weight is 146 lb (66.2 kg) (pended). Her oral temperature is 98.3 F (36.8 C) (pended). Her blood pressure is 138/58 (abnormal, pended) and her pulse is 82 (pended). Her respiration is 16 (pended) and oxygen saturation is 96% (pended).   Wt Readings from Last 3 Encounters:  03/09/24 (P) 146 lb (66.2 kg)  02/25/24 146 lb 6 oz (66.4 kg)  02/10/24 142 lb (64.4 kg)    Physical Exam Vitals reviewed.  HENT:     Head: Normocephalic and atraumatic.  Eyes:     Pupils: Pupils are equal, round, and reactive to light.  Cardiovascular:     Rate and Rhythm: Normal rate and regular rhythm.     Heart sounds: Normal  heart sounds.  Pulmonary:     Effort: Pulmonary effort is normal.     Breath sounds: Normal breath sounds.  Musculoskeletal:        General: No tenderness or deformity. Normal range of motion.     Cervical back: Normal range of motion.  Lymphadenopathy:     Cervical: No cervical adenopathy.  Skin:    General: Skin is warm and dry.     Findings: No erythema or rash.  Neurological:     Mental Status: She is alert and oriented to person, place, and time.  Psychiatric:        Behavior: Behavior normal.        Thought Content: Thought content normal.        Judgment: Judgment normal.     Lab Results  Component Value Date   WBC 10.2 03/09/2024   HGB 15.5 (H) 03/09/2024   HCT 46.8 (H) 03/09/2024   MCV 90.7 03/09/2024   PLT 241 03/09/2024   Lab Results  Component Value Date   FERRITIN 44 01/26/2023   IRON 73 01/26/2023   TIBC 444 01/26/2023   UIBC 371 01/26/2023   IRONPCTSAT 16 01/26/2023   Lab Results  Component Value Date   RBC 5.16 (H) 03/09/2024   No results found for: "KPAFRELGTCHN", "LAMBDASER", "KAPLAMBRATIO" No results found for: "IGGSERUM", "IGA", "IGMSERUM" No results found for: "TOTALPROTELP", "ALBUMINELP", "A1GS", "A2GS", "BETS", "BETA2SER", "GAMS", "MSPIKE", "SPEI"   Chemistry      Component Value Date/Time   NA 139 03/09/2024 1500   NA 142 09/08/2017 1028   K 4.3 03/09/2024 1500   K 4.5 09/08/2017 1028   CL 103 03/09/2024 1500   CL 103 09/14/2014 1111   CO2 26 03/09/2024 1500   CO2 26 09/08/2017 1028   BUN 16 03/09/2024 1500   BUN 18.9 09/08/2017 1028   CREATININE 1.09 (H) 03/09/2024 1500   CREATININE 1.2 (H) 09/08/2017 1028      Component Value Date/Time   CALCIUM 9.9 03/09/2024 1500   CALCIUM 9.8 09/08/2017 1028   ALKPHOS 50 03/09/2024 1500   ALKPHOS 57 09/08/2017 1028   AST 19 03/09/2024 1500   AST 21 09/08/2017 1028   ALT 12 03/09/2024 1500   ALT 15 09/08/2017 1028   BILITOT 0.8 03/09/2024 1500   BILITOT 0.57 09/08/2017 1028        Impression and Plan: Joanna White is a very pleasant 83 yo white female with history of stage IIA ductal carcinoma of the left breast treated with adjuvant chemotherapy with [facility]. Her tumor was ER negative. She is now out over  25 years.  We will go ahead and get an MRI of the brain.  I suspect that she probably will need to go to Neurology.  Again I told her she could certainly donate blood.  I do not see a problem with this.  It is hard to say whether or not her erythrocytosis has any factor with respect to her neurological issues.  We will have to plan to get her back to see [place] in another month.  What we may have to do that we may have to do phlebotomies on her in the office to make sure we keep her hematocrit below 45%.  As always, it is a lot of fun to talk to Ms. Clinton Sawyer.  She is always so interesting and is really trying to stay active.   Josph Macho, MD 4/10/20253:57 PM

## 2024-03-10 LAB — IRON AND IRON BINDING CAPACITY (CC-WL,HP ONLY)
Iron: 96 ug/dL (ref 28–170)
Saturation Ratios: 23 % (ref 10.4–31.8)
TIBC: 417 ug/dL (ref 250–450)
UIBC: 321 ug/dL (ref 148–442)

## 2024-03-13 ENCOUNTER — Telehealth: Payer: Self-pay

## 2024-03-13 NOTE — Telephone Encounter (Signed)
Called and LM informing patient of lab results (ok per DPR). Requested a CB with any questions or concerns.

## 2024-03-13 NOTE — Telephone Encounter (Signed)
-----   Message from [name] sent at 03/10/2024  4:18 PM EDT ----- Please call and let her know that the iron studies and thyroid are all okay.  Thanks.  Twilla Galea

## 2024-03-14 ENCOUNTER — Encounter: Payer: Self-pay | Admitting: Neurology

## 2024-03-14 ENCOUNTER — Other Ambulatory Visit: Payer: Self-pay

## 2024-03-14 DIAGNOSIS — R29898 Other symptoms and signs involving the musculoskeletal system: Secondary | ICD-10-CM

## 2024-03-14 DIAGNOSIS — R200 Anesthesia of skin: Secondary | ICD-10-CM

## 2024-03-24 DIAGNOSIS — H43811 Vitreous degeneration, right eye: Secondary | ICD-10-CM | POA: Diagnosis not present

## 2024-03-24 DIAGNOSIS — Z961 Presence of intraocular lens: Secondary | ICD-10-CM | POA: Diagnosis not present

## 2024-03-24 DIAGNOSIS — H401133 Primary open-angle glaucoma, bilateral, severe stage: Secondary | ICD-10-CM | POA: Diagnosis not present

## 2024-03-28 ENCOUNTER — Other Ambulatory Visit: Payer: Self-pay | Admitting: Family Medicine

## 2024-04-03 ENCOUNTER — Inpatient Hospital Stay: Attending: Hematology & Oncology

## 2024-04-03 ENCOUNTER — Inpatient Hospital Stay

## 2024-04-03 ENCOUNTER — Encounter: Payer: Self-pay | Admitting: Hematology & Oncology

## 2024-04-03 ENCOUNTER — Inpatient Hospital Stay: Admitting: Hematology & Oncology

## 2024-04-03 ENCOUNTER — Other Ambulatory Visit: Payer: Self-pay

## 2024-04-03 VITALS — BP 159/62 | HR 83 | Temp 98.1°F | Resp 16 | Ht 59.5 in | Wt 148.0 lb

## 2024-04-03 DIAGNOSIS — R531 Weakness: Secondary | ICD-10-CM | POA: Diagnosis not present

## 2024-04-03 DIAGNOSIS — Z853 Personal history of malignant neoplasm of breast: Secondary | ICD-10-CM | POA: Insufficient documentation

## 2024-04-03 DIAGNOSIS — Z7982 Long term (current) use of aspirin: Secondary | ICD-10-CM | POA: Insufficient documentation

## 2024-04-03 DIAGNOSIS — E039 Hypothyroidism, unspecified: Secondary | ICD-10-CM

## 2024-04-03 DIAGNOSIS — D751 Secondary polycythemia: Secondary | ICD-10-CM | POA: Diagnosis not present

## 2024-04-03 DIAGNOSIS — Z79899 Other long term (current) drug therapy: Secondary | ICD-10-CM | POA: Insufficient documentation

## 2024-04-03 DIAGNOSIS — R200 Anesthesia of skin: Secondary | ICD-10-CM

## 2024-04-03 DIAGNOSIS — R7989 Other specified abnormal findings of blood chemistry: Secondary | ICD-10-CM | POA: Diagnosis not present

## 2024-04-03 LAB — CBC WITH DIFFERENTIAL (CANCER CENTER ONLY)
Abs Immature Granulocytes: 0.07 10*3/uL (ref 0.00–0.07)
Basophils Absolute: 0.1 10*3/uL (ref 0.0–0.1)
Basophils Relative: 1 %
Eosinophils Absolute: 0.5 10*3/uL (ref 0.0–0.5)
Eosinophils Relative: 6 %
HCT: 42.1 % (ref 36.0–46.0)
Hemoglobin: 13.6 g/dL (ref 12.0–15.0)
Immature Granulocytes: 1 %
Lymphocytes Relative: 31 %
Lymphs Abs: 2.6 10*3/uL (ref 0.7–4.0)
MCH: 29.6 pg (ref 26.0–34.0)
MCHC: 32.3 g/dL (ref 30.0–36.0)
MCV: 91.5 fL (ref 80.0–100.0)
Monocytes Absolute: 1.1 10*3/uL — ABNORMAL HIGH (ref 0.1–1.0)
Monocytes Relative: 13 %
Neutro Abs: 4 10*3/uL (ref 1.7–7.7)
Neutrophils Relative %: 48 %
Platelet Count: 272 10*3/uL (ref 150–400)
RBC: 4.6 MIL/uL (ref 3.87–5.11)
RDW: 13.6 % (ref 11.5–15.5)
WBC Count: 8.4 10*3/uL (ref 4.0–10.5)
nRBC: 0 % (ref 0.0–0.2)

## 2024-04-03 LAB — CMP (CANCER CENTER ONLY)
ALT: 10 U/L (ref 0–44)
AST: 17 U/L (ref 15–41)
Albumin: 4.6 g/dL (ref 3.5–5.0)
Alkaline Phosphatase: 47 U/L (ref 38–126)
Anion gap: 8 (ref 5–15)
BUN: 14 mg/dL (ref 8–23)
CO2: 27 mmol/L (ref 22–32)
Calcium: 9.5 mg/dL (ref 8.9–10.3)
Chloride: 106 mmol/L (ref 98–111)
Creatinine: 0.82 mg/dL (ref 0.44–1.00)
GFR, Estimated: >60 mL/min (ref >60)
Glucose, Bld: 112 mg/dL — ABNORMAL HIGH (ref 70–99)
Potassium: 3.9 mmol/L (ref 3.5–5.1)
Sodium: 141 mmol/L (ref 135–145)
Total Bilirubin: 0.8 mg/dL (ref 0.0–1.2)
Total Protein: 7.4 g/dL (ref 6.5–8.1)

## 2024-04-03 LAB — RETICULOCYTES
Immature Retic Fract: 9.4 % (ref 2.3–15.9)
RBC.: 4.59 MIL/uL (ref 3.87–5.11)
Retic Count, Absolute: 87.2 10*3/uL (ref 19.0–186.0)
Retic Ct Pct: 1.9 % (ref 0.4–3.1)

## 2024-04-03 LAB — TSH: TSH: 1.78 u[IU]/mL (ref 0.350–4.500)

## 2024-04-03 LAB — IRON AND IRON BINDING CAPACITY (CC-WL,HP ONLY)
Iron: 75 ug/dL (ref 28–170)
Saturation Ratios: 17 % (ref 10.4–31.8)
TIBC: 452 ug/dL — ABNORMAL HIGH (ref 250–450)
UIBC: 377 ug/dL (ref 148–442)

## 2024-04-03 LAB — FERRITIN: Ferritin: 44 ng/mL (ref 11–307)

## 2024-04-03 LAB — VITAMIN B12: Vitamin B-12: 2121 pg/mL — ABNORMAL HIGH (ref 180–914)

## 2024-04-03 NOTE — Progress Notes (Signed)
 Hematology and Oncology Follow Up Visit  Joanna White [id number] [birth year] 83 y.o. 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 daily Synthroid  0.75 mg p.o. daily   Interim History:  Joanna White is here today for follow-up.  She has not had the MRI of the brain yet.  Apparently this is can be done later on in the month.  She sees a Insurance account manager in June..  She feels better.  She did donate blood.  I am glad that she is able to donate blood.  I think this will be a very good idea for her..  She is on baby aspirin  and doing well with this.  Neurologically she seems to be doing little bit better.  I do not think she has the numbness in the left hand right now..  She is cooking.  She is mowing her yard.  She has had no fever.  She has had no change in bowel or bladder habits.  She has had no rashes.  Overall, I would say that her performance status is probably ECOG 1.     Medications:  Allergies as of 04/03/2024       Reactions   Flonase  [fluticasone Propionate] Anaphylaxis   Fluticasone Propionate Other (See Comments)   REACTION: throat tight        Medication List        Accurate as of Apr 03, 2024  9:10 AM. If you have any questions, ask your nurse or doctor.          aspirin  EC 81 MG tablet Take 1 tablet (81 mg total) by mouth daily. Swallow whole.   latanoprost 0.005 % ophthalmic solution Commonly known as: XALATAN Place into both eyes at bedtime.   levothyroxine  75 MCG tablet Commonly known as: SYNTHROID  Take 1 tablet (75 mcg total) by mouth daily before breakfast.   multivitamin tablet Take 1 tablet by mouth daily. With calcium    OIL OF OREGANO PO Take by mouth.   rosuvastatin  10 MG tablet Commonly known as: CRESTOR  TAKE 1 TABLET (10 MG TOTAL) BY MOUTH EVERY MONDAY,  WEDNESDAY, AND FRIDAY.   Vitamin D3 25 MCG (1000 UT) Caps Take 1 capsule (1,000 Units total) by mouth daily.        Allergies:  Allergies  Allergen Reactions   Flonase  [Fluticasone Propionate] Anaphylaxis   Fluticasone Propionate Other (See Comments)    REACTION: throat tight    Past Medical History, Surgical history, Social history, and Family History were reviewed and updated.  Review of Systems: Review of Systems  Constitutional: Negative.   HENT: Negative.    Eyes: Negative.   Respiratory: Negative.    Cardiovascular: Negative.   Gastrointestinal: Negative.   Genitourinary: Negative.   Musculoskeletal: Negative.   Skin: Negative.   Neurological:  Positive for tingling, sensory change and focal weakness.  Endo/Heme/Allergies: Negative.   Psychiatric/Behavioral: Negative.       Physical Exam:  height is 4' 11.5" (1.511 m) and weight is 148 lb (67.1 kg). Her oral temperature is 98.1 F (36.7 C). Her blood pressure is 159/62 (abnormal) and her pulse is 83. Her respiration is 16 and oxygen saturation is 95%.   Wt Readings from Last 3 Encounters:  04/03/24 148 lb (67.1 kg)  03/09/24 (P) 146 lb (66.2 kg)  02/25/24 146 lb 6 oz (66.4 kg)    Physical Exam Vitals reviewed.  HENT:     Head: Normocephalic and atraumatic.  Eyes:     Pupils: Pupils are equal, round, and reactive to light.  Cardiovascular:     Rate and Rhythm: Normal rate and regular rhythm.     Heart sounds: Normal heart sounds.  Pulmonary:     Effort: Pulmonary effort is normal.     Breath sounds: Normal breath sounds.  Musculoskeletal:        General: No tenderness or deformity. Normal range of motion.     Cervical back: Normal range of motion.  Lymphadenopathy:     Cervical: No cervical adenopathy.  Skin:    General: Skin is warm and dry.     Findings: No erythema or rash.  Neurological:     Mental Status: She is alert and oriented to person, place, and time.  Psychiatric:         Behavior: Behavior normal.        Thought Content: Thought content normal.        Judgment: Judgment normal.      Lab Results  Component Value Date   WBC 8.4 04/03/2024   HGB 13.6 04/03/2024   HCT 42.1 04/03/2024   MCV 91.5 04/03/2024   PLT 272 04/03/2024   Lab Results  Component Value Date   FERRITIN 60 03/09/2024   IRON 96 03/09/2024   TIBC 417 03/09/2024   UIBC 321 03/09/2024   IRONPCTSAT 23 03/09/2024   Lab Results  Component Value Date   RETICCTPCT 1.9 04/03/2024   RBC 4.60 04/03/2024   RBC 4.59 04/03/2024   No results found for: "KPAFRELGTCHN", "LAMBDASER", "KAPLAMBRATIO" No results found for: "IGGSERUM", "IGA", "IGMSERUM" No results found for: "TOTALPROTELP", "ALBUMINELP", "A1GS", "A2GS", "BETS", "BETA2SER", "GAMS", "MSPIKE", "SPEI"   Chemistry      Component Value Date/Time   NA 141 04/03/2024 0823   NA 142 09/08/2017 1028   K 3.9 04/03/2024 0823   K 4.5 09/08/2017 1028   CL 106 04/03/2024 0823   CL 103 09/14/2014 1111   CO2 27 04/03/2024 0823   CO2 26 09/08/2017 1028   BUN 14 04/03/2024 0823   BUN 18.9 09/08/2017 1028   CREATININE 0.82 04/03/2024 0823   CREATININE 1.2 (H) 09/08/2017 1028      Component Value Date/Time   CALCIUM  9.5 04/03/2024 0823   CALCIUM  9.8 09/08/2017 1028   ALKPHOS 47 04/03/2024 0823   ALKPHOS 57 09/08/2017 1028   AST 17 04/03/2024 0823   AST 21 09/08/2017 1028   ALT 10 04/03/2024 0823   ALT 15 09/08/2017 1028   BILITOT 0.8 04/03/2024 0823   BILITOT 0.57 09/08/2017 1028       Impression and Plan: Joanna White is a very pleasant 83 yo white female with history of stage IIA ductal carcinoma of the left breast treated with adjuvant chemotherapy with CMF. Her tumor was ER negative. She is now out over  25 years.  Again, I am not sure exactly what happened going on with her.  She is doing better.  I think donating blood is certainly helping her.  She may have a little bit of erythrocytosis at could be causing some  cerebrovascular issues.  I told her that she could donate blood every 3 months.  I think this would be a good idea for her.  For right now, we will plan to get her back in 3 months.  She will would  have seen the neurologist by then.   [name], MD 5/5/20259:10 AM

## 2024-04-04 ENCOUNTER — Telehealth: Payer: Self-pay | Admitting: *Deleted

## 2024-04-04 NOTE — Telephone Encounter (Signed)
-----   Message from [name] sent at 04/03/2024  6:04 PM EDT ----- Please call and let her know that the iron level is okay.  Her thyroid  level is okay.  Have her  keep continuing to donate blood every 3 months.Aaron Aas

## 2024-04-17 ENCOUNTER — Other Ambulatory Visit

## 2024-05-04 NOTE — Progress Notes (Addendum)
 Initial neurology clinic note  Reason for Evaluation: Consultation requested by [name], MD for an opinion regarding left arm and leg paresthesia. My final recommendations will be communicated back to the requesting physician by way of shared medical record or letter to requesting physician via [place]  mail.  HPI: This is [name], a 83 y.o. right-handed female with a medical history of left breast s/p surgery, chemo, radiation, HLD, hypothyroidism, vit D deficiency who presents to neurology clinic with the chief complaint of left arm and leg paresthesia. The patient is alone today.  Patient has tingling in her left arm and leg for about 2 years. It happened suddenly on a Sunday on 07/26/2022. She sat at a barstool  when she went to stand and felt like her entire right side was asleep. She wondered about a stroke, so she looked in the mirror and called her daughter. She went urgent care who also wondered about a stroke, but did not do any tests. She was given prednisone which did nothing and sent patient to ortho. Ortho ordered carotid US  which did not show anything per patient (do not have records). PCP then ordered a CT head. This showed chronic microvascular ischemia. Her oncologist ordered MRI brain 11/17/22 that showed a chronic lacune in right corona radiata.  Since onset, symptoms are not as diffuse. It is now digits 4,5 into medial forearm and lateral aspect of lower leg and toes except great toe. She has to be more careful when walking, but denies weakness, imbalance, or falls.  Patient was started on Crestor  10 mg for about 1 year ago. She is doing much better on cholesterol so is now only taking it MWF. She was started on aspirin  at some point in 2023 per patient by oncology. She takes 81 mg daily.  Patient is a never smoker. Never EtOH. Never drug use.  Family history of neurologic disease: no  Referred by oncology who sees patient for history of stage IIA ductal  carcinoma of left breast that was treated with CMF chemotherapy (cyclophosphamide, methotrexate, and fluorouracil), which is rarely neurotoxic per my review.   MEDICATIONS:  Outpatient Encounter Medications as of 05/12/2024  Medication Sig   aspirin  EC 81 MG tablet Take 1 tablet (81 mg total) by mouth daily. Swallow whole.   Cholecalciferol (VITAMIN D3) 1000 units CAPS Take 1 capsule (1,000 Units total) by mouth daily.   latanoprost (XALATAN) 0.005 % ophthalmic solution Place into both eyes at bedtime.    levothyroxine  (SYNTHROID ) 75 MCG tablet Take 1 tablet (75 mcg total) by mouth daily before breakfast.   Multiple Vitamin (MULTIVITAMIN) tablet Take 1 tablet by mouth daily. With calcium  (Patient taking differently: Take 1 tablet by mouth daily. With calcium   When she remembers)   OIL OF OREGANO PO Take by mouth.   rosuvastatin  (CRESTOR ) 10 MG tablet TAKE 1 TABLET (10 MG TOTAL) BY MOUTH EVERY MONDAY, WEDNESDAY, AND FRIDAY.   No facility-administered encounter medications on file as of 05/12/2024.    PAST MEDICAL HISTORY: Past Medical History:  Diagnosis Date   Glaucoma    Hypothyroid    Osteopenia 08/2011   h/o osteoporosis, s/p boniva for 5 yrs   Personal history of malignant neoplasm of breast 1998   Left    PAST SURGICAL HISTORY: Past Surgical History:  Procedure Laterality Date   BREAST LUMPECTOMY  1998   Left   CESAREAN SECTION     x 2   DEXA  10/2015   osteopenia,  T -1.1 spine, hip -1.7    GYN surgery     uterine lining removed for heavy bleeding and polyp   lymph node removal  1998   left axilla   Thumb surgery     bilateral   TOTAL KNEE ARTHROPLASTY Left 2016   Aisha Ali)    ALLERGIES: Allergies  Allergen Reactions   Flonase  [Fluticasone Propionate] Anaphylaxis   Fluticasone Propionate Other (See Comments)    REACTION: throat tight    FAMILY HISTORY: Family History  Problem Relation Age of Onset   Diabetes Brother        x2   Cancer Father 27        lung   Cancer Mother 23       MM   Coronary artery disease Brother 82       CABG   Diabetes Brother    Diabetes Maternal Grandmother    Vasculitis Nephew        wegener's vasculitis   Stroke Neg Hx     SOCIAL HISTORY: Social History   Tobacco Use   Smoking status: Never   Smokeless tobacco: Never  Vaping Use   Vaping status: Never Used  Substance Use Topics   Alcohol use: Never   Drug use: No   Social History   Social History Narrative   Caffeine: 2cups coffee/dayWidow-husband died of MI on operating table2 children, grown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 Right   Are you currently employed ?    What is your current occupation? retired   Do you live at home alone?yes   Who lives with you?    What type of home do you live in: 1 story or 2 story?  Split level    Caffiene 2 coffee a day     OBJECTIVE: PHYSICAL EXAM: BP (!) 149/73   Pulse 91   Ht 5' (1.524 m)   Wt 146 lb (66.2 kg)   SpO2 93%   BMI 28.51 kg/m   General: No acute distress.  Patient appears well-groomed.   Head:  Normocephalic/atraumatic Neck: supple Heart: regular rate and rhythm Lungs: Clear to auscultation bilaterally. Vascular: No carotid bruits.  Neurological Exam: Mental status: alert and oriented, speech fluent and not dysarthric, language intact.  Cranial nerves: CN I: not tested CN II: pupils equal, round and reactive to light, visual fields intact CN III, IV, VI:  full range of motion, no nystagmus, no ptosis CN V: facial sensation intact. CN VII: upper and lower face symmetric CN VIII: hearing intact CN IX, X: uvula midline CN XI: sternocleidomastoid and trapezius muscles intact CN XII: tongue midline  Bulk & Tone: normal, no fasciculations. Motor:  muscle strength 5/5 throughout Deep Tendon Reflexes:  2+ throughout, except absent at bilateral ankles.   Sensation:  Pinprick  sensation intact. Vibration sensation diminished in left 5th digit compared to left 1st digit, otherwise intact Finger to nose testing:  Without dysmetria.   Gait:  Normal station and stride.  Romberg negative.   Lab and Test Review: Internal labs: 04/03/24: TSH wnl B12: 2121 Ferritin 44 CMP significant for glucose 112 CBC w/ diff unremarkable  Lipid panel (01/12/21): tChol 238, LDL 158, TG 153 Lipid panel (02/25/24): tChol 141, LDL 70, TG 115  Imaging/Procedures: MRI brain w/wo contrast (11/17/22): FINDINGS: Brain: No acute infarction, hemorrhage, hydrocephalus, extra-axial collection or mass lesion. Patchy T2 hyperintensity in the cerebral white  matter and brainstem best attributed to chronic small vessel ischemia. Chronic lacune in the right corona radiata. No chronic gray matter infarcts. Age normal brain volume. No abnormal enhancement or swelling to suggest metastatic disease.   Vascular: Normal flow voids.   Skull and upper cervical spine: Normal marrow signal.   Sinuses/Orbits: Negative   IMPRESSION: 1. Negative for metastatic disease or recent insult. 2. Moderate chronic small vessel ischemia.   ASSESSMENT: Joanna White is a 83 y.o. female who presents for evaluation of sudden onset left arm and leg paresthesia on 07/26/2022. She has a relevant medical history of left breast s/p surgery, chemo, radiation, HLD, hypothyroidism, vit D deficiency. Her neurological examination is pertinent for mildly diminished sensation in lateral aspect of left foot. 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 Patient's symptoms are consistent with stroke. The chronic right corona radiata stroke seen in 10/2022 was likely acute in 06/2022. Fortunately, symptoms were relatively mild and have improved to only minor paresthesia. She is already on appropriate secondary stroke  prevention. Her stroke etiology is small vessel disease with untreated HLD at time of stroke that has now greatly improved. I did discuss that given that her stroke was 2 years ago, her current deficits are likely permanent.   PLAN: -Continue asa 81 mg daily -Continue Crestor  10 mg MWF; goal is LDL < 70 -Discussed stroke warning signs and to call EMS and symptom onset  -Return to clinic as needed  The impression above as well as the plan as outlined below were extensively discussed with the patient who voiced understanding. All questions were answered to their satisfaction.  The patient was counseled on pertinent fall precautions per the printed material provided today, and as noted under the Patient Instructions section below.  When available, results of the above investigations and possible further recommendations will be communicated to the patient via telephone/MyChart. Patient to call office if not contacted after expected testing turnaround time.   Total time spent reviewing records, interview, history/exam, documentation, and coordination of care on day of encounter:  60 min   Thank you for allowing me to participate in patient's care.  If I can answer any additional questions, I would be pleased to do so.  Rommie Coats, MD   CC: Claire Crick, MD [address] [place] [address]  CC: Referring provider: [name], MD [address] [place],  [place] [address]

## 2024-05-12 ENCOUNTER — Encounter: Payer: Self-pay | Admitting: Neurology

## 2024-05-12 ENCOUNTER — Ambulatory Visit: Admitting: Neurology

## 2024-05-12 VITALS — BP 138/60 | HR 91 | Ht 60.0 in | Wt 146.0 lb

## 2024-05-12 DIAGNOSIS — I639 Cerebral infarction, unspecified: Secondary | ICD-10-CM | POA: Diagnosis not present

## 2024-05-12 NOTE — Patient Instructions (Addendum)
 I saw you today for the abnormal sensation in your left arm and leg that suddenly started on 07/26/22. I think you had a small stroke. This is backed up by your MRI brain from 10/2022 showing a small old stroke on the right side of your brain that controls the left side of your body.  I want you to continue aspirin  81 mg daily.  I want you to continue Crestor  for cholesterol.  If you have new difficulty speaking, face droop, numbness on one side of the body, weakness on one side of the body, or dizziness/imbalance, this could be the sign of a stroke. Don't wait, please call EMS and be evaluated at the nearest emergency room.   Return to see me as needed.  The physicians and staff at [facility] are committed to providing excellent care. You may receive a survey requesting feedback about your experience at our office. We strive to receive very good responses to the survey questions. If you feel that your experience would prevent you from giving the office a very good  response, please contact our office to try to remedy the situation. We may be reached at [phone]. Thank you for taking the time out of your busy day to complete the survey.  Rommie Coats, MD [facility]  Preventing Falls at [facility] are common, often dreaded events in the lives of older people. Aside from the obvious injuries and even death that may result, fall can cause wide-ranging consequences including loss of independence, mental decline, decreased activity and mobility. Younger people are also at risk of falling, especially those with chronic illnesses and fatigue.  Ways to reduce risk for falling Examine diet and medications. Warm foods and alcohol dilate blood vessels, which can lead to dizziness when standing. Sleep aids, antidepressants and pain medications can also increase the likelihood of a fall.  Get a vision exam. Poor vision, cataracts and glaucoma increase the chances of falling.  Check  foot gear. Shoes should fit snugly and have a sturdy, nonskid sole and a broad, low heel  Participate in a physician-approved exercise program to build and maintain muscle strength and improve balance and coordination. Programs that use ankle weights or stretch bands are excellent for muscle-strengthening. Water aerobics programs and low-impact Tai Chi programs have also been shown to improve balance and coordination.  Increase vitamin D  intake. Vitamin D  improves muscle strength and increases the amount of calcium  the body is able to absorb and deposit in bones.  How to prevent falls from common hazards Floors - Remove all loose wires, cords, and throw rugs. Minimize clutter. Make sure rugs are anchored and smooth. Keep furniture in its usual place.  Chairs -- Use chairs with straight backs, armrests and firm seats. Add firm cushions to existing pieces to add height.  Bathroom - Install grab bars and non-skid tape in the tub or shower. Use a bathtub transfer bench or a shower chair with a back support Use an elevated toilet seat and/or safety rails to assist standing from a low surface. Do not use towel racks or bathroom tissue holders to help you stand.  Lighting - Make sure halls, stairways, and entrances are well-lit. Install a night light in your bathroom or hallway. Make sure there is a light switch at the top and bottom of the staircase. Turn lights on if you get up in the middle of the night. Make sure lamps or light switches are within reach of the bed if you have  to get up during the night.  Kitchen - Install non-skid rubber mats near the sink and stove. Clean spills immediately. Store frequently used utensils, pots, pans between waist and eye level. This helps prevent reaching and bending. Sit when getting things out of lower cupboards.  Living room/ Bedrooms - Place furniture with wide spaces in between, giving enough room to move around. Establish a route through the living room that  gives you something to hold onto as you walk.  Stairs - Make sure treads, rails, and rugs are secure. Install a rail on both sides of the stairs. If stairs are a threat, it might be helpful to arrange most of your activities on the lower level to reduce the number of times you must climb the stairs.  Entrances and doorways - Install metal handles on the walls adjacent to the doorknobs of all doors to make it more secure as you travel through the doorway.  Tips for maintaining balance Keep at least one hand free at all times. Try using a backpack or fanny pack to hold things rather than carrying them in your hands. Never carry objects in both hands when walking as this interferes with keeping your balance.  Attempt to swing both arms from front to back while walking. This might require a conscious effort if Parkinson's disease has diminished your movement. It will, however, help you to maintain balance and posture, and reduce fatigue.  Consciously lift your feet off of the ground when walking. Shuffling and dragging of the feet is a common culprit in losing your balance.  When trying to navigate turns, use a U technique of facing forward and making a wide turn, rather than pivoting sharply.  Try to stand with your feet shoulder-length apart. When your feet are close together for any length of time, you increase your risk of losing your balance and falling.  Do one thing at a time. Don't try to walk and accomplish another task, such as reading or looking around. The decrease in your automatic reflexes complicates motor function, so the less distraction, the better.  Do not wear rubber or gripping soled shoes, they might catch on the floor and cause tripping.  Move slowly when changing positions. Use deliberate, concentrated movements and, if needed, use a grab bar or walking aid. Count 15 seconds between each movement. For example, when rising from a seated position, wait 15 seconds after  standing to begin walking.  If balance is a continuous problem, you might want to consider a walking aid such as a cane, walking stick, or walker. Once you've mastered walking with help, you might be ready to try it on your own again.

## 2024-06-16 DIAGNOSIS — H43811 Vitreous degeneration, right eye: Secondary | ICD-10-CM | POA: Diagnosis not present

## 2024-06-16 DIAGNOSIS — H401133 Primary open-angle glaucoma, bilateral, severe stage: Secondary | ICD-10-CM | POA: Diagnosis not present

## 2024-06-16 DIAGNOSIS — Z961 Presence of intraocular lens: Secondary | ICD-10-CM | POA: Diagnosis not present

## 2024-07-03 ENCOUNTER — Ambulatory Visit: Payer: Self-pay | Admitting: Hematology & Oncology

## 2024-07-03 ENCOUNTER — Inpatient Hospital Stay: Admitting: Hematology & Oncology

## 2024-07-03 ENCOUNTER — Inpatient Hospital Stay: Attending: Hematology & Oncology

## 2024-07-03 ENCOUNTER — Encounter: Payer: Self-pay | Admitting: Hematology & Oncology

## 2024-07-03 VITALS — BP 144/73 | HR 69 | Temp 98.2°F | Resp 20 | Ht 59.5 in | Wt 145.0 lb

## 2024-07-03 DIAGNOSIS — E611 Iron deficiency: Secondary | ICD-10-CM | POA: Diagnosis not present

## 2024-07-03 DIAGNOSIS — Z853 Personal history of malignant neoplasm of breast: Secondary | ICD-10-CM | POA: Insufficient documentation

## 2024-07-03 DIAGNOSIS — E039 Hypothyroidism, unspecified: Secondary | ICD-10-CM | POA: Diagnosis not present

## 2024-07-03 DIAGNOSIS — Z79899 Other long term (current) drug therapy: Secondary | ICD-10-CM | POA: Diagnosis not present

## 2024-07-03 DIAGNOSIS — G5712 Meralgia paresthetica, left lower limb: Secondary | ICD-10-CM | POA: Diagnosis not present

## 2024-07-03 DIAGNOSIS — D751 Secondary polycythemia: Secondary | ICD-10-CM | POA: Diagnosis not present

## 2024-07-03 LAB — CBC WITH DIFFERENTIAL (CANCER CENTER ONLY)
Abs Immature Granulocytes: 0.02 K/uL (ref 0.00–0.07)
Basophils Absolute: 0.1 K/uL (ref 0.0–0.1)
Basophils Relative: 1 %
Eosinophils Absolute: 0.7 K/uL — ABNORMAL HIGH (ref 0.0–0.5)
Eosinophils Relative: 8 %
HCT: 41.4 % (ref 36.0–46.0)
Hemoglobin: 13.4 g/dL (ref 12.0–15.0)
Immature Granulocytes: 0 %
Lymphocytes Relative: 29 %
Lymphs Abs: 2.4 K/uL (ref 0.7–4.0)
MCH: 28.7 pg (ref 26.0–34.0)
MCHC: 32.4 g/dL (ref 30.0–36.0)
MCV: 88.7 fL (ref 80.0–100.0)
Monocytes Absolute: 1 K/uL (ref 0.1–1.0)
Monocytes Relative: 13 %
Neutro Abs: 4 K/uL (ref 1.7–7.7)
Neutrophils Relative %: 49 %
Platelet Count: 262 K/uL (ref 150–400)
RBC: 4.67 MIL/uL (ref 3.87–5.11)
RDW: 13.8 % (ref 11.5–15.5)
WBC Count: 8.1 K/uL (ref 4.0–10.5)
nRBC: 0 % (ref 0.0–0.2)

## 2024-07-03 LAB — CMP (CANCER CENTER ONLY)
ALT: 13 U/L (ref 0–44)
AST: 24 U/L (ref 15–41)
Albumin: 4.4 g/dL (ref 3.5–5.0)
Alkaline Phosphatase: 55 U/L (ref 38–126)
Anion gap: 11 (ref 5–15)
BUN: 14 mg/dL (ref 8–23)
CO2: 25 mmol/L (ref 22–32)
Calcium: 9.7 mg/dL (ref 8.9–10.3)
Chloride: 106 mmol/L (ref 98–111)
Creatinine: 0.82 mg/dL (ref 0.44–1.00)
GFR, Estimated: >60 mL/min (ref >60)
Glucose, Bld: 102 mg/dL — ABNORMAL HIGH (ref 70–99)
Potassium: 4.3 mmol/L (ref 3.5–5.1)
Sodium: 142 mmol/L (ref 135–145)
Total Bilirubin: 0.6 mg/dL (ref 0.0–1.2)
Total Protein: 7.5 g/dL (ref 6.5–8.1)

## 2024-07-03 LAB — FERRITIN: Ferritin: 26 ng/mL (ref 11–307)

## 2024-07-03 LAB — TSH: TSH: 0.851 u[IU]/mL (ref 0.350–4.500)

## 2024-07-03 LAB — IRON AND IRON BINDING CAPACITY (CC-WL,HP ONLY)
Iron: 47 ug/dL (ref 28–170)
Saturation Ratios: 9 % — ABNORMAL LOW (ref 10.4–31.8)
TIBC: 501 ug/dL — ABNORMAL HIGH (ref 250–450)
UIBC: 454 ug/dL

## 2024-07-03 NOTE — Progress Notes (Signed)
 BP remains elevated, 144/73, instructed to monitor at home and if it remains over 140/90, notify PCP. Verbalized understanding.

## 2024-07-03 NOTE — Progress Notes (Signed)
 Hematology and Oncology Follow Up Visit  Joanna White [id number] Jun 29, 1941 83 y.o. 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 daily Synthroid  0.75 mg p.o. daily   Interim History:  Joanna White is here today for follow-up.  She is doing quite well.  She is donating blood every 3 months.  Her hemoglobin is coming down quite nicely..  She did see neurology.  He said that she did have some TIAs.  This is probably why she has numbness in the left hand and left foot.  She is on baby aspirin .  She is still quite active.  She mows her yard.  She does weed eating.  She is 83 years old.  Nothing will stop her from working.  Her appetite is quite good.  She has had no nausea or vomiting.  She has had no cough or shortness of breath.  There has been no bleeding.  She has had no rashes.  There have been no leg swelling.  She has had no change in bowel or bladder habits.  Overall, I would have said that her performance status is probably ECOG 1.    Medications:  Allergies as of 07/03/2024       Reactions   Flonase  [fluticasone Propionate] Anaphylaxis   Fluticasone Propionate Other (See Comments)   REACTION: throat tight        Medication List        Accurate as of July 03, 2024 11:20 AM. If you have any questions, ask your nurse or doctor.          aspirin  EC 81 MG tablet Take 1 tablet (81 mg total) by mouth daily. Swallow whole.   dorzolamide-timolol 2-0.5 % ophthalmic solution Commonly known as: COSOPT Place 1 drop into both eyes 2 (two) times daily.   latanoprost 0.005 % ophthalmic solution Commonly known as: XALATAN Place into both eyes at bedtime.   levothyroxine  75 MCG tablet Commonly known as: SYNTHROID  Take 1 tablet (75 mcg total) by mouth daily before breakfast.    multivitamin tablet Take 1 tablet by mouth daily. With calcium    OIL OF OREGANO PO Take by mouth.   rosuvastatin  10 MG tablet Commonly known as: CRESTOR  TAKE 1 TABLET (10 MG TOTAL) BY MOUTH EVERY MONDAY, WEDNESDAY, AND FRIDAY.   Vitamin D3 25 MCG (1000 UT) Caps Take 1 capsule (1,000 Units total) by mouth daily.        Allergies:  Allergies  Allergen Reactions   Flonase  [Fluticasone Propionate] Anaphylaxis   Fluticasone Propionate Other (See Comments)    REACTION: throat tight    Past Medical History, Surgical history, Social history, and Family History were reviewed and updated.  Review of Systems: Review of Systems  Constitutional: Negative.   HENT: Negative.    Eyes: Negative.   Respiratory: Negative.    Cardiovascular: Negative.   Gastrointestinal: Negative.   Genitourinary: Negative.   Musculoskeletal: Negative.   Skin: Negative.   Neurological:  Positive for tingling, sensory change and focal weakness.  Endo/Heme/Allergies: Negative.   Psychiatric/Behavioral: Negative.       Physical Exam:  height is 4' 11.5 (1.511 m) and weight is 145 lb 0.6 oz (65.8 kg). Her oral temperature is 98.2 F (36.8 C). Her blood pressure is 144/73 (abnormal) and her pulse is 69.  Her respiration is 20 and oxygen saturation is 94%.   Wt Readings from Last 3 Encounters:  07/03/24 145 lb 0.6 oz (65.8 kg)  05/12/24 146 lb (66.2 kg)  04/03/24 148 lb (67.1 kg)    Physical Exam Vitals reviewed.  HENT:     Head: Normocephalic and atraumatic.  Eyes:     Pupils: Pupils are equal, round, and reactive to light.  Cardiovascular:     Rate and Rhythm: Normal rate and regular rhythm.     Heart sounds: Normal heart sounds.  Pulmonary:     Effort: Pulmonary effort is normal.     Breath sounds: Normal breath sounds.  Musculoskeletal:        General: No tenderness or deformity. Normal range of motion.     Cervical back: Normal range of motion.  Lymphadenopathy:     Cervical: No  cervical adenopathy.  Skin:    General: Skin is warm and dry.     Findings: No erythema or rash.  Neurological:     Mental Status: She is alert and oriented to person, place, and time.  Psychiatric:        Behavior: Behavior normal.        Thought Content: Thought content normal.        Judgment: Judgment normal.      Lab Results  Component Value Date   WBC 8.1 07/03/2024   HGB 13.4 07/03/2024   HCT 41.4 07/03/2024   MCV 88.7 07/03/2024   PLT 262 07/03/2024   Lab Results  Component Value Date   FERRITIN 44 04/03/2024   IRON 75 04/03/2024   TIBC 452 (H) 04/03/2024   UIBC 377 04/03/2024   IRONPCTSAT 17 04/03/2024   Lab Results  Component Value Date   RETICCTPCT 1.9 04/03/2024   RBC 4.67 07/03/2024   No results found for: KPAFRELGTCHN, LAMBDASER, KAPLAMBRATIO No results found for: IGGSERUM, IGA, IGMSERUM No results found for: STEPHANY CARLOTA BENSON MARKEL EARLA JOANNIE DOC VICK, SPEI   Chemistry      Component Value Date/Time   NA 142 07/03/2024 1017   NA 142 09/08/2017 1028   K 4.3 07/03/2024 1017   K 4.5 09/08/2017 1028   CL 106 07/03/2024 1017   CL 103 09/14/2014 1111   CO2 25 07/03/2024 1017   CO2 26 09/08/2017 1028   BUN 14 07/03/2024 1017   BUN 18.9 09/08/2017 1028   CREATININE 0.82 07/03/2024 1017   CREATININE 1.2 (H) 09/08/2017 1028      Component Value Date/Time   CALCIUM  9.7 07/03/2024 1017   CALCIUM  9.8 09/08/2017 1028   ALKPHOS 55 07/03/2024 1017   ALKPHOS 57 09/08/2017 1028   AST 24 07/03/2024 1017   AST 21 09/08/2017 1028   ALT 13 07/03/2024 1017   ALT 15 09/08/2017 1028   BILITOT 0.6 07/03/2024 1017   BILITOT 0.57 09/08/2017 1028       Impression and Plan: Joanna White is a very pleasant 83 yo white female with history of stage IIA ductal carcinoma of the left breast treated with adjuvant chemotherapy with CM[facility]. Her tumor was ER negative. She is now out over  25 years.  I am glad that her  blood is coming down.  Clearly, and donating blood is helping her and also help in the community.  I told her that she could certainly cut back a little bit and donate blood every 4 months.  Her hemoglobin is come down nicely.  I think we can get her back  now after the Holidays.  I would like to see her back probably in January of next year.   Maude JONELLE Crease, MD 8/4/202511:20 AM

## 2024-07-04 NOTE — Telephone Encounter (Signed)
-----   Message from Joanna White sent at 07/03/2024  4:45 PM EDT ----- Please call and let her know that the iron is on the low side.  This is secondary to her giving blood donations.  Again I would not  donate blood for 4 months.  Joanna White ----- Message ----- From: Rebecka, [facility] [place] Sent: 07/03/2024  10:43 AM EDT To: Joanna JONELLE Crease, MD

## 2024-07-04 NOTE — Telephone Encounter (Signed)
 As noted below by Dr. Timmy, I informed the patient that the iron is on the low side. This is secondary to you giving blood donations. Again I would not donate blood for 4 months. Joanna White  She verbalized understanding.

## 2024-07-28 DIAGNOSIS — H401133 Primary open-angle glaucoma, bilateral, severe stage: Secondary | ICD-10-CM | POA: Diagnosis not present

## 2024-08-18 ENCOUNTER — Emergency Department (HOSPITAL_COMMUNITY)

## 2024-08-18 ENCOUNTER — Observation Stay (HOSPITAL_COMMUNITY)
Admission: EM | Admit: 2024-08-18 | Discharge: 2024-08-20 | Disposition: A | Discharge status: Home / Self Care | Attending: Internal Medicine | Admitting: Internal Medicine

## 2024-08-18 DIAGNOSIS — T782XXA Anaphylactic shock, unspecified, initial encounter: Secondary | ICD-10-CM | POA: Diagnosis not present

## 2024-08-18 DIAGNOSIS — R918 Other nonspecific abnormal finding of lung field: Secondary | ICD-10-CM | POA: Diagnosis not present

## 2024-08-18 DIAGNOSIS — K559 Vascular disorder of intestine, unspecified: Secondary | ICD-10-CM | POA: Diagnosis present

## 2024-08-18 DIAGNOSIS — Z8673 Personal history of transient ischemic attack (TIA), and cerebral infarction without residual deficits: Secondary | ICD-10-CM | POA: Diagnosis not present

## 2024-08-18 DIAGNOSIS — K047 Periapical abscess without sinus: Secondary | ICD-10-CM | POA: Diagnosis not present

## 2024-08-18 DIAGNOSIS — K55019 Acute (reversible) ischemia of small intestine, extent unspecified: Secondary | ICD-10-CM | POA: Diagnosis not present

## 2024-08-18 DIAGNOSIS — D72829 Elevated white blood cell count, unspecified: Secondary | ICD-10-CM | POA: Diagnosis not present

## 2024-08-18 DIAGNOSIS — Z7982 Long term (current) use of aspirin: Secondary | ICD-10-CM | POA: Insufficient documentation

## 2024-08-18 DIAGNOSIS — I700 Atherosclerosis of aorta: Secondary | ICD-10-CM | POA: Diagnosis not present

## 2024-08-18 DIAGNOSIS — I2510 Atherosclerotic heart disease of native coronary artery without angina pectoris: Secondary | ICD-10-CM | POA: Diagnosis not present

## 2024-08-18 DIAGNOSIS — K921 Melena: Secondary | ICD-10-CM | POA: Insufficient documentation

## 2024-08-18 DIAGNOSIS — E872 Acidosis, unspecified: Secondary | ICD-10-CM | POA: Diagnosis not present

## 2024-08-18 DIAGNOSIS — E039 Hypothyroidism, unspecified: Secondary | ICD-10-CM | POA: Insufficient documentation

## 2024-08-18 DIAGNOSIS — D751 Secondary polycythemia: Secondary | ICD-10-CM | POA: Insufficient documentation

## 2024-08-18 DIAGNOSIS — R55 Syncope and collapse: Secondary | ICD-10-CM | POA: Insufficient documentation

## 2024-08-18 DIAGNOSIS — R0602 Shortness of breath: Secondary | ICD-10-CM | POA: Diagnosis not present

## 2024-08-18 DIAGNOSIS — K922 Gastrointestinal hemorrhage, unspecified: Secondary | ICD-10-CM | POA: Diagnosis not present

## 2024-08-18 DIAGNOSIS — R457 State of emotional shock and stress, unspecified: Secondary | ICD-10-CM | POA: Diagnosis not present

## 2024-08-18 DIAGNOSIS — Z860100 Personal history of colon polyps, unspecified: Secondary | ICD-10-CM

## 2024-08-18 DIAGNOSIS — E870 Hyperosmolality and hypernatremia: Secondary | ICD-10-CM | POA: Diagnosis not present

## 2024-08-18 DIAGNOSIS — K5289 Other specified noninfective gastroenteritis and colitis: Secondary | ICD-10-CM | POA: Diagnosis not present

## 2024-08-18 DIAGNOSIS — E876 Hypokalemia: Secondary | ICD-10-CM | POA: Insufficient documentation

## 2024-08-18 DIAGNOSIS — K641 Second degree hemorrhoids: Secondary | ICD-10-CM | POA: Diagnosis not present

## 2024-08-18 DIAGNOSIS — R000 Tachycardia, unspecified: Secondary | ICD-10-CM | POA: Diagnosis not present

## 2024-08-18 DIAGNOSIS — Z860109 Personal history of other colon polyps: Secondary | ICD-10-CM | POA: Insufficient documentation

## 2024-08-18 DIAGNOSIS — D122 Benign neoplasm of ascending colon: Secondary | ICD-10-CM | POA: Insufficient documentation

## 2024-08-18 DIAGNOSIS — R110 Nausea: Secondary | ICD-10-CM | POA: Diagnosis not present

## 2024-08-18 DIAGNOSIS — R42 Dizziness and giddiness: Secondary | ICD-10-CM | POA: Diagnosis not present

## 2024-08-18 DIAGNOSIS — R7989 Other specified abnormal findings of blood chemistry: Secondary | ICD-10-CM | POA: Diagnosis not present

## 2024-08-18 DIAGNOSIS — D62 Acute posthemorrhagic anemia: Secondary | ICD-10-CM | POA: Insufficient documentation

## 2024-08-18 DIAGNOSIS — R197 Diarrhea, unspecified: Secondary | ICD-10-CM | POA: Diagnosis not present

## 2024-08-18 DIAGNOSIS — K6289 Other specified diseases of anus and rectum: Secondary | ICD-10-CM | POA: Diagnosis not present

## 2024-08-18 DIAGNOSIS — K529 Noninfective gastroenteritis and colitis, unspecified: Secondary | ICD-10-CM

## 2024-08-18 LAB — MAGNESIUM: Magnesium: 1.9 mg/dL (ref 1.7–2.4)

## 2024-08-18 LAB — BASIC METABOLIC PANEL WITH GFR
Anion gap: 14 (ref 5–15)
BUN: 14 mg/dL (ref 8–23)
CO2: 15 mmol/L — ABNORMAL LOW (ref 22–32)
Calcium: 6.7 mg/dL — ABNORMAL LOW (ref 8.9–10.3)
Chloride: 117 mmol/L — ABNORMAL HIGH (ref 98–111)
Creatinine, Ser: 0.67 mg/dL (ref 0.44–1.00)
GFR, Estimated: >60 mL/min (ref >60)
Glucose, Bld: 115 mg/dL — ABNORMAL HIGH (ref 70–99)
Potassium: 3.1 mmol/L — ABNORMAL LOW (ref 3.5–5.1)
Sodium: 146 mmol/L — ABNORMAL HIGH (ref 135–145)

## 2024-08-18 LAB — D-DIMER, QUANTITATIVE: D-Dimer, Quant: >20 ug{FEU}/mL — ABNORMAL HIGH (ref 0.00–0.50)

## 2024-08-18 LAB — HEPATIC FUNCTION PANEL
ALT: 13 U/L (ref 0–44)
AST: 28 U/L (ref 15–41)
Albumin: 3.8 g/dL (ref 3.5–5.0)
Alkaline Phosphatase: 52 U/L (ref 38–126)
Bilirubin, Direct: 0.2 mg/dL (ref 0.0–0.2)
Indirect Bilirubin: 0.5 mg/dL (ref 0.3–0.9)
Total Bilirubin: 0.7 mg/dL (ref 0.0–1.2)
Total Protein: 6.3 g/dL — ABNORMAL LOW (ref 6.5–8.1)

## 2024-08-18 LAB — HEMOGLOBIN AND HEMATOCRIT, BLOOD
HCT: 45.5 % (ref 36.0–46.0)
Hemoglobin: 14.6 g/dL (ref 12.0–15.0)

## 2024-08-18 LAB — CBC WITH DIFFERENTIAL/PLATELET
Abs Immature Granulocytes: 0.2 K/uL — ABNORMAL HIGH (ref 0.00–0.07)
Basophils Absolute: 0.1 K/uL (ref 0.0–0.1)
Basophils Relative: 0 %
Eosinophils Absolute: 0 K/uL (ref 0.0–0.5)
Eosinophils Relative: 0 %
HCT: 47.3 % — ABNORMAL HIGH (ref 36.0–46.0)
Hemoglobin: 14.4 g/dL (ref 12.0–15.0)
Immature Granulocytes: 1 %
Lymphocytes Relative: 6 %
Lymphs Abs: 1.3 K/uL (ref 0.7–4.0)
MCH: 27.7 pg (ref 26.0–34.0)
MCHC: 30.4 g/dL (ref 30.0–36.0)
MCV: 91 fL (ref 80.0–100.0)
Monocytes Absolute: 1 K/uL (ref 0.1–1.0)
Monocytes Relative: 5 %
Neutro Abs: 18.5 K/uL — ABNORMAL HIGH (ref 1.7–7.7)
Neutrophils Relative %: 88 %
Platelets: 217 K/uL (ref 150–400)
RBC: 5.2 MIL/uL — ABNORMAL HIGH (ref 3.87–5.11)
RDW: 14.9 % (ref 11.5–15.5)
WBC: 21.2 K/uL — ABNORMAL HIGH (ref 4.0–10.5)
nRBC: 0 % (ref 0.0–0.2)

## 2024-08-18 LAB — TROPONIN T, HIGH SENSITIVITY
Troponin T High Sensitivity: 33 ng/L — ABNORMAL HIGH (ref 0–19)
Troponin T High Sensitivity: 31 ng/L — ABNORMAL HIGH (ref 0–19)

## 2024-08-18 LAB — TYPE AND SCREEN
ABO/RH(D): A NEG
Antibody Screen: NEGATIVE

## 2024-08-18 LAB — PROTIME-INR
INR: 1.1 (ref 0.8–1.2)
Prothrombin Time: 14.3 s (ref 11.4–15.2)

## 2024-08-18 LAB — ABO/RH: ABO/RH(D): A NEG

## 2024-08-18 MED ORDER — MELATONIN 5 MG PO TABS: 5.0000 mg | ORAL_TABLET | Freq: Every evening | ORAL | Status: DC | PRN

## 2024-08-18 MED ORDER — IOHEXOL 350 MG/ML SOLN
75.0000 mL | Freq: Once | INTRAVENOUS | Status: AC | PRN
Start: 2024-08-18 — End: 2024-08-18
  Administered 2024-08-18: 75 mL via INTRAVENOUS

## 2024-08-18 MED ORDER — EPINEPHRINE 0.3 MG/0.3ML IJ SOAJ: 0.3000 mg | INTRAMUSCULAR | 0 refills | Status: DC | PRN

## 2024-08-18 MED ORDER — SODIUM CHLORIDE 0.9 % IV BOLUS
1000.0000 mL | Freq: Once | INTRAVENOUS | Status: AC
  Administered 2024-08-18: 1000 mL via INTRAVENOUS

## 2024-08-18 MED ORDER — ALUM & MAG HYDROXIDE-SIMETH 200-200-20 MG/5ML PO SUSP
30.0000 mL | Freq: Once | ORAL | Status: AC
  Administered 2024-08-18: 30 mL via ORAL
  Filled 2024-08-18: qty 30

## 2024-08-18 MED ORDER — METHYLPREDNISOLONE SODIUM SUCC 125 MG IJ SOLR
125.0000 mg | Freq: Once | INTRAMUSCULAR | Status: AC
  Administered 2024-08-18: 125 mg via INTRAVENOUS
  Filled 2024-08-18: qty 2

## 2024-08-18 MED ORDER — DIPHENHYDRAMINE HCL 50 MG/ML IJ SOLN
25.0000 mg | Freq: Once | INTRAMUSCULAR | Status: AC
  Administered 2024-08-18: 25 mg via INTRAVENOUS
  Filled 2024-08-18: qty 1

## 2024-08-18 MED ORDER — LOPERAMIDE HCL 2 MG PO CAPS
2.0000 mg | ORAL_CAPSULE | Freq: Once | ORAL | Status: AC
Start: 2024-08-18 — End: 2024-08-18
  Administered 2024-08-18: 2 mg via ORAL
  Filled 2024-08-18: qty 1

## 2024-08-18 MED ORDER — POLYETHYLENE GLYCOL 3350 17 G PO PACK: 17.0000 g | PACK | Freq: Every day | ORAL | Status: DC | PRN

## 2024-08-18 MED ORDER — ACETAMINOPHEN 325 MG PO TABS: 650.0000 mg | ORAL_TABLET | Freq: Four times a day (QID) | ORAL | Status: DC | PRN

## 2024-08-18 MED ORDER — IOHEXOL 350 MG/ML SOLN
100.0000 mL | Freq: Once | INTRAVENOUS | Status: AC | PRN
  Administered 2024-08-18: 100 mL via INTRAVENOUS

## 2024-08-18 MED ORDER — FAMOTIDINE IN NACL 20-0.9 MG/50ML-% IV SOLN
20.0000 mg | Freq: Once | INTRAVENOUS | Status: AC
  Administered 2024-08-18: 20 mg via INTRAVENOUS
  Filled 2024-08-18: qty 50

## 2024-08-18 MED ORDER — POTASSIUM CHLORIDE CRYS ER 20 MEQ PO TBCR
40.0000 meq | EXTENDED_RELEASE_TABLET | Freq: Once | ORAL | Status: AC
  Administered 2024-08-18: 40 meq via ORAL
  Filled 2024-08-18: qty 2

## 2024-08-18 MED ORDER — SODIUM CHLORIDE 0.9 % IV SOLN: INTRAVENOUS | Status: DC

## 2024-08-18 MED ORDER — PROCHLORPERAZINE EDISYLATE 10 MG/2ML IJ SOLN: 5.0000 mg | Freq: Four times a day (QID) | INTRAMUSCULAR | Status: DC | PRN

## 2024-08-18 NOTE — ED Triage Notes (Signed)
 Per EMS, lives at home. Pt was at a gravesite, moved a stone and was swarmed by ants on her right foot. C/o dizziness,  hypotension. Witnessed syncopal episode at a nearby [employer].  BP 132/70 CBG 86 HR 88

## 2024-08-18 NOTE — ED Provider Notes (Signed)
 [place] EMERGENCY DEPARTMENT AT [facility] Provider Note   CSN: [id number] Arrival date & time: 08/18/24  1212     Patient presents with: Loss of Consciousness and Hypotension   Joanna White is a 83 y.o. female.   Pt is a 83 yo female with pmhx significant for breast cancer, hypothyroidism, glaucoma, and osteopenia.  Pt was at a gravesite and moved a stone and she was swarmed by ants.  They bit her several times.  She felt dizzy afterwards, but went to [employer].  When she arrived, she had a syncopal event.  Per EMS, BP was in the 70s.  She was given epi twice by EMS and then put on an epi drip.  She feels shaky, but otherwise ok.         Prior to Admission medications   Medication Sig Start Date End Date Taking? Authorizing Provider  aspirin  EC 81 MG tablet Take 1 tablet (81 mg total) by mouth daily. Swallow whole. 09/15/22   Rilla Baller, MD  Cholecalciferol (VITAMIN D3) 1000 units CAPS Take 1 capsule (1,000 Units total) by mouth daily. 04/01/17   Rilla Baller, MD  dorzolamide-timolol (COSOPT) 2-0.5 % ophthalmic solution Place 1 drop into both eyes 2 (two) times daily. 06/16/24   [provider]  latanoprost (XALATAN) 0.005 % ophthalmic solution Place into both eyes at bedtime.  08/15/12   [provider]  levothyroxine  (SYNTHROID ) 75 MCG tablet Take 1 tablet (75 mcg total) by mouth daily before breakfast. 02/25/24   Rilla Baller, MD  Multiple Vitamin (MULTIVITAMIN) tablet Take 1 tablet by mouth daily. With calcium  01/14/21   Rilla Baller, MD  OIL OF OREGANO PO Take by mouth.    [provider]  rosuvastatin  (CRESTOR ) 10 MG tablet TAKE 1 TABLET (10 MG TOTAL) BY MOUTH EVERY MONDAY, WEDNESDAY, AND FRIDAY. 03/28/24   Rilla Baller, MD    Allergies: Flonase  [fluticasone propionate] and Fluticasone propionate    Review of Systems  Skin:  Positive for rash.  All other systems reviewed and are negative.   Updated  Vital Signs BP 122/67 (BP Location: Left Arm)   Pulse 85   Temp 98.1 F (36.7 C)   Resp 18   SpO2 99%   Physical Exam Vitals and nursing note reviewed.  Constitutional:      Appearance: Normal appearance.  HENT:     Head: Normocephalic and atraumatic.     Right Ear: External ear normal.     Left Ear: External ear normal.     Nose: Nose normal.     Mouth/Throat:     Mouth: Mucous membranes are dry.  Eyes:     Extraocular Movements: Extraocular movements intact.     Conjunctiva/sclera: Conjunctivae normal.     Pupils: Pupils are equal, round, and reactive to light.  Cardiovascular:     Rate and Rhythm: Normal rate and regular rhythm.     Pulses: Normal pulses.     Heart sounds: Normal heart sounds.  Pulmonary:     Effort: Pulmonary effort is normal.     Breath sounds: Normal breath sounds.  Abdominal:     General: Abdomen is flat. Bowel sounds are normal.     Palpations: Abdomen is soft.  Genitourinary:    Comments: Frank blood in rectal vault Musculoskeletal:        General: Normal range of motion.     Cervical back: Normal range of motion and neck supple.  Skin:    General: Skin is  warm.     Capillary Refill: Capillary refill takes less than 2 seconds.     Comments: Redness to right foot; multiple ant bites  Neurological:     General: No focal deficit present.     Mental Status: She is alert and oriented to person, place, and time.  Psychiatric:        Mood and Affect: Mood normal.        Behavior: Behavior normal.     (all labs ordered are listed, but only abnormal results are displayed) Labs Reviewed  BASIC METABOLIC PANEL WITH GFR - Abnormal; Notable for the following components:      Result Value   Sodium 146 (*)    Potassium 3.1 (*)    Chloride 117 (*)    CO2 15 (*)    Glucose, Bld 115 (*)    Calcium  6.7 (*)    All other components within normal limits  CBC WITH DIFFERENTIAL/PLATELET - Abnormal; Notable for the following components:   WBC 21.2 (*)     RBC 5.20 (*)    HCT 47.3 (*)    Neutro Abs 18.5 (*)    Abs Immature Granulocytes 0.20 (*)    All other components within normal limits  TROPONIN T, HIGH SENSITIVITY - Abnormal; Notable for the following components:   Troponin T High Sensitivity 33 (*)    All other components within normal limits  CBC WITH DIFFERENTIAL/PLATELET  D-DIMER, QUANTITATIVE  PROTIME-INR  HEPATIC FUNCTION PANEL  MAGNESIUM  TYPE AND SCREEN    EKG: EKG Interpretation Date/Time:  Friday August 18 2024 12:37:15 EDT Ventricular Rate:  80 PR Interval:  151 QRS Duration:  83 QT Interval:  356 QTC Calculation: 411 R Axis:   41  Text Interpretation: Sinus rhythm Borderline low voltage, extremity leads No old tracing to compare Confirmed by Dean Clarity ([id number]) on 08/18/2024 3:45:43 PM  Radiology: [facility] Chest Port 1 View Result Date: 08/18/2024 CLINICAL DATA:  Shortness of breath. EXAM: PORTABLE CHEST 1 VIEW COMPARISON:  06/20/2012 FINDINGS: Lungs are adequately inflated without focal airspace consolidation, effusion or pneumothorax. Cardiomediastinal silhouette and remainder of the exam is unchanged. IMPRESSION: No active disease. Electronically Signed   By: Toribio Agreste M.D.   On: 08/18/2024 14:18     Procedures   Medications Ordered in the ED  alum & mag hydroxide-simeth (MAALOX/MYLANTA) 200-200-20 MG/5ML suspension 30 mL (has no administration in time range)  potassium chloride  SA (KLOR-CON  M) CR tablet 40 mEq (has no administration in time range)  diphenhydrAMINE  (BENADRYL ) injection 25 mg (25 mg Intravenous Given 08/18/24 1406)  methylPREDNISolone  sodium succinate (SOLU-MEDROL ) 125 mg/2 mL injection 125 mg (125 mg Intravenous Given 08/18/24 1406)  famotidine  (PEPCID ) IVPB 20 mg premix (20 mg Intravenous New Bag/Given 08/18/24 1408)  loperamide  (IMODIUM ) capsule 2 mg (2 mg Oral Given 08/18/24 1342)  sodium chloride  0.9 % bolus 1,000 mL (1,000 mLs Intravenous New Bag/Given 08/18/24 1407)                                     Medical Decision Making Amount and/or Complexity of Data Reviewed Labs: ordered. Radiology: ordered.  Risk Prescription drug management.   This patient presents to the ED for concern of allergy, this involves an extensive number of treatment options, and is a complaint that carries with it a high risk of complications and morbidity.  The differential diagnosis includes allergy, anaphylactic rxn   Co  morbidities that complicate the patient evaluation  breast cancer, hypothyroidism, glaucoma, and osteopenia   Additional history obtained:  Additional history obtained from epic chart review External records from outside source obtained and reviewed including EMS report   Lab Tests:  I Ordered, and personally interpreted labs.  The pertinent results include:  cbc with wbc elevated at 21.2; bmp with na sl elevated at 146, k low at 31., CO low at 15   Imaging Studies ordered:  I ordered imaging studies including cxr  I independently visualized and interpreted imaging which showed No active disease.  I agree with the radiologist interpretation   Cardiac Monitoring:  The patient was maintained on a cardiac monitor.  I personally viewed and interpreted the cardiac monitored which showed an underlying rhythm of: nsr   Medicines ordered and prescription drug management:  I ordered medication including pepcid /benadryl /solumedrol  for allergy  Reevaluation of the patient after these medicines showed that the patient improved I have reviewed the patients home medicines and have made adjustments as needed   Test Considered:  ct   Critical Interventions:  meds   Problem List / ED Course:  Anaphylaxis:  sx much improved.  BP and HR stable. Rectal bleeding:  pt developed diarrhea and significant rectal bleeding while here.  She has gone to the bathroom several times and frank blood comes out.  CT angio ordered to check for diverticular  bleed. Hypokalemia:  kdur given   Reevaluation:  After the interventions noted above, I reevaluated the patient and found that they have :improved   Social Determinants of Health:  Lives at home   Dispostion:  After consideration of the diagnostic results and the patients response to treatment, I feel that the patent would benefit from admission.    CRITICAL CARE Performed by: Mliss Boyers   Total critical care time: 30 minutes  Critical care time was exclusive of separately billable procedures and treating other patients.  Critical care was necessary to treat or prevent imminent or life-threatening deterioration.  Critical care was time spent personally by me on the following activities: development of treatment plan with patient and/or surrogate as well as nursing, discussions with consultants, evaluation of patient's response to treatment, examination of patient, obtaining history from patient or surrogate, ordering and performing treatments and interventions, ordering and review of laboratory studies, ordering and review of radiographic studies, pulse oximetry and re-evaluation of patient's condition.      Final diagnoses:  Anaphylaxis, initial encounter  Lower GI bleed  Hypokalemia    ED Discharge Orders     None          Boyers Mliss, MD 08/18/24 1649

## 2024-08-18 NOTE — H&P (Addendum)
 History and Physical  Huntley Demedeiros FMW:[id number] DOB: 1941-11-19 DOA: 08/18/2024  Referring physician: Dr. Elnor, EDP  PCP: Rilla Baller, MD  Outpatient Specialists: Neurology, oncology. Patient coming from: Home  Chief Complaint: Lower GI bleed.  HPI: Joanna White is a 83 y.o. 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  The patient went at a graveside to clean her husband's stone and was swarmed by ants on her right foot.  She subsequently went to a nearby [employer] where she had an episode of loss of consciousness.  Felt dizzy lightheaded with a sudden urge of defecating.  States she was in her regular state of health prior to being bitten by ants today.  EMS was activated and she received 2 rounds of IM epi, reportedly the first epi pen's date was expired.  Also received epinephrine  drip en route via EMS due to hypotension.  Denies any facial involvement.  States her right foot was swollen from the ants bites.  In the ER, her right foot swelling had completely resolved.  Vital signs were normal.    While in the ER she had multiple watery stools.  The stools color went from brown to bloody.  She takes a baby aspirin  daily.  Admits to occasional use of NSAIDs, last use of home Advil was weeks ago.  Denies any abdominal pain or painful rectal bleed.  Denies history of hemorrhoids.  Last colonoscopy was at least 10 years ago.  She completed a week of antibiotic recently for tooth infection.  No history of C. difficile infection.  Denies diarrhea prior to today.  Denies any subjective fevers or chills.  ED Course: Temperature 100, BP 120 open 44, pulse 86, respiration rate 16, O2 saturation 94% on room air.  Lab studies notable for WBC 21.2, hemoglobin 14.4,  sodium 136, potassium 3.1, serum bicarb 15, calcium  6.7, troponin 31.  Review of Systems: Review of systems as noted in the HPI. All other systems reviewed and are negative.   Past Medical History:  Diagnosis Date   Glaucoma    Hypothyroid    Osteopenia 08/2011   h/o osteoporosis, s/p boniva for 5 yrs   Personal history of malignant neoplasm of breast 1998   Left   Past Surgical History:  Procedure Laterality Date   BREAST LUMPECTOMY  1998   Left   CESAREAN SECTION     x 2   DEXA  10/2015   osteopenia, T -1.1 spine, hip -1.7    GYN surgery     uterine lining removed for heavy bleeding and polyp   lymph node removal  1998   left axilla   Thumb surgery     bilateral   TOTAL KNEE ARTHROPLASTY Left 2016   [name])    Social History:  reports that she has never smoked. She has never used smokeless tobacco. She reports that she does not drink alcohol and does not use drugs.   Allergies  Allergen Reactions   Flonase  [Fluticasone Propionate] Anaphylaxis   Fluticasone Propionate Other (See Comments)    REACTION: throat tight    Family History  Problem Relation Age of Onset   Diabetes Brother        x2   Cancer Father 46       lung   Cancer Mother 27  MM   Coronary artery disease Brother 49       CABG   Diabetes Brother    Diabetes Maternal Grandmother    Vasculitis Nephew        wegener's vasculitis   Stroke Neg Hx       Prior to Admission medications   Medication Sig Start Date End Date Taking? Authorizing Provider  EPINEPHrine  0.3 mg/0.3 mL IJ SOAJ injection Inject 0.3 mg into the muscle as needed for anaphylaxis. 08/18/24  Yes Elnor Jayson LABOR, DO  aspirin  EC 81 MG tablet Take 1 tablet (81 mg total) by mouth daily. Swallow whole. 09/15/22   Rilla Baller, MD  Cholecalciferol (VITAMIN D3) 1000 units CAPS Take 1 capsule (1,000 Units total) by mouth daily. 04/01/17   Rilla Baller, MD  dorzolamide-timolol (COSOPT) 2-0.5 % ophthalmic solution Place 1  drop into both eyes 2 (two) times daily. 06/16/24   [provider]  latanoprost  (XALATAN ) 0.005 % ophthalmic solution Place into both eyes at bedtime.  08/15/12   [provider]  levothyroxine  (SYNTHROID ) 75 MCG tablet Take 1 tablet (75 mcg total) by mouth daily before breakfast. 02/25/24   Rilla Baller, MD  Multiple Vitamin (MULTIVITAMIN) tablet Take 1 tablet by mouth daily. With calcium  01/14/21   Rilla Baller, MD  OIL OF OREGANO PO Take by mouth.    [provider]  rosuvastatin  (CRESTOR ) 10 MG tablet TAKE 1 TABLET (10 MG TOTAL) BY MOUTH EVERY MONDAY, WEDNESDAY, AND FRIDAY. 03/28/24   Rilla Baller, MD    Physical Exam: BP (!) 147/59 (BP Location: Left Arm)   Pulse 86   Temp 99.2 F (37.3 C) (Oral)   Resp 18   SpO2 96%   General: 83 y.o. year-old female well developed well nourished in no acute distress.  Alert and oriented x3. Cardiovascular: Regular rate and rhythm with no rubs or gallops.  No thyromegaly or JVD noted.  No lower extremity edema. 2/4 pulses in all 4 extremities. Respiratory: Clear to auscultation with no wheezes or rales. Good inspiratory effort. Abdomen: Soft nontender nondistended with normal bowel sounds x4 quadrants. Muskuloskeletal: No cyanosis, clubbing or edema noted bilaterally Neuro: CN II-XII intact, strength, sensation, reflexes Skin: No ulcerative lesions noted or rashes Psychiatry: Judgement and insight appear normal. Mood is appropriate for condition and setting          Labs on Admission:  Basic Metabolic Panel: Recent Labs  Lab 08/18/24 1400 08/18/24 1605  NA 146*  --   K 3.1*  --   CL 117*  --   CO2 15*  --   GLUCOSE 115*  --   BUN 14  --   CREATININE 0.67  --   CALCIUM  6.7*  --   MG  --  1.9   Liver Function Tests: Recent Labs  Lab 08/18/24 1605  AST 28  ALT 13  ALKPHOS 52  BILITOT 0.7  PROT 6.3*  ALBUMIN 3.8   No results for input(s): LIPASE, AMYLASE in the last 168 hours. No  results for input(s): AMMONIA in the last 168 hours. CBC: Recent Labs  Lab 08/18/24 1505 08/18/24 1932  WBC 21.2*  --   NEUTROABS 18.5*  --   HGB 14.4 14.6  HCT 47.3* 45.5  MCV 91.0  --   PLT 217  --    Cardiac Enzymes: No results for input(s): CKTOTAL, CKMB, CKMBINDEX, TROPONINI in the last 168 hours.  BNP (last 3 results) No results for input(s): BNP in the last 8760 hours.  ProBNP (last 3 results) No results for input(s): PROBNP in the last 8760 hours.  CBG: No results for input(s): GLUCAP in the last 168 hours.  Radiological Exams on Admission: CT Angio Chest PE W and/or Wo Contrast Result Date: 08/18/2024 CLINICAL DATA:  Positive D-dimer. Dizziness and hypotension. Syncopal episode. Possible pulmonary embolism. EXAM: CT ANGIOGRAPHY CHEST WITH CONTRAST TECHNIQUE: Multidetector CT imaging of the chest was performed using the standard protocol during bolus administration of intravenous contrast. Multiplanar CT image reconstructions and MIPs were obtained to evaluate the vascular anatomy. RADIATION DOSE REDUCTION: This exam was performed according to the departmental dose-optimization program which includes automated exposure control, adjustment of the mA and/or kV according to patient size and/or use of iterative reconstruction technique. CONTRAST:  75mL OMNIPAQUE  IOHEXOL  350 MG/ML SOLN COMPARISON:  None Available. FINDINGS: Cardiovascular: Heart is normal size. Calcified plaque over the 3 vessel coronary arteries. Thoracic aorta is normal in caliber. Mild calcified plaque over the descending thoracic aorta. Pulmonary arterial system is well opacified without evidence of emboli. Mediastinum/Nodes: No mediastinal or hilar adenopathy. Remaining mediastinal structures are unremarkable. Lungs/Pleura: Lungs are adequately inflated without lobar consolidation or effusion. Subtle geographic patchy hazy attenuation of the lungs which can be seen in small airways disease. Mild  overall narrowing of the central airways. Upper Abdomen: Calcified plaque over the abdominal aorta. No acute findings. Musculoskeletal: No focal abnormality. Review of the MIP images confirms the above findings. IMPRESSION: 1. No evidence of pulmonary embolism. 2. Subtle geographic patchy hazy attenuation of the lungs which can be seen in small airways disease. Mild overall narrowing of the central airways. 3. Aortic atherosclerosis. Atherosclerotic coronary artery disease. Aortic Atherosclerosis (ICD10-I70.0). Electronically Signed   By: Toribio Agreste M.D.   On: 08/18/2024 20:01   CT ANGIO GI BLEED Result Date: 08/18/2024 CLINICAL DATA:  Bright red blood per rectum EXAM: CTA ABDOMEN AND PELVIS WITHOUT AND WITH CONTRAST TECHNIQUE: Multidetector CT imaging of the abdomen and pelvis was performed using the standard protocol during bolus administration of intravenous contrast. Multiplanar reconstructed images and MIPs were obtained and reviewed to evaluate the vascular anatomy. RADIATION DOSE REDUCTION: This exam was performed according to the departmental dose-optimization program which includes automated exposure control, adjustment of the mA and/or kV according to patient size and/or use of iterative reconstruction technique. CONTRAST:  OMNIPAQUE  IOHEXOL  350 MG/ML SOLN COMPARISON:  None available FINDINGS: VASCULAR Aorta: Normal caliber aorta without aneurysm, dissection, vasculitis or significant stenosis. Aortic atherosclerosis. Celiac: Patent SMA: Patent Renals: Patent IMA: Patent Inflow: Patent without evidence of aneurysm, dissection, vasculitis or significant stenosis. Atherosclerotic calcifications. Proximal Outflow: Bilateral common femoral and visualized portions of the superficial and profunda femoral arteries are patent without evidence of aneurysm, dissection, vasculitis or significant stenosis. Veins: No obvious venous abnormality within the limitations of this arterial phase study. Review of  the MIP images confirms the above findings. NON-VASCULAR Lower chest: No acute findings. Hepatobiliary: No focal hepatic abnormality. Gallbladder unremarkable. Pancreas: No focal abnormality or ductal dilatation. Spleen: No focal abnormality.  Normal size. Adrenals/Urinary Tract: No adrenal abnormality. No focal renal abnormality. No stones or hydronephrosis. Urinary bladder is unremarkable. Stomach/Bowel: No active contrast extravasation to localize GI bleed. Stomach, large and small bowel grossly unremarkable. No obstruction or inflammatory process. Lymphatic: No adenopathy Reproductive: Uterus and adnexa unremarkable.  No mass. Other: Trace free fluid in the cul-de-sac.  No free air. Musculoskeletal: No acute bony abnormality. IMPRESSION: VASCULAR Aortoiliac atherosclerosis.  No aneurysm or dissection. No contrast extravasation within the bowel  to localize GI bleed. NON-VASCULAR Trace free fluid in the pelvis. Otherwise no acute findings in the abdomen or pelvis. Electronically Signed   By: Franky Crease M.D.   On: 08/18/2024 17:25   DG Chest Port 1 View Result Date: 08/18/2024 CLINICAL DATA:  Shortness of breath. EXAM: PORTABLE CHEST 1 VIEW COMPARISON:  06/20/2012 FINDINGS: Lungs are adequately inflated without focal airspace consolidation, effusion or pneumothorax. Cardiomediastinal silhouette and remainder of the exam is unchanged. IMPRESSION: No active disease. Electronically Signed   By: Toribio Agreste M.D.   On: 08/18/2024 14:18    EKG: I independently viewed the EKG done and my findings are as followed: Sinus rhythm rate of 80.  Nonspecific ST-T changes.  QTc of 411  Assessment/Plan Present on Admission:  Lower GI bleed  Principal Problem:   Lower GI bleed  Lower GI bleed, acute Denies prior history of GI bleed Endorses occasional use of NSAIDs, Advil On daily baby aspirin  prior to admission Continue IV PPI twice daily for now, until seen by GI IV fluid hydration Repeat H&H every 6 hours  x 3. Maintain MAP greater than 65  Erythrocytosis JAK2 negative Hemoglobin is currently stable at 14.4. Continue to monitor H&H in the setting of GI bleed.  History of TIA on aspirin  Hold aspirin  for now due to GI bleed. Continue PPI until seen by GI.  Hypothyroidism Resume home levothyroxine .  Recently treated dental infection Completed amoxicillin  on 08/18/2024 from start date 08/10/2024.  Electrolytes disturbances Serum potassium 3.1, calcium  6.7. Potassium and calcium  repleted. Continue to monitor electrolytes and replete as indicated.  Mildly elevated troponin T Troponin 31, no reported anginal symptoms No evidence of acute ischemia on 12-lead EKG Monitor for now    Time: 75 minutes.   DVT prophylaxis: SCDs.  Code Status: Full code.  Family Communication: Son and daughter at bedside.  Disposition Plan: Admitted to progressive care unit.  Consults called: [facility] consulted by EDP.  Admission status: Observation status.   Status is: Observation    Terry LOISE Hurst MD [facility] Pager [phone]  If 7PM-7AM, please contact night-coverage [email or web address] Password [email or web address]  08/18/2024, 9:31 PM

## 2024-08-18 NOTE — ED Provider Notes (Signed)
  Provider Note MRN:  [id number]  Arrival date & time: 08/18/24    ED Course and Medical Decision Making  Assumed care from Dr Dean at shift change.  See note from prior team for complete details, in brief:  Clinical Course as of 08/18/24 2100  Fri Aug 18, 2024  1651 Handoff [facility] 83 yo/f Here w/ anaphylaxis, ant bites At [facility] where bites occurred, went to [employer] and had LOC. Hypotensive w/ EMS and given epi x2 and epi gtt Now has GIB, BRBPR Concern diverticular bleed CT pending [SG]  1739 CTA GIB was w/o extrav [SG]    Clinical Course User Index [SG] Elnor Savant A, DO     CTA GI bleed was stable  CTA PE was stable  Hemoglobin 14.4 > 14.6; pt feels as though the BRBPR has improved   On recheck pt overall is feeling much better, she denies and current DIB or CP, no hypoxia, still having some bleeding but feels as though much improved, HDS  Notified [facility] Dr Wil[place] of consult (pt f/w [facility] primary care)  Will plan to admit    .Critical Care  Performed by: Elnor Savant LABOR, DO Authorized by: Elnor Savant LABOR, DO   Critical care provider statement:    Critical care time (minutes):  30   Critical care time was exclusive of:  Separately billable procedures and treating other patients   Critical care was time spent personally by me on the following activities:  Development of treatment plan with patient or surrogate, discussions with consultants, evaluation of patient's response to treatment, examination of patient, ordering and review of laboratory studies, ordering and review of radiographic studies, ordering and performing treatments and interventions, pulse oximetry, re-evaluation of patient's condition, review of old charts and obtaining history from patient or surrogate   Care discussed with: admitting provider     Final Clinical Impressions(s) / ED Diagnoses     ICD-10-CM   1. Lower GI bleed  K92.2     2. Anaphylaxis, initial encounter  T78.2XXA     3.  Hypokalemia  E87.6     4. Syncope, unspecified syncope type  R55       ED Discharge Orders          Ordered    EPINEPHrine  0.3 mg/0.3 mL IJ SOAJ injection  As needed        08/18/24 2100            Discharge Instructions   None        Elnor Savant LABOR, DO 08/22/24 1547

## 2024-08-19 DIAGNOSIS — K644 Residual hemorrhoidal skin tags: Secondary | ICD-10-CM | POA: Diagnosis not present

## 2024-08-19 DIAGNOSIS — K922 Gastrointestinal hemorrhage, unspecified: Secondary | ICD-10-CM | POA: Diagnosis not present

## 2024-08-19 DIAGNOSIS — K625 Hemorrhage of anus and rectum: Secondary | ICD-10-CM

## 2024-08-19 DIAGNOSIS — D62 Acute posthemorrhagic anemia: Secondary | ICD-10-CM

## 2024-08-19 DIAGNOSIS — K641 Second degree hemorrhoids: Secondary | ICD-10-CM | POA: Diagnosis not present

## 2024-08-19 DIAGNOSIS — K529 Noninfective gastroenteritis and colitis, unspecified: Secondary | ICD-10-CM | POA: Diagnosis not present

## 2024-08-19 LAB — BASIC METABOLIC PANEL WITH GFR
Anion gap: 11 (ref 5–15)
BUN: 15 mg/dL (ref 8–23)
CO2: 18 mmol/L — ABNORMAL LOW (ref 22–32)
Calcium: 8.6 mg/dL — ABNORMAL LOW (ref 8.9–10.3)
Chloride: 113 mmol/L — ABNORMAL HIGH (ref 98–111)
Creatinine, Ser: 0.81 mg/dL (ref 0.44–1.00)
GFR, Estimated: >60 mL/min (ref >60)
Glucose, Bld: 120 mg/dL — ABNORMAL HIGH (ref 70–99)
Potassium: 5 mmol/L (ref 3.5–5.1)
Sodium: 141 mmol/L (ref 135–145)

## 2024-08-19 LAB — HEMOGLOBIN AND HEMATOCRIT, BLOOD
HCT: 39 % (ref 36.0–46.0)
HCT: 38.7 % (ref 36.0–46.0)
Hemoglobin: 12.6 g/dL (ref 12.0–15.0)
Hemoglobin: 12.3 g/dL (ref 12.0–15.0)

## 2024-08-19 LAB — CBC
HCT: 39.9 % (ref 36.0–46.0)
Hemoglobin: 12.5 g/dL (ref 12.0–15.0)
MCH: 28 pg (ref 26.0–34.0)
MCHC: 31.3 g/dL (ref 30.0–36.0)
MCV: 89.3 fL (ref 80.0–100.0)
Platelets: 215 K/uL (ref 150–400)
RBC: 4.47 MIL/uL (ref 3.87–5.11)
RDW: 15.2 % (ref 11.5–15.5)
WBC: 27.7 K/uL — ABNORMAL HIGH (ref 4.0–10.5)
nRBC: 0 % (ref 0.0–0.2)

## 2024-08-19 MED ORDER — BISACODYL 5 MG PO TBEC
10.0000 mg | DELAYED_RELEASE_TABLET | Freq: Once | ORAL | Status: AC
  Administered 2024-08-19: 10 mg via ORAL
  Filled 2024-08-19: qty 2

## 2024-08-19 MED ORDER — PANTOPRAZOLE SODIUM 40 MG IV SOLR
40.0000 mg | Freq: Two times a day (BID) | INTRAVENOUS | Status: DC
  Administered 2024-08-19 – 2024-08-20 (×4): 40 mg via INTRAVENOUS
  Filled 2024-08-19 (×4): qty 10

## 2024-08-19 MED ORDER — SIMETHICONE 80 MG PO CHEW
240.0000 mg | CHEWABLE_TABLET | Freq: Once | ORAL | Status: AC
  Administered 2024-08-19: 240 mg via ORAL
  Filled 2024-08-19: qty 3

## 2024-08-19 MED ORDER — NA SULFATE-K SULFATE-MG SULF 17.5-3.13-1.6 GM/177ML PO SOLN
0.5000 | Freq: Once | ORAL | Status: AC
  Administered 2024-08-19: 177 mL via ORAL
  Filled 2024-08-19: qty 1

## 2024-08-19 MED ORDER — CALCIUM GLUCONATE-NACL 1-0.675 GM/50ML-% IV SOLN
1.0000 g | Freq: Once | INTRAVENOUS | Status: AC
Start: 2024-08-19 — End: 2024-08-19
  Administered 2024-08-19: 1000 mg via INTRAVENOUS
  Filled 2024-08-19: qty 50

## 2024-08-19 MED ORDER — LATANOPROST 0.005 % OP SOLN
1.0000 [drp] | Freq: Every day | OPHTHALMIC | Status: DC
  Administered 2024-08-19: 1 [drp] via OPHTHALMIC
  Filled 2024-08-19: qty 2.5

## 2024-08-19 MED ORDER — NA SULFATE-K SULFATE-MG SULF 17.5-3.13-1.6 GM/177ML PO SOLN
0.5000 | Freq: Once | ORAL | Status: AC
  Administered 2024-08-19: 177 mL via ORAL

## 2024-08-19 MED ORDER — SODIUM CHLORIDE 0.9 % IV SOLN: INTRAVENOUS | Status: AC

## 2024-08-19 MED ORDER — LACTATED RINGERS IV SOLN: INTRAVENOUS | Status: AC

## 2024-08-19 MED ORDER — POTASSIUM CHLORIDE CRYS ER 20 MEQ PO TBCR
40.0000 meq | EXTENDED_RELEASE_TABLET | Freq: Two times a day (BID) | ORAL | Status: AC
  Administered 2024-08-19 (×2): 40 meq via ORAL
  Filled 2024-08-19 (×2): qty 2

## 2024-08-19 MED ORDER — LEVOTHYROXINE SODIUM 75 MCG PO TABS
75.0000 ug | ORAL_TABLET | Freq: Every day | ORAL | Status: DC
Start: 2024-08-19 — End: 2024-08-20
  Administered 2024-08-19 – 2024-08-20 (×2): 75 ug via ORAL
  Filled 2024-08-19 (×2): qty 1

## 2024-08-19 NOTE — H&P (View-Only) (Signed)
 Gastroenterology Inpatient Consultation   Attending Requesting Consult [name], MD  [facility] Day: 2  Reason for Consult GI bleeding    History of Present Illness  Joanna White is a 83 y.o. female with a pmh significant for breast cancer, prior TIA, erythrocytosis, hypothyroidism, glaucoma, osteoporosis, hyperlipidemia.  The GI service is consulted for evaluation and management of GI bleeding.  The patient was in her normal state of health yesterday.  She was visiting her husband's grave site and had ant bites to her foot/leg.  She had some lightheadedness and subsequently made her way to the grocery store where she had a syncope occur.  There was concern for potential allergic reaction and she was given epinephrine .  She had urgency to use the restroom while on the floor of the grocery store but held on until she made her way to the hospital.  While in the hospital she had significant urgency and ended up having multiple loose bowel movements with bright red blood per rectum occur.  She underwent a CT angiography GI bleed scan with full results as below but was negative for active extravasation.  Patient has not had a bowel movement for the last 4 hours.  Today she is seen with her son at bedside.  She states she has not had any significant changes in her bowel habits prior to this.  This is the first time she has ever noted blood.  She is concerned that the epinephrine  she received could be a reason for her symptomatology.  She had a previous colonoscopy more than 15 years ago but has had Cologuard testing in the interim.  The last Cologuard was more than 5 years ago but per her report has been normal.  There is no family history of colon cancer.   GI Review of Systems Positive as above Negative for pyrosis, dysphagia, odynophagia, nausea, vomiting, melena   Review of Systems  General: Denies fevers/chills/weight loss unintentionally Cardiovascular: Denies chest  pain Pulmonary: Denies shortness of breath Gastroenterological: See HPI Genitourinary: Denies darkened urine Hematological: Denies easy bruising/bleeding Dermatological: Denies jaundice Psychological: Mood is stable   Histories  Past Medical Past Medical History:  Diagnosis Date   Glaucoma    Hypothyroid    Osteopenia 08/2011   h/o osteoporosis, s/p boniva for 5 yrs   Personal history of malignant neoplasm of breast 1998   Left   Past Surgical Past Surgical History:  Procedure Laterality Date   BREAST LUMPECTOMY  1998   Left   CESAREAN SECTION     x 2   DEXA  10/2015   osteopenia, T -1.1 spine, hip -1.7    GYN surgery     uterine lining removed for heavy bleeding and polyp   lymph node removal  1998   left axilla   Thumb surgery     bilateral   TOTAL KNEE ARTHROPLASTY Left 2016   [name])   Allergies Allergies  Allergen Reactions   Flonase  [Fluticasone Propionate] Anaphylaxis   Fluticasone Propionate Other (See Comments)    REACTION: throat tight   Family Family History  Problem Relation Age of Onset   Diabetes Brother        x2   Cancer Father 29       lung   Cancer Mother 62       MM   Coronary artery disease Brother 54       CABG   Diabetes Brother    Diabetes Maternal Grandmother  Vasculitis Nephew        wegener's vasculitis   Stroke Neg Hx    Social Social History   Socioeconomic History   Marital status: Widowed    Spouse name: Not on file   Number of children: 2   Years of education: Not on file   Highest education level: Not on file  Occupational History   Occupation: Retired  Tobacco Use   Smoking status: Never   Smokeless tobacco: Never  Vaping Use   Vaping status: Never Used  Substance and Sexual Activity   Alcohol use: Never   Drug use: No   Sexual activity: Not Currently  Other Topics Concern   Not on file  Social History Narrative   Caffeine: 2cups coffee/dayWidow-husband died of MI on operating table2 children,  grown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 Right   Are you currently employed ?    What is your current occupation? retired   Do you live at home alone?yes   Who lives with you?    What type of home do you live in: 1 story or 2 story?  Split level    Caffiene 2 coffee a day   Social Drivers of Corporate investment banker Strain: Low Risk  (02/10/2024)   Overall Financial Resource Strain (CARDIA)    Difficulty of Paying Living Expenses: Not hard at all  Food Insecurity: No Food Insecurity (08/19/2024)   Hunger Vital Sign    Worried About Running Out of Food in the Last Year: Never true    Ran Out of Food in the Last Year: Never true  Transportation Needs: No Transportation Needs (08/19/2024)   PRAPARE - Administrator, Civil Service (Medical): No    Lack of Transportation (Non-Medical): No  Physical Activity: Sufficiently Active (02/10/2024)   Exercise Vital Sign    Days of Exercise per Week: 6 days    Minutes of Exercise per Session: 30 min  Stress: No Stress Concern Present (02/10/2024)   [employer] of Occupational Health - Occupational Stress Questionnaire    Feeling of Stress : Not at all  Social Connections: Moderately Isolated (08/19/2024)   Social Connection and Isolation Panel    Frequency of Communication with Friends and Family: More than three times a week    Frequency of Social Gatherings with Friends and Family: More than three times a week    Attends Religious Services: More than 4 times per year    Active Member of [employer] or Organizations: No    Attends Banker Meetings: Never    Marital Status: Widowed  Intimate Partner Violence: Not At Risk (08/19/2024)   Humiliation, Afraid, Rape, and Kick questionnaire    Fear of Current or Ex-Partner: No    Emotionally Abused: No    Physically Abused: No    Sexually Abused: No    Medications  Home  Medications No current facility-administered medications on file prior to encounter.   Current Outpatient Medications on File Prior to Encounter  Medication Sig Dispense Refill   amoxicillin  (AMOXIL ) 500 MG capsule Take 500 mg by mouth as directed.     aspirin  EC 81 MG tablet Take 1 tablet (81 mg total) by mouth daily. Swallow whole.     Cholecalciferol (VITAMIN D3) 1000 units CAPS Take 1 capsule (1,000 Units total) by mouth daily. 30 capsule    dorzolamide-timolol (COSOPT) 2-0.5 %  ophthalmic solution Place 1 drop into both eyes 2 (two) times daily.     latanoprost  (XALATAN ) 0.005 % ophthalmic solution Place into both eyes at bedtime.      levothyroxine  (SYNTHROID ) 75 MCG tablet Take 1 tablet (75 mcg total) by mouth daily before breakfast. 90 tablet 4   Misc Natural Products (PUMPKIN SEED OIL PO) Take 1 capsule by mouth daily. Used this to replace the statin     OIL OF OREGANO PO Take by mouth.     Multiple Vitamin (MULTIVITAMIN) tablet Take 1 tablet by mouth daily. With calcium  (Patient not taking: Reported on 08/18/2024)     rosuvastatin  (CRESTOR ) 10 MG tablet TAKE 1 TABLET (10 MG TOTAL) BY MOUTH EVERY MONDAY, WEDNESDAY, AND FRIDAY. (Patient not taking: Reported on 08/18/2024) 40 tablet 4   Scheduled Inpatient Medications  bisacodyl   10 mg Oral Once   latanoprost   1 drop Both Eyes QHS   levothyroxine   75 mcg Oral Q0600   Na Sulfate-K Sulfate-Mg Sulfate concentrate  0.5 kit Oral Once   Followed by   Na Sulfate-K Sulfate-Mg Sulfate concentrate  0.5 kit Oral Once   pantoprazole  (PROTONIX ) IV  40 mg Intravenous BID   simethicone   240 mg Oral Once   Followed by   simethicone   240 mg Oral Once   Continuous Inpatient Infusions  sodium chloride      lactated ringers  100 mL/hr at 08/19/24 0906   PRN Inpatient Medications acetaminophen , melatonin, polyethylene glycol, prochlorperazine    Physical Examination  BP (!) 151/67 (BP Location: Left Arm)   Pulse (!) 102   Temp 98.5 F (36.9 C)  (Oral)   Resp 18   SpO2 96%  GEN: NAD, appears stated age, doesn't appear chronically ill, son at bedside PSYCH: Cooperative, without pressured speech EYE: Conjunctivae pink, sclerae anicteric ENT: MMM CV: Nontachycardic RESP: No audible wheezing GI: NABS, soft, protuberant abdomen, rounded, NT, without rebound or guarding MSK/EXT: No significant lower extremity edema SKIN: No jaundice NEURO:  Alert & Oriented x 3, no focal deficits   Review of Data  I reviewed the following data at the time of this encounter:  Laboratory Studies   Recent Labs  Lab 08/18/24 1605 08/19/24 0732  NA  --  141  K  --  5.0  CL  --  113*  CO2  --  18*  BUN  --  15  CREATININE  --  0.81  GLUCOSE  --  120*  CALCIUM   --  8.6*  MG 1.9  --    Recent Labs  Lab 08/18/24 1605  AST 28  ALT 13  ALKPHOS 52    Recent Labs  Lab 08/18/24 1505 08/18/24 1932 08/19/24 0916  WBC 21.2*  --  27.7*  HGB 14.4   < > 12.5  HCT 47.3*   < > 39.9  PLT 217  --  215   < > = values in this interval not displayed.   Recent Labs  Lab 08/18/24 1605  INR 1.1   Imaging Studies  CT angio GI bleed scan IMPRESSION: VASCULAR Aortoiliac atherosclerosis.  No aneurysm or dissection.   No contrast extravasation within the bowel to localize GI bleed.   NON-VASCULAR   Trace free fluid in the pelvis.   Otherwise no acute findings in the abdomen or pelvis.  GI Procedures and Studies  No relevant studies to review   Assessment  Ms. Wadle is a 83 y.o. female.  The GI service is consulted for evaluation and management of  acute blood loss and bright red blood per rectum. The patient is hemodynamically and clinically stable at this time.  Etiology of her GI bleed is not clear though 1 could be arise ischemia from the significant mount of epinephrine  that she received could have led to issues within the colon.  Though imaging did not show evidence of abnormality on cross-sectional imaging which normally would  show issues of ischemic colitis.  In any case, a diagnostic colonoscopy is recommended as it has been a year since any form of colon cancer screening to ensure no evidence of a polyp or mass or other lesion could be a source for bleeding.  The risks of an EUS including intestinal perforation, bleeding, infection, aspiration, and medication effects were discussed as was the possibility it may not give a definitive diagnosis if a biopsy is performed.  When a biopsy of the pancreas is done as part of the EUS, there is an additional risk of pancreatitis at the rate of about 1-2%.  It was explained that procedure related pancreatitis is typically mild, although it can be severe and even life threatening, which is why we do not perform random pancreatic biopsies and only biopsy a lesion/area we feel is concerning enough to warrant the risk.  I discussed this with the patient and son as to whether we would pursue this as an inpatient versus consider as an outpatient and they are okay to move forward as an inpatient.  Will plan colonoscopy tomorrow with myself or my partner Dr. Stacia.  Preparation orders will be put in place.  All patient questions were answered to the best of my ability, and the patient agrees to the aforementioned plan of action with follow-up as indicated.   Plan/Recommendations  Clear liquid diet currently N.p.o. at midnight for Colonoscopy on 9/21 Simethicone  x 1 dose at 2 PM Simethicone  x 1 dose at 10 PM Dulcolax 10 mg oral x 1 dose at 2 PM One half Suprep 4 PM One half Suprep 10 PM Hold heparin VTE prophylaxis tomorrow   Thank you for this consult.  We will continue to follow.  Please page/call with questions or concerns.   Aloha Finner, MD [facility] Office # [phone]

## 2024-08-19 NOTE — TOC Progression Note (Signed)
 Transition of Care [facility]) - Progression Note    Patient Details  Name: Joanna White MRN: [id number] Date of Birth: 1940/12/09  [facility]) CM/SW Contact  Heather DELENA Saltness, LCSW Phone Number: 08/19/2024, 5:18 PM  Clinical Narrative:     5:20 PM - CSW attempted to give MOON to pt at bedside. Pt was in the bathroom. CSW will try again at a later time.  5 PM - CSW attempted to give MOON to pt at bedside. Pt was in the bathroom. CSW will try again at a later time.  4:30 PM - CSW attempted to give MOON to pt at bedside. Pt was in the bathroom. CSW will try again at a later time.   Expected Discharge Plan and Services        Social Drivers of Health (SDOH) Interventions SDOH Screenings   Food Insecurity: No Food Insecurity (08/19/2024)  Housing: Low Risk  (08/19/2024)  Transportation Needs: No Transportation Needs (08/19/2024)  Utilities: Not At Risk (08/19/2024)  Alcohol Screen: Low Risk  (02/10/2024)  Depression (PHQ2-9): Low Risk  (07/03/2024)  Financial Resource Strain: Low Risk  (02/10/2024)  Physical Activity: Sufficiently Active (02/10/2024)  Social Connections: Moderately Isolated (08/19/2024)  Stress: No Stress Concern Present (02/10/2024)  Tobacco Use: Low Risk  (07/03/2024)  Health Literacy: Adequate Health Literacy (02/10/2024)    Readmission Risk Interventions     No data to display          Signed: Heather Saltness, MSW, LCSW Clinical Social Worker Inpatient Care Management 08/19/2024 5:21 PM

## 2024-08-19 NOTE — ED Notes (Signed)
Report given to the 4th floor.

## 2024-08-19 NOTE — Progress Notes (Signed)
 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  [place] her husband's town and was swarmed by ants on her right foot.  Subsequently went to a nearby [employer] where she had an episode of loss of consciousness.  She felt dizzy and lightheaded and a sudden urge of defecation.  EMS was activated she received intramuscular epi.  She was placed on epinephrine  drip via route by EMS due to hypotension.  Right foot was swollen by ants bites.  In the ED the foot swelling completely resolved.  She had multiple watery stool in the ED.  Color went from brown to bloody.  Pressure in the ED was in the 120 range.  Hemoglobin 14.  Admitted for further evaluation.   Assessment & Plan:   Principal Problem:   Lower GI bleed   1-Acute lower GI bleed: - Patient developed bloody stool on admission.  She reports mild abdominal pain like a burning sensation. - Continue to monitor hemoglobin so far at 12. - IV Protonix  - GI planning colonoscopy tomorrow  Syncope:  - CT angio chest:No evidence of pulmonary embolism. Subtle geographic patchy hazy attenuation of the lungs which can be seen in small airways disease. - To be related to GI bleed, suspect hypotension. - Monitor on telemetry -Continue IV fluids  Hypernatremia Metabolic acidosis -In the setting of hypovolemia and diarrhea - Continue with IV fluids  Hypocalcemia - Received calcium  gluconate.  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 Timmy. -Monitor hb.   History of TIA on aspirin  - Holding aspirin  in the setting of GI bleed  Hypothyroidism - Continue  Synthroid   Recent treated dental infection - Completed treatment on 9/11  Hypokalemia: - Replace  Mildly elevated troponin Mild   Leukocytosis suspect worsening white count in the setting of IV steroids Follow trends.  Diarrhea: Check for C. Difficile  Allergic reaction: Bite multiple ants, she had a swelling of her feet.  She received IV steroid and received epi by EMS in  route.  Right foot looks better.  No significant edema  Estimated body mass index is 28.8 kg/m as calculated from the following:   Height as of 07/03/24: 4' 11.5 (1.511 m).   Weight as of 07/03/24: 65.8 kg.   DVT prophylaxis: SCDs Code Status: Full code Family Communication: Family  at bedside Disposition Plan:  Status is: Observation The patient will require care spanning > 2 midnights and should be moved to inpatient because:     Consultants:  GI  Procedures:  none Antimicrobials:    Subjective: She is alert and conversant, reporting no abdominal pain right now she was having pain was like a burning pain.  Bowel movement bloody this morning.  Objective: Vitals:   08/18/24 2200 08/19/24 0000 08/19/24 0248 08/19/24 0428  BP:  (!) 119/51 125/61 (!) 151/67  Pulse:  95 91 (!) 102  Resp:  16 18 18   Temp: 100 F (37.8 C)  99.4 F (37.4 C) 98.5 F (36.9 C)  TempSrc: Oral  Oral Oral  SpO2:  94% 95% 96%    Intake/Output Summary (Last 24 hours) at 08/19/2024 0745 Last data filed at 08/19/2024 0523 Gross per 24 hour  Intake  1588.5 ml  Output --  Net 1588.5 ml   There were no vitals filed for this visit.  Examination:  General exam: Appears calm and comfortable  Respiratory system: Clear to auscultation. Respiratory effort normal. Cardiovascular system: S1 & S2 heard, RRR. No JVD, murmurs, rubs, gallops or clicks. No pedal edema. Gastrointestinal system: Abdomen is nondistended, soft and nontender. No organomegaly or masses felt. Normal bowel sounds heard. Central nervous system: Alert and  oriented. No focal neurological deficits. Extremities: Symmetric 5 x 5 power.  Right foot with multiple small lesions     Data Reviewed: I have personally reviewed following labs and imaging studies  CBC: Recent Labs  Lab 08/18/24 1505 08/18/24 1932 08/19/24 0324  WBC 21.2*  --   --   NEUTROABS 18.5*  --   --   HGB 14.4 14.6 12.6  HCT 47.3* 45.5 39.0  MCV 91.0  --   --   PLT 217  --   --    Basic Metabolic Panel: Recent Labs  Lab 08/18/24 1400 08/18/24 1605  NA 146*  --   K 3.1*  --   CL 117*  --   CO2 15*  --   GLUCOSE 115*  --   BUN 14  --   CREATININE 0.67  --   CALCIUM  6.7*  --   MG  --  1.9   GFR: CrCl cannot be calculated (Unknown ideal weight.). Liver Function Tests: Recent Labs  Lab 08/18/24 1605  AST 28  ALT 13  ALKPHOS 52  BILITOT 0.7  PROT 6.3*  ALBUMIN 3.8   No results for input(s): LIPASE, AMYLASE in the last 168 hours. No results for input(s): AMMONIA in the last 168 hours. Coagulation Profile: Recent Labs  Lab 08/18/24 1605  INR 1.1   Cardiac Enzymes: No results for input(s): CKTOTAL, CKMB, CKMBINDEX, TROPONINI in the last 168 hours. BNP (last 3 results) No results for input(s): PROBNP in the last 8760 hours. HbA1C: No results for input(s): HGBA1C in the last 72 hours. CBG: No results for input(s): GLUCAP in the last 168 hours. Lipid Profile: No results for input(s): CHOL, HDL, LDLCALC, TRIG, CHOLHDL, LDLDIRECT in the last 72 hours. Thyroid  Function Tests: No results for input(s): TSH, T4TOTAL, FREET4, T3FREE, THYROIDAB in the last 72 hours. Anemia Panel: No results for input(s): VITAMINB12, FOLATE, FERRITIN, TIBC, IRON, RETICCTPCT in the last 72 hours. Sepsis Labs: No results for input(s): PROCALCITON, LATICACIDVEN in the last 168 hours.  No results found for this or any previous visit (from the past 240 hours).       Radiology Studies: CT Angio Chest PE W and/or  Wo Contrast Result Date: 08/18/2024 CLINICAL DATA:  Positive D-dimer. Dizziness and hypotension. Syncopal episode. Possible pulmonary embolism. EXAM: CT ANGIOGRAPHY CHEST WITH CONTRAST TECHNIQUE: Multidetector CT imaging of the chest was performed using the standard protocol during bolus administration of intravenous contrast. Multiplanar CT image reconstructions and MIPs were obtained to evaluate the vascular anatomy. RADIATION DOSE REDUCTION: This exam was performed according to the departmental dose-optimization program which includes automated exposure control, adjustment of the mA and/or kV according to patient size and/or use of iterative reconstruction technique. CONTRAST:  75mL OMNIPAQUE  IOHEXOL  350 MG/ML SOLN COMPARISON:  None Available. FINDINGS: Cardiovascular: Heart is normal size. Calcified plaque over the 3 vessel coronary arteries. Thoracic aorta is normal in caliber. Mild calcified plaque over the descending thoracic aorta. Pulmonary arterial system is well opacified without evidence of emboli. Mediastinum/Nodes: No mediastinal or hilar adenopathy. Remaining  mediastinal structures are unremarkable. Lungs/Pleura: Lungs are adequately inflated without lobar consolidation or effusion. Subtle geographic patchy hazy attenuation of the lungs which can be seen in small airways disease. Mild overall narrowing of the central airways. Upper Abdomen: Calcified plaque over the abdominal aorta. No acute findings. Musculoskeletal: No focal abnormality. Review of the MIP images confirms the above findings. IMPRESSION: 1. No evidence of pulmonary embolism. 2. Subtle geographic patchy hazy attenuation of the lungs which can be seen in small airways disease. Mild overall narrowing of the central airways. 3. Aortic atherosclerosis. Atherosclerotic coronary artery disease. Aortic Atherosclerosis (ICD10-I70.0). Electronically Signed   By: Toribio Agreste M.D.   On: 08/18/2024 20:01   CT ANGIO GI BLEED Result Date:  08/18/2024 CLINICAL DATA:  Bright red blood per rectum EXAM: CTA ABDOMEN AND PELVIS WITHOUT AND WITH CONTRAST TECHNIQUE: Multidetector CT imaging of the abdomen and pelvis was performed using the standard protocol during bolus administration of intravenous contrast. Multiplanar reconstructed images and MIPs were obtained and reviewed to evaluate the vascular anatomy. RADIATION DOSE REDUCTION: This exam was performed according to the departmental dose-optimization program which includes automated exposure control, adjustment of the mA and/or kV according to patient size and/or use of iterative reconstruction technique. CONTRAST:  OMNIPAQUE  IOHEXOL  350 MG/ML SOLN COMPARISON:  None available FINDINGS: VASCULAR Aorta: Normal caliber aorta without aneurysm, dissection, vasculitis or significant stenosis. Aortic atherosclerosis. Celiac: Patent SMA: Patent Renals: Patent IMA: Patent Inflow: Patent without evidence of aneurysm, dissection, vasculitis or significant stenosis. Atherosclerotic calcifications. Proximal Outflow: Bilateral common femoral and visualized portions of the superficial and profunda femoral arteries are patent without evidence of aneurysm, dissection, vasculitis or significant stenosis. Veins: No obvious venous abnormality within the limitations of this arterial phase study. Review of the MIP images confirms the above findings. NON-VASCULAR Lower chest: No acute findings. Hepatobiliary: No focal hepatic abnormality. Gallbladder unremarkable. Pancreas: No focal abnormality or ductal dilatation. Spleen: No focal abnormality.  Normal size. Adrenals/Urinary Tract: No adrenal abnormality. No focal renal abnormality. No stones or hydronephrosis. Urinary bladder is unremarkable. Stomach/Bowel: No active contrast extravasation to localize GI bleed. Stomach, large and small bowel grossly unremarkable. No obstruction or inflammatory process. Lymphatic: No adenopathy Reproductive: Uterus and adnexa  unremarkable.  No mass. Other: Trace free fluid in the cul-de-sac.  No free air. Musculoskeletal: No acute bony abnormality. IMPRESSION: VASCULAR Aortoiliac atherosclerosis.  No aneurysm or dissection. No contrast extravasation within the bowel to localize GI bleed. NON-VASCULAR Trace free fluid in the pelvis. Otherwise no acute findings in the abdomen or pelvis. Electronically Signed   By: Franky Crease M.D.   On: 08/18/2024 17:25   DG Chest Port 1 View Result Date: 08/18/2024 CLINICAL DATA:  Shortness of breath. EXAM: PORTABLE CHEST 1 VIEW COMPARISON:  06/20/2012 FINDINGS: Lungs are adequately inflated without focal airspace consolidation, effusion or pneumothorax. Cardiomediastinal silhouette and remainder of the exam is unchanged. IMPRESSION: No active disease. Electronically Signed   By: Toribio Agreste M.D.   On: 08/18/2024 14:18        Scheduled Meds:  latanoprost   1 drop Both Eyes QHS   levothyroxine   75 mcg Oral Q0600   pantoprazole  (PROTONIX ) IV  40 mg Intravenous BID   potassium chloride   40 mEq Oral BID   Continuous Infusions:  sodium chloride  75 mL/hr at 08/19/24 0438     LOS: 0 days    Time spent: 35 minutes    Saed Hudlow A Adya Wirz, MD [facility]   If 7PM-7AM, please contact night-coverage [email or web address]  08/19/2024, 7:45 AM

## 2024-08-19 NOTE — Consult Note (Signed)
 Gastroenterology Inpatient Consultation   Attending Requesting Consult Joanna White LABOR, MD  [facility] Day: 2  Reason for Consult GI bleeding    History of Present Illness  Joanna White is a 83 y.o. female with a pmh significant for breast cancer, prior TIA, erythrocytosis, hypothyroidism, glaucoma, osteoporosis, hyperlipidemia.  The GI service is consulted for evaluation and management of GI bleeding.  The patient was in her normal state of health yesterday.  She was visiting her husband's grave site and had ant bites to her foot/leg.  She had some lightheadedness and subsequently made her way to the grocery store where she had a syncope occur.  There was concern for potential allergic reaction and she was given epinephrine .  She had urgency to use the restroom while on the floor of the grocery store but held on until she made her way to the hospital.  While in the hospital she had significant urgency and ended up having multiple loose bowel movements with bright red blood per rectum occur.  She underwent a CT angiography GI bleed scan with full results as below but was negative for active extravasation.  Patient has not had a bowel movement for the last 4 hours.  Today she is seen with her son at bedside.  She states she has not had any significant changes in her bowel habits prior to this.  This is the first time she has ever noted blood.  She is concerned that the epinephrine  she received could be a reason for her symptomatology.  She had a previous colonoscopy more than 15 years ago but has had Cologuard testing in the interim.  The last Cologuard was more than 5 years ago but per her report has been normal.  There is no family history of colon cancer.   GI Review of Systems Positive as above Negative for pyrosis, dysphagia, odynophagia, nausea, vomiting, melena   Review of Systems  General: Denies fevers/chills/weight loss unintentionally Cardiovascular: Denies chest  pain Pulmonary: Denies shortness of breath Gastroenterological: See HPI Genitourinary: Denies darkened urine Hematological: Denies easy bruising/bleeding Dermatological: Denies jaundice Psychological: Mood is stable   Histories  Past Medical Past Medical History:  Diagnosis Date   Glaucoma    Hypothyroid    Osteopenia 08/2011   h/o osteoporosis, s/p boniva for 5 yrs   Personal history of malignant neoplasm of breast 1998   Left   Past Surgical Past Surgical History:  Procedure Laterality Date   BREAST LUMPECTOMY  1998   Left   CESAREAN SECTION     x 2   DEXA  10/2015   osteopenia, T -1.1 spine, hip -1.7    GYN surgery     uterine lining removed for heavy bleeding and polyp   lymph node removal  1998   left axilla   Thumb surgery     bilateral   TOTAL KNEE ARTHROPLASTY Left 2016   Joanna White)   Allergies Allergies  Allergen Reactions   Flonase  [Fluticasone Propionate] Anaphylaxis   Fluticasone Propionate Other (See Comments)    REACTION: throat tight   Family Family History  Problem Relation Age of Onset   Diabetes Brother        x2   Cancer Father 29       lung   Cancer Mother 62       MM   Coronary artery disease Brother 54       CABG   Diabetes Brother    Diabetes Maternal Grandmother  Vasculitis Nephew        wegener's vasculitis   Stroke Neg Hx    Social Social History   Socioeconomic History   Marital status: Widowed    Spouse name: Not on file   Number of children: 2   Years of education: Not on file   Highest education level: Not on file  Occupational History   Occupation: Retired  Tobacco Use   Smoking status: Never   Smokeless tobacco: Never  Vaping Use   Vaping status: Never Used  Substance and Sexual Activity   Alcohol use: Never   Drug use: No   Sexual activity: Not Currently  Other Topics Concern   Not on file  Social History Narrative   Caffeine: 2cups coffee/dayWidow-husband died of MI on operating table2 children,  grown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 Right   Are you currently employed ?    What is your current occupation? retired   Do you live at home alone?yes   Who lives with you?    What type of home do you live in: 1 story or 2 story?  Split level    Caffiene 2 coffee a day   Social Drivers of Corporate investment banker Strain: Low Risk  (02/10/2024)   Overall Financial Resource Strain (CARDIA)    Difficulty of Paying Living Expenses: Not hard at all  Food Insecurity: No Food Insecurity (08/19/2024)   Hunger Vital Sign    Worried About Running Out of Food in the Last Year: Never true    Ran Out of Food in the Last Year: Never true  Transportation Needs: No Transportation Needs (08/19/2024)   PRAPARE - Administrator, Civil Service (Medical): No    Lack of Transportation (Non-Medical): No  Physical Activity: Sufficiently Active (02/10/2024)   Exercise Vital Sign    Days of Exercise per Week: 6 days    Minutes of Exercise per Session: 30 min  Stress: No Stress Concern Present (02/10/2024)   [employer] of Occupational Health - Occupational Stress Questionnaire    Feeling of Stress : Not at all  Social Connections: Moderately Isolated (08/19/2024)   Social Connection and Isolation Panel    Frequency of Communication with Friends and Family: More than three times a week    Frequency of Social Gatherings with Friends and Family: More than three times a week    Attends Religious Services: More than 4 times per year    Active Member of [employer] or Organizations: No    Attends Banker Meetings: Never    Marital Status: Widowed  Intimate Partner Violence: Not At Risk (08/19/2024)   Humiliation, Afraid, Rape, and Kick questionnaire    Fear of Current or Ex-Partner: No    Emotionally Abused: No    Physically Abused: No    Sexually Abused: No    Medications  Home  Medications No current facility-administered medications on file prior to encounter.   Current Outpatient Medications on File Prior to Encounter  Medication Sig Dispense Refill   amoxicillin  (AMOXIL ) 500 MG capsule Take 500 mg by mouth as directed.     aspirin  EC 81 MG tablet Take 1 tablet (81 mg total) by mouth daily. Swallow whole.     Cholecalciferol (VITAMIN D3) 1000 units CAPS Take 1 capsule (1,000 Units total) by mouth daily. 30 capsule    dorzolamide-timolol (COSOPT) 2-0.5 %  ophthalmic solution Place 1 drop into both eyes 2 (two) times daily.     latanoprost  (XALATAN ) 0.005 % ophthalmic solution Place into both eyes at bedtime.      levothyroxine  (SYNTHROID ) 75 MCG tablet Take 1 tablet (75 mcg total) by mouth daily before breakfast. 90 tablet 4   Misc Natural Products (PUMPKIN SEED OIL PO) Take 1 capsule by mouth daily. Used this to replace the statin     OIL OF OREGANO PO Take by mouth.     Multiple Vitamin (MULTIVITAMIN) tablet Take 1 tablet by mouth daily. With calcium  (Patient not taking: Reported on 08/18/2024)     rosuvastatin  (CRESTOR ) 10 MG tablet TAKE 1 TABLET (10 MG TOTAL) BY MOUTH EVERY MONDAY, WEDNESDAY, AND FRIDAY. (Patient not taking: Reported on 08/18/2024) 40 tablet 4   Scheduled Inpatient Medications  bisacodyl   10 mg Oral Once   latanoprost   1 drop Both Eyes QHS   levothyroxine   75 mcg Oral Q0600   Na Sulfate-K Sulfate-Mg Sulfate concentrate  0.5 kit Oral Once   Followed by   Na Sulfate-K Sulfate-Mg Sulfate concentrate  0.5 kit Oral Once   pantoprazole  (PROTONIX ) IV  40 mg Intravenous BID   simethicone   240 mg Oral Once   Followed by   simethicone   240 mg Oral Once   Continuous Inpatient Infusions  sodium chloride      lactated ringers  100 mL/hr at 08/19/24 0906   PRN Inpatient Medications acetaminophen , melatonin, polyethylene glycol, prochlorperazine    Physical Examination  BP (!) 151/67 (BP Location: Left Arm)   Pulse (!) 102   Temp 98.5 F (36.9 C)  (Oral)   Resp 18   SpO2 96%  GEN: NAD, appears stated age, doesn't appear chronically ill, son at bedside PSYCH: Cooperative, without pressured speech EYE: Conjunctivae pink, sclerae anicteric ENT: MMM CV: Nontachycardic RESP: No audible wheezing GI: NABS, soft, protuberant abdomen, rounded, NT, without rebound or guarding MSK/EXT: No significant lower extremity edema SKIN: No jaundice NEURO:  Alert & Oriented x 3, no focal deficits   Review of Data  I reviewed the following data at the time of this encounter:  Laboratory Studies   Recent Labs  Lab 08/18/24 1605 08/19/24 0732  NA  --  141  K  --  5.0  CL  --  113*  CO2  --  18*  BUN  --  15  CREATININE  --  0.81  GLUCOSE  --  120*  CALCIUM   --  8.6*  MG 1.9  --    Recent Labs  Lab 08/18/24 1605  AST 28  ALT 13  ALKPHOS 52    Recent Labs  Lab 08/18/24 1505 08/18/24 1932 08/19/24 0916  WBC 21.2*  --  27.7*  HGB 14.4   < > 12.5  HCT 47.3*   < > 39.9  PLT 217  --  215   < > = values in this interval not displayed.   Recent Labs  Lab 08/18/24 1605  INR 1.1   Imaging Studies  CT angio GI bleed scan IMPRESSION: VASCULAR Aortoiliac atherosclerosis.  No aneurysm or dissection.   No contrast extravasation within the bowel to localize GI bleed.   NON-VASCULAR   Trace free fluid in the pelvis.   Otherwise no acute findings in the abdomen or pelvis.  GI Procedures and Studies  No relevant studies to review   Assessment  Ms. Wadle is a 83 y.o. female.  The GI service is consulted for evaluation and management of  acute blood loss and bright red blood per rectum. The patient is hemodynamically and clinically stable at this time.  Etiology of her GI bleed is not clear though 1 could be arise ischemia from the significant mount of epinephrine  that she received could have led to issues within the colon.  Though imaging did not show evidence of abnormality on cross-sectional imaging which normally would  show issues of ischemic colitis.  In any case, a diagnostic colonoscopy is recommended as it has been a year since any form of colon cancer screening to ensure no evidence of a polyp or mass or other lesion could be a source for bleeding.  The risks of an EUS including intestinal perforation, bleeding, infection, aspiration, and medication effects were discussed as was the possibility it may not give a definitive diagnosis if a biopsy is performed.  When a biopsy of the pancreas is done as part of the EUS, there is an additional risk of pancreatitis at the rate of about 1-2%.  It was explained that procedure related pancreatitis is typically mild, although it can be severe and even life threatening, which is why we do not perform random pancreatic biopsies and only biopsy a lesion/area we feel is concerning enough to warrant the risk.  I discussed this with the patient and son as to whether we would pursue this as an inpatient versus consider as an outpatient and they are okay to move forward as an inpatient.  Will plan colonoscopy tomorrow with myself or my partner Dr. Stacia.  Preparation orders will be put in place.  All patient questions were answered to the best of my ability, and the patient agrees to the aforementioned plan of action with follow-up as indicated.   Plan/Recommendations  Clear liquid diet currently N.p.o. at midnight for Colonoscopy on 9/21 Simethicone  x 1 dose at 2 PM Simethicone  x 1 dose at 10 PM Dulcolax 10 mg oral x 1 dose at 2 PM One half Suprep 4 PM One half Suprep 10 PM Hold heparin VTE prophylaxis tomorrow   Thank you for this consult.  We will continue to follow.  Please page/call with questions or concerns.   Aloha Finner, MD [facility] Office # [phone]

## 2024-08-20 ENCOUNTER — Observation Stay (HOSPITAL_COMMUNITY): Admitting: Anesthesiology

## 2024-08-20 ENCOUNTER — Encounter (HOSPITAL_COMMUNITY): Payer: Self-pay | Admitting: Internal Medicine

## 2024-08-20 ENCOUNTER — Observation Stay (HOSPITAL_BASED_OUTPATIENT_CLINIC_OR_DEPARTMENT_OTHER): Admitting: Anesthesiology

## 2024-08-20 ENCOUNTER — Encounter (HOSPITAL_COMMUNITY)
Admission: EM | Disposition: A | Discharge status: Home / Self Care | Payer: Self-pay | Source: Home / Self Care | Attending: Emergency Medicine

## 2024-08-20 DIAGNOSIS — K559 Vascular disorder of intestine, unspecified: Secondary | ICD-10-CM | POA: Diagnosis not present

## 2024-08-20 DIAGNOSIS — K529 Noninfective gastroenteritis and colitis, unspecified: Secondary | ICD-10-CM

## 2024-08-20 DIAGNOSIS — Z860100 Personal history of colon polyps, unspecified: Secondary | ICD-10-CM

## 2024-08-20 DIAGNOSIS — K641 Second degree hemorrhoids: Secondary | ICD-10-CM | POA: Diagnosis not present

## 2024-08-20 DIAGNOSIS — D122 Benign neoplasm of ascending colon: Secondary | ICD-10-CM

## 2024-08-20 DIAGNOSIS — E039 Hypothyroidism, unspecified: Secondary | ICD-10-CM

## 2024-08-20 DIAGNOSIS — K625 Hemorrhage of anus and rectum: Secondary | ICD-10-CM | POA: Diagnosis not present

## 2024-08-20 DIAGNOSIS — K644 Residual hemorrhoidal skin tags: Secondary | ICD-10-CM | POA: Diagnosis not present

## 2024-08-20 DIAGNOSIS — K55049 Acute infarction of large intestine, extent unspecified: Secondary | ICD-10-CM | POA: Diagnosis not present

## 2024-08-20 DIAGNOSIS — K922 Gastrointestinal hemorrhage, unspecified: Secondary | ICD-10-CM | POA: Diagnosis not present

## 2024-08-20 DIAGNOSIS — K51511 Left sided colitis with rectal bleeding: Secondary | ICD-10-CM | POA: Diagnosis not present

## 2024-08-20 DIAGNOSIS — I10 Essential (primary) hypertension: Secondary | ICD-10-CM | POA: Diagnosis not present

## 2024-08-20 DIAGNOSIS — K6289 Other specified diseases of anus and rectum: Secondary | ICD-10-CM | POA: Diagnosis not present

## 2024-08-20 DIAGNOSIS — D62 Acute posthemorrhagic anemia: Secondary | ICD-10-CM | POA: Diagnosis not present

## 2024-08-20 HISTORY — PX: COLONOSCOPY: SHX5424

## 2024-08-20 LAB — BASIC METABOLIC PANEL WITH GFR
Anion gap: 13 (ref 5–15)
BUN: 12 mg/dL (ref 8–23)
CO2: 20 mmol/L — ABNORMAL LOW (ref 22–32)
Calcium: 8.9 mg/dL (ref 8.9–10.3)
Chloride: 107 mmol/L (ref 98–111)
Creatinine, Ser: 0.75 mg/dL (ref 0.44–1.00)
GFR, Estimated: >60 mL/min (ref >60)
Glucose, Bld: 105 mg/dL — ABNORMAL HIGH (ref 70–99)
Potassium: 4 mmol/L (ref 3.5–5.1)
Sodium: 141 mmol/L (ref 135–145)

## 2024-08-20 LAB — CBC
HCT: 37.6 % (ref 36.0–46.0)
Hemoglobin: 11.6 g/dL — ABNORMAL LOW (ref 12.0–15.0)
MCH: 27.7 pg (ref 26.0–34.0)
MCHC: 30.9 g/dL (ref 30.0–36.0)
MCV: 89.7 fL (ref 80.0–100.0)
Platelets: 195 K/uL (ref 150–400)
RBC: 4.19 MIL/uL (ref 3.87–5.11)
RDW: 15.4 % (ref 11.5–15.5)
WBC: 21.8 K/uL — ABNORMAL HIGH (ref 4.0–10.5)
nRBC: 0 % (ref 0.0–0.2)

## 2024-08-20 LAB — PROTIME-INR
INR: 1.1 (ref 0.8–1.2)
Prothrombin Time: 14.6 s (ref 11.4–15.2)

## 2024-08-20 LAB — SEDIMENTATION RATE: Sed Rate: 13 mm/h (ref 0–22)

## 2024-08-20 LAB — C-REACTIVE PROTEIN: CRP: 5.1 mg/dL — ABNORMAL HIGH (ref <1.0)

## 2024-08-20 SURGERY — COLONOSCOPY
Anesthesia: Monitor Anesthesia Care

## 2024-08-20 MED ORDER — PANTOPRAZOLE SODIUM 40 MG PO TBEC: 40.0000 mg | DELAYED_RELEASE_TABLET | Freq: Every day | ORAL | 1 refills | Status: AC

## 2024-08-20 MED ORDER — AMOXICILLIN-POT CLAVULANATE 875-125 MG PO TABS: 1.0000 | ORAL_TABLET | Freq: Two times a day (BID) | ORAL | 0 refills | Status: AC

## 2024-08-20 MED ORDER — AMOXICILLIN-POT CLAVULANATE 875-125 MG PO TABS
1.0000 | ORAL_TABLET | Freq: Two times a day (BID) | ORAL | Status: DC
  Administered 2024-08-20: 1 via ORAL
  Filled 2024-08-20: qty 1

## 2024-08-20 MED ORDER — EPINEPHRINE 0.3 MG/0.3ML IJ SOAJ: 0.3000 mg | INTRAMUSCULAR | 0 refills | Status: AC | PRN

## 2024-08-20 MED ORDER — PROPOFOL 500 MG/50ML IV EMUL
INTRAVENOUS | Status: DC | PRN
  Administered 2024-08-20: 140 ug/kg/min via INTRAVENOUS

## 2024-08-20 MED ORDER — SODIUM CHLORIDE 0.9 % IV SOLN: INTRAVENOUS | Status: DC

## 2024-08-20 MED ORDER — PROPOFOL 10 MG/ML IV BOLUS
INTRAVENOUS | Status: DC | PRN
  Administered 2024-08-20 (×2): 30 mg via INTRAVENOUS

## 2024-08-20 NOTE — Plan of Care (Signed)

## 2024-08-20 NOTE — Op Note (Addendum)
 [facility] Patient Name: Joanna White Procedure Date: 08/20/2024 MRN: [id number] Attending MD: Aloha Finner , MD, [id number] Date of Birth: 01/12/1941 CSN: [id number] Age: 83 Admit Type: Inpatient Procedure:                Colonoscopy Indications:              Hematochezia, Acute post hemorrhagic anemia Providers:                Aloha Finner, MD, Randall Lines, RN, Lorrayne Kitty, Technician Referring MD:              Medicines:                Monitored Anesthesia Care Complications:            No immediate complications. Estimated Blood Loss:     Estimated blood loss was minimal. Procedure:                Pre-Anesthesia Assessment:                           - Prior to the procedure, a History and Physical                            was performed, and patient medications and                            allergies were reviewed. The patient's tolerance of                            previous anesthesia was also reviewed. The risks                            and benefits of the procedure and the sedation                            options and risks were discussed with the patient.                            All questions were answered, and informed consent                            was obtained. Prior Anticoagulants: The patient has                            taken no anticoagulant or antiplatelet agents. ASA                            Grade Assessment: III - A patient with severe                            systemic disease. After reviewing the risks and  benefits, the patient was deemed in satisfactory                            condition to undergo the procedure.                           After obtaining informed consent, the colonoscope                            was passed under direct vision. Throughout the                            procedure, the patient's blood pressure, pulse, and                             oxygen saturations were monitored continuously. The                            [id number]-HQ190L ([id number]) Olympus colonoscope was                            introduced through the anus and advanced to the the                            cecum, identified by appendiceal orifice and                            ileocecal valve. The colonoscopy was performed                            without difficulty. The patient tolerated the                            procedure. The quality of the bowel preparation was                            adequate. The ileocecal valve, appendiceal orifice,                            and rectum were photographed. Scope In: 7:59:56 AM Scope Out: 8:16:09 AM Scope Withdrawal Time: 0 hours 11 minutes 30 seconds  Total Procedure Duration: 0 hours 16 minutes 13 seconds  Findings:      The digital rectal exam findings include hemorrhoids. Pertinent       negatives include no palpable rectal lesions.      A 2 mm polyp was found in the ascending colon. The polyp was sessile.       The polyp was removed with a cold snare. Resection and retrieval were       complete.      Inflammation characterized by congestion (edema), erosions, friability,       granularity, loss of vascularity, mucus and shallow ulcerations was       found in a continuous and circumferential pattern from the rectum to the       splenic flexure. The transverse colon, the hepatic flexure, the  ascending colon and the cecum were spared. The inflammation was severe.       Biopsies were taken with a cold forceps for histology from the right       colon. Biopsies were taken with a cold forceps for histology from the       left colon. Biopsies were taken with a cold forceps for histology from       the rectum.      Non-bleeding non-thrombosed external and internal hemorrhoids were found       during endoscopy. The hemorrhoids were Grade II (internal hemorrhoids       that prolapse but reduce  spontaneously). Impression:               - Hemorrhoids found on digital rectal exam.                           - One 2 mm polyp in the ascending colon, removed                            with a cold snare. Resected and retrieved.                           - Inflammation was found confluent in nature from                            the rectum to the splenic flexure. This was severe.                            Query ischemic colitis vs rule out Chronic colitis.                            Biopsied.                           - Non-bleeding non-thrombosed external and internal                            hemorrhoids. Moderate Sedation:      Not Applicable - Patient had care per Anesthesia. Recommendation:           - The patient will be observed post-procedure,                            until all discharge criteria are met.                           - Return patient to hospital ward for possible                            discharge same day. She will have some bleeding                            today into tomorrow.                           - Advance diet as tolerated.                           -  Await pathology results.                           - Repeat colonoscopy for surveillance based on                            pathology results can be considered.                           - If patient is doing well, we can follow-up the                            pathology such that if chronic colitis was noted,                            then consideration of steroid therapy could be                            considered if patient continues to have issues.                            However, overall clinical scenario sounds more                            ischemic in nature (though distribution of findings                            is a bit different than would normally/typically be                            expected with ischemic colitis).                           - Will gie patient 3-days of  antibiotics to                            decrease risk of bacterial translocation in seting                            of significant inflammation and possible ischemic                            colitis (Augmentin  875 mg BID).                           - The findings and recommendations were discussed                            with the patient.                           - The findings and recommendations were discussed                            with the patient's  family.                           - The findings and recommendations were discussed                            with the referring physician. Procedure Code(s):        --- Professional ---                           [phone], Colonoscopy, flexible; with removal of                            tumor(s), polyp(s), or other lesion(s) by snare                            technique                           45380, 59, Colonoscopy, flexible; with biopsy,                            single or multiple Diagnosis Code(s):        --- Professional ---                           D12.2, Benign neoplasm of ascending colon                           K55.9, Vascular disorder of intestine, unspecified                           K64.1, Second degree hemorrhoids                           K92.1, Melena (includes Hematochezia)                           D62, Acute posthemorrhagic anemia CPT copyright 2022 American Medical Association. All rights reserved. The codes documented in this report are preliminary and upon coder review may  be revised to meet current compliance requirements. Aloha Finner, MD 08/20/2024 8:30:55 AM Number of Addenda: 0

## 2024-08-20 NOTE — Interval H&P Note (Signed)
 History and Physical Interval Note:  08/20/2024 7:24 AM  Joanna White  has presented today for surgery, with the diagnosis of BRBPR and Acute blood loss.  The various methods of treatment have been discussed with the patient and family. After consideration of risks, benefits and other options for treatment, the patient has consented to  Procedure(s): COLONOSCOPY (N/A) as a surgical intervention.  The patient's history has been reviewed, patient examined, no change in status, stable for surgery.  I have reviewed the patient's chart and labs.  Questions were answered to the patient's satisfaction.     Donaven Criswell Mansouraty Jr

## 2024-08-20 NOTE — Anesthesia Preprocedure Evaluation (Addendum)
 Anesthesia Evaluation  Patient identified by MRN, date of birth, ID band Patient awake    Reviewed: Allergy & Precautions, NPO status , Patient's Chart, lab work & pertinent test results  History of Anesthesia Complications Negative for: history of anesthetic complications  Airway Mallampati: III  TM Distance: <3 FB Neck ROM: Full    Dental  (+) Dental Advisory Given, Teeth Intact   Pulmonary neg shortness of breath, neg sleep apnea, neg COPD, neg recent URI   breath sounds clear to auscultation       Cardiovascular hypertension, (-) angina (-) Past MI and (-) CHF  Rhythm:Regular     Neuro/Psych negative neurological ROS  negative psych ROS   GI/Hepatic Neg liver ROS,,,? GI bleed    Endo/Other  Hypothyroidism    Renal/GU negative Renal ROSLab Results      Component                Value               Date                      NA                       141                 08/20/2024                K                        4.0                 08/20/2024                CO2                      20 (L)              08/20/2024                GLUCOSE                  105 (H)             08/20/2024                BUN                      12                  08/20/2024                CREATININE               0.75                08/20/2024                CALCIUM                   8.9                 08/20/2024                GFR                      65.19  02/25/2024                EGFR                     46 (L)              09/08/2017                GFRNONAA                 >60                 08/20/2024                Musculoskeletal  (+) Arthritis ,    Abdominal   Peds  Hematology  (+) Blood dyscrasia, anemia Lab Results      Component                Value               Date                      WBC                      21.8 (H)            08/20/2024                HGB                      11.6 (L)             08/20/2024                HCT                      37.6                08/20/2024                MCV                      89.7                08/20/2024                PLT                      195                 08/20/2024              Anesthesia Other Findings   Reproductive/Obstetrics                              Anesthesia Physical Anesthesia Plan  ASA: 2  Anesthesia Plan: MAC   Post-op Pain Management: Minimal or no pain anticipated   Induction: Intravenous  PONV Risk Score and Plan: 2 and Propofol  infusion and Treatment may vary due to age or medical condition  Airway Management Planned: Nasal Cannula, Natural Airway and Simple Face Mask  Additional Equipment: None  Intra-op Plan:   Post-operative Plan:   Informed Consent: I have reviewed the patients History and Physical, chart, labs and discussed the procedure including the risks, benefits and alternatives for the proposed anesthesia with the patient or authorized representative who has  indicated his/her understanding and acceptance.     Dental advisory given  Plan Discussed with: CRNA  Anesthesia Plan Comments:          Anesthesia Quick Evaluation

## 2024-08-20 NOTE — Transfer of Care (Signed)
 Immediate Anesthesia Transfer of Care Note  Patient: Joanna White  Procedure(s) Performed: COLONOSCOPY  Patient Location: PACU  Anesthesia Type:MAC  Level of Consciousness: drowsy and patient cooperative  Airway & Oxygen Therapy: Patient Spontanous Breathing and Patient connected to face mask oxygen  Post-op Assessment: Report given to RN and Post -op Vital signs reviewed and stable  Post vital signs: Reviewed and stable  Last Vitals:  Vitals Value Taken Time  BP 110/42 08/20/24 08:30  Temp 36.9 C 08/20/24 08:29  Pulse 82 08/20/24 08:32  Resp 20 08/20/24 08:32  SpO2 95 % 08/20/24 08:32  Vitals shown include unfiled device data.  Last Pain:  Vitals:   08/20/24 0829  TempSrc:   PainSc: 0-No pain      Patients Stated Pain Goal: 0 (08/20/24 0718)  Complications: No notable events documented.

## 2024-08-20 NOTE — Discharge Summary (Addendum)
 Physician Discharge Summary   Patient: Joanna White MRN: [id number] DOB: Oct 20, 1941  Admit date:     08/18/2024  Discharge date: 08/20/24  Discharge Physician: Owen DELENA Lore   PCP: Rilla Baller, MD   Recommendations at discharge:   Follow up resolution of colitis.  Needs CBC to follow hb and WBC>   Discharge Diagnoses: Principal Problem:   Lower GI bleed Active Problems:   Colitis   History of colonic polyps  Resolved Problems:   * No resolved hospital problems. 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  Patient went [employer] to [place] her husband's town and was swarmed by ants on her right foot.  Subsequently went to a nearby [employer] where she had an episode of loss of consciousness.  She felt dizzy and lightheaded and a sudden urge of defecation.  EMS was activated she received intramuscular epi.  She was placed on epinephrine  drip via route by EMS due to hypotension.  Right foot was swollen by ants bites.   In the ED the foot swelling completely resolved.  She had multiple watery stool in the ED.  Color went from brown to bloody.  Pressure in the ED was in the 120 range.  Hemoglobin 14.  Admitted for further evaluation.  Assessment and Plan: 1-Acute lower GI bleed: - Patient developed bloody stool on admission.  She reports mild abdominal pain like a burning sensation. - Continue to monitor hemoglobin so far at 12. - IV Protonix  -underwent colonoscopy which showed: Inflammation characterized by congestion, erosions and friability granularity loss of vascularity and shallow ulceration were found continuous and circumferential pattern from the rectum to the splenic flexure. The  transverse colon and hepatic flexure ascending colon and  cecum were spared. GI considering ischemic colitis versus chronic colitis.  Biopsies were taken.  Patient will also be discharged on Augmentin  for 3 days to decrease risk for bacterial translocation.  -Patient wishes to discharge today, she will be discharged later today if she is able to tolerate diet.  Syncope:  - CT angio chest:No evidence of pulmonary embolism. Subtle geographic patchy hazy attenuation of the lungs which can be seen in small airways disease. - To be related to GI bleed, suspect hypotension. - Monitor on telemetry -Treated with  IV fluids   Hypernatremia Metabolic acidosis -In the setting of hypovolemia and diarrhea - Treated  with IV fluids   Hypocalcemia - Received calcium  gluconate.  Monitor   Erythrocytosis JAK2 negative - Follows with Dr. Timmy. -Monitor hb.    History of TIA on aspirin  - Holding aspirin  in the setting of GI bleed   Hypothyroidism - Continue Synthroid    Recent treated dental infection - Completed treatment on 9/11   Hypokalemia: - Replaced   Mildly elevated troponin Mild    Leukocytosis suspect worsening white count in the setting of IV steroids Follow trends. Trending down.   Diarrhea: no diarrhea this am/    Allergic reaction: Bite multiple ants, she had a swelling of her feet.  She received IV steroid and received epi by EMS in  route.  Right foot looks better.  No significant edema   Estimated body mass index is 28.8 kg/m as calculated from the following:   Height as of 07/03/24: 4' 11.5 (1.511 m).   Weight as of 07/03/24: 65.8  kg.         Consultants: GI Procedures performed: Colonoscopy  Disposition: Home Diet recommendation:  Discharge Diet Orders (From admission, onward)     Start     Ordered   08/20/24 0000  Diet - low sodium heart healthy        08/20/24 1227           Cardiac diet DISCHARGE MEDICATION: Allergies as of 08/20/2024       Reactions   Flonase  [fluticasone Propionate] Anaphylaxis    Fluticasone Propionate Other (See Comments)   REACTION: throat tight        Medication List     STOP taking these medications    amoxicillin  500 MG capsule Commonly known as: AMOXIL    aspirin  EC 81 MG tablet   multivitamin tablet   OIL OF OREGANO PO   PUMPKIN SEED OIL PO   rosuvastatin  10 MG tablet Commonly known as: CRESTOR        TAKE these medications    amoxicillin -clavulanate 875-125 MG tablet Commonly known as: AUGMENTIN  Take 1 tablet by mouth every 12 (twelve) hours for 3 days.   dorzolamide-timolol 2-0.5 % ophthalmic solution Commonly known as: COSOPT Place 1 drop into both eyes 2 (two) times daily.   EPINEPHrine  0.3 mg/0.3 mL Soaj injection Commonly known as: EPI-PEN Inject 0.3 mg into the muscle as needed for anaphylaxis.   latanoprost  0.005 % ophthalmic solution Commonly known as: XALATAN  Place into both eyes at bedtime.   levothyroxine  75 MCG tablet Commonly known as: SYNTHROID  Take 1 tablet (75 mcg total) by mouth daily before breakfast.   pantoprazole  40 MG tablet Commonly known as: Protonix  Take 1 tablet (40 mg total) by mouth daily.   Vitamin D3 25 MCG (1000 UT) Caps Take 1 capsule (1,000 Units total) by mouth daily.        Follow-up Information     Rilla Baller, MD Follow up in 1 week(s).   Specialty: Family Medicine Contact information: [address] [place] [address] [phone]                Discharge Exam: [name]   08/20/24 0718  Weight: 64.4 kg   General; NAD  Condition at discharge: stable  The results of significant diagnostics from this hospitalization (including imaging, microbiology, ancillary and laboratory) are listed below for reference.   Imaging Studies: CT Angio Chest PE W and/or Wo Contrast Result Date: 08/18/2024 CLINICAL DATA:  Positive D-dimer. Dizziness and hypotension. Syncopal episode. Possible pulmonary embolism. EXAM: CT ANGIOGRAPHY CHEST WITH CONTRAST  TECHNIQUE: Multidetector CT imaging of the chest was performed using the standard protocol during bolus administration of intravenous contrast. Multiplanar CT image reconstructions and MIPs were obtained to evaluate the vascular anatomy. RADIATION DOSE REDUCTION: This exam was performed according to the departmental dose-optimization program which includes automated exposure control, adjustment of the mA and/or kV according to patient size and/or use of iterative reconstruction technique. CONTRAST:  75mL OMNIPAQUE  IOHEXOL  350 MG/ML SOLN COMPARISON:  None Available. FINDINGS: Cardiovascular: Heart is normal size. Calcified plaque over the 3 vessel coronary arteries. Thoracic aorta is normal in caliber. Mild calcified plaque over the descending thoracic aorta. Pulmonary arterial system is well opacified without evidence of emboli. Mediastinum/Nodes: No mediastinal or hilar adenopathy. Remaining mediastinal structures are unremarkable. Lungs/Pleura: Lungs are adequately inflated without lobar consolidation or effusion. Subtle geographic patchy hazy attenuation of the lungs which can be seen in small airways disease. Mild overall narrowing of the central airways.  Upper Abdomen: Calcified plaque over the abdominal aorta. No acute findings. Musculoskeletal: No focal abnormality. Review of the MIP images confirms the above findings. IMPRESSION: 1. No evidence of pulmonary embolism. 2. Subtle geographic patchy hazy attenuation of the lungs which can be seen in small airways disease. Mild overall narrowing of the central airways. 3. Aortic atherosclerosis. Atherosclerotic coronary artery disease. Aortic Atherosclerosis (ICD10-I70.0). Electronically Signed   By: Toribio Agreste M.D.   On: 08/18/2024 20:01   CT ANGIO GI BLEED Result Date: 08/18/2024 CLINICAL DATA:  Bright red blood per rectum EXAM: CTA ABDOMEN AND PELVIS WITHOUT AND WITH CONTRAST TECHNIQUE: Multidetector CT imaging of the abdomen and pelvis was performed  using the standard protocol during bolus administration of intravenous contrast. Multiplanar reconstructed images and MIPs were obtained and reviewed to evaluate the vascular anatomy. RADIATION DOSE REDUCTION: This exam was performed according to the departmental dose-optimization program which includes automated exposure control, adjustment of the mA and/or kV according to patient size and/or use of iterative reconstruction technique. CONTRAST:  OMNIPAQUE  IOHEXOL  350 MG/ML SOLN COMPARISON:  None available FINDINGS: VASCULAR Aorta: Normal caliber aorta without aneurysm, dissection, vasculitis or significant stenosis. Aortic atherosclerosis. Celiac: Patent SMA: Patent Renals: Patent IMA: Patent Inflow: Patent without evidence of aneurysm, dissection, vasculitis or significant stenosis. Atherosclerotic calcifications. Proximal Outflow: Bilateral common femoral and visualized portions of the superficial and profunda femoral arteries are patent without evidence of aneurysm, dissection, vasculitis or significant stenosis. Veins: No obvious venous abnormality within the limitations of this arterial phase study. Review of the MIP images confirms the above findings. NON-VASCULAR Lower chest: No acute findings. Hepatobiliary: No focal hepatic abnormality. Gallbladder unremarkable. Pancreas: No focal abnormality or ductal dilatation. Spleen: No focal abnormality.  Normal size. Adrenals/Urinary Tract: No adrenal abnormality. No focal renal abnormality. No stones or hydronephrosis. Urinary bladder is unremarkable. Stomach/Bowel: No active contrast extravasation to localize GI bleed. Stomach, large and small bowel grossly unremarkable. No obstruction or inflammatory process. Lymphatic: No adenopathy Reproductive: Uterus and adnexa unremarkable.  No mass. Other: Trace free fluid in the cul-de-sac.  No free air. Musculoskeletal: No acute bony abnormality. IMPRESSION: VASCULAR Aortoiliac atherosclerosis.  No aneurysm or  dissection. No contrast extravasation within the bowel to localize GI bleed. NON-VASCULAR Trace free fluid in the pelvis. Otherwise no acute findings in the abdomen or pelvis. Electronically Signed   By: Franky Crease M.D.   On: 08/18/2024 17:25   DG Chest Port 1 View Result Date: 08/18/2024 CLINICAL DATA:  Shortness of breath. EXAM: PORTABLE CHEST 1 VIEW COMPARISON:  06/20/2012 FINDINGS: Lungs are adequately inflated without focal airspace consolidation, effusion or pneumothorax. Cardiomediastinal silhouette and remainder of the exam is unchanged. IMPRESSION: No active disease. Electronically Signed   By: Toribio Agreste M.D.   On: 08/18/2024 14:18    Microbiology: No results found for this or any previous visit.  Labs: CBC: Recent Labs  Lab 08/18/24 1505 08/18/24 1932 08/19/24 0324 08/19/24 0732 08/19/24 0916 08/20/24 0547  WBC 21.2*  --   --   --  27.7* 21.8*  NEUTROABS 18.5*  --   --   --   --   --   HGB 14.4 14.6 12.6 12.3 12.5 11.6*  HCT 47.3* 45.5 39.0 38.7 39.9 37.6  MCV 91.0  --   --   --  89.3 89.7  PLT 217  --   --   --  215 195   Basic Metabolic Panel: Recent Labs  Lab 08/18/24 1400 08/18/24 1605 08/19/24 0732 08/20/24  0547  NA 146*  --  141 141  K 3.1*  --  5.0 4.0  CL 117*  --  113* 107  CO2 15*  --  18* 20*  GLUCOSE 115*  --  120* 105*  BUN 14  --  15 12  CREATININE 0.67  --  0.81 0.75  CALCIUM  6.7*  --  8.6* 8.9  MG  --  1.9  --   --    Liver Function Tests: Recent Labs  Lab 08/18/24 1605  AST 28  ALT 13  ALKPHOS 52  BILITOT 0.7  PROT 6.3*  ALBUMIN 3.8   CBG: No results for input(s): GLUCAP in the last 168 hours.  Discharge time spent: greater than 30 minutes.  Signed: Owen DELENA Lore, MD [facility] 08/20/2024

## 2024-08-20 NOTE — Anesthesia Postprocedure Evaluation (Signed)
 Anesthesia Post Note  Patient: Joanna White  Procedure(s) Performed: COLONOSCOPY     Patient location during evaluation: Endoscopy Anesthesia Type: MAC Level of consciousness: awake and alert Pain management: pain level controlled Vital Signs Assessment: post-procedure vital signs reviewed and stable Respiratory status: spontaneous breathing, nonlabored ventilation and respiratory function stable Cardiovascular status: stable and blood pressure returned to baseline Postop Assessment: no apparent nausea or vomiting Anesthetic complications: no   No notable events documented.                 Tyrese Capriotti

## 2024-08-21 ENCOUNTER — Encounter (HOSPITAL_COMMUNITY): Payer: Self-pay | Admitting: Gastroenterology

## 2024-08-22 ENCOUNTER — Ambulatory Visit: Payer: Self-pay | Admitting: Gastroenterology

## 2024-08-22 LAB — SURGICAL PATHOLOGY

## 2024-08-23 NOTE — Telephone Encounter (Unsigned)
 Copied from CRM #[id number]. Topic: Appointments - Scheduling Inquiry for Clinic >> Aug 23, 2024 12:03 PM Jasmin G wrote: Reason for CRM: Pt scheduled a Hospital follow up appt on Oct 1st due to having an allergic reaction to ants and was told that she should come in for lab work to check hemoglobin levels. Please call pt back at [phone] to schedule.

## 2024-08-28 ENCOUNTER — Encounter: Payer: Self-pay | Admitting: Family Medicine

## 2024-08-29 DIAGNOSIS — M545 Low back pain, unspecified: Secondary | ICD-10-CM | POA: Diagnosis not present

## 2024-08-30 ENCOUNTER — Encounter: Payer: Self-pay | Admitting: Family Medicine

## 2024-08-30 ENCOUNTER — Ambulatory Visit: Admitting: Family Medicine

## 2024-08-30 VITALS — BP 138/72 | HR 74 | Temp 97.9°F | Ht 60.0 in | Wt 139.5 lb

## 2024-08-30 DIAGNOSIS — Z23 Encounter for immunization: Secondary | ICD-10-CM

## 2024-08-30 DIAGNOSIS — T63481A Toxic effect of venom of other arthropod, accidental (unintentional), initial encounter: Secondary | ICD-10-CM

## 2024-08-30 DIAGNOSIS — E785 Hyperlipidemia, unspecified: Secondary | ICD-10-CM | POA: Diagnosis not present

## 2024-08-30 DIAGNOSIS — T782XXA Anaphylactic shock, unspecified, initial encounter: Secondary | ICD-10-CM

## 2024-08-30 DIAGNOSIS — M545 Low back pain, unspecified: Secondary | ICD-10-CM

## 2024-08-30 DIAGNOSIS — K559 Vascular disorder of intestine, unspecified: Secondary | ICD-10-CM | POA: Diagnosis not present

## 2024-08-30 DIAGNOSIS — I69354 Hemiplegia and hemiparesis following cerebral infarction affecting left non-dominant side: Secondary | ICD-10-CM

## 2024-08-30 NOTE — Progress Notes (Signed)
 Ph: [phone] Fax: [phone]   Patient ID: Joanna White, female    DOB: Apr 16, 1941, 83 y.o.   MRN: [id number]  This visit was conducted in person.  BP 138/72   Pulse 74   Temp 97.9 F (36.6 C) (Oral)   Ht 5' (1.524 m)   Wt 139 lb 8 oz (63.3 kg)   SpO2 94%   BMI 27.24 kg/m    CC: hosp f/u visit  Subjective:   HPI: Joanna White is a 83 y.o. female presenting on 08/30/2024 for Hospitalization Follow-up (Admitted on 08/18/24 at [facility], dx lower GI bleed; anaphylaxis; hypokalemia; syncope. Pt accompanied by son, Redell. 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 Subsequently developed bloody diarrhea.  Hospital records reviewed. Med rec performed.  Initially treated with intramuscular epinephrine  followed by epi drip.  Treated with IV pantoprazole , discharged on oral augmentin  3d course.   Colonoscopy showed hemorrhoids, and severe inflammation from rectum to splenic flexure.  Colonoscopy pathology (left colon and rectum) showed acute colitis with necrosis most consistent with ischemic colitis.   Since home, having normal BMs. No fevers/chills. No blood in stool. No dizziness, chest pain, dyspnea, headache, palpitations.   She never had tongue/lip swelling throat swelling or shortness of breath.  She notes yellow jacket stings cause localized swelling.  She had another reaction with hand swelling and color change after another insect sting years ago.   Saw [facility] yesterday - had back and hip xrays fortunately no fracture - trauma from hitting back after syncopal event. Treated with prednisone - currently on this.   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   Home health not set up.  Other follow up appointments scheduled: none ______________________________________________________________________ Hospital  admission: 08/18/2024 Hospital discharge: 08/20/2024 TCM f/u phone call:  not performed   Recommendations at discharge:  Follow up resolution of colitis.  Needs CBC to follow hb and WBC   Discharge Diagnoses: Principal Problem:   Lower GI bleed Active Problems:   Colitis   History of colonic polyps      Relevant past medical, surgical, family and social history reviewed and updated as indicated. Interim medical history since our last visit reviewed. Allergies and medications reviewed and updated. Outpatient Medications Prior to Visit  Medication Sig Dispense Refill   Cholecalciferol (VITAMIN D3) 1000 units CAPS Take 1 capsule (1,000 Units total) by mouth daily. 30 capsule    dorzolamide-timolol (COSOPT) 2-0.5 % ophthalmic solution Place 1 drop into both eyes 2 (two) times daily.     EPINEPHrine  0.3 mg/0.3 mL IJ SOAJ injection Inject 0.3 mg into the muscle as needed for anaphylaxis. 1 each 0   latanoprost  (XALATAN ) 0.005 % ophthalmic solution Place into both eyes at bedtime.      levothyroxine  (SYNTHROID ) 75 MCG tablet Take 1 tablet (75 mcg total) by mouth daily before breakfast. 90 tablet 4   methylPREDNISolone  (MEDROL  DOSEPAK) 4 MG TBPK tablet SMARTSIG:- Tablet(s) By Mouth -     pantoprazole  (PROTONIX ) 40 MG tablet Take 1 tablet (40 mg total) by mouth daily. 30 tablet 1   No facility-administered medications prior to visit.     Per HPI unless specifically indicated in ROS section below Review of Systems  Objective:  BP 138/72   Pulse 74   Temp 97.9 F (36.6 C) (Oral)   Ht 5' (1.524 m)  Wt 139 lb 8 oz (63.3 kg)   SpO2 94%   BMI 27.24 kg/m   Wt Readings from Last 3 Encounters:  08/30/24 139 lb 8 oz (63.3 kg)  08/20/24 142 lb (64.4 kg)  07/03/24 145 lb 0.6 oz (65.8 kg)      Physical Exam Vitals and nursing note reviewed.  Constitutional:      Appearance: Normal appearance. She is not ill-appearing.  HENT:     Head: Normocephalic and atraumatic.     Mouth/Throat:      Mouth: Mucous membranes are moist.     Pharynx: Oropharynx is clear. No oropharyngeal exudate or posterior oropharyngeal erythema.  Eyes:     Extraocular Movements: Extraocular movements intact.     Conjunctiva/sclera: Conjunctivae normal.     Pupils: Pupils are equal, round, and reactive to light.  Neck:     Thyroid : No thyroid  mass or thyromegaly.  Cardiovascular:     Rate and Rhythm: Normal rate and regular rhythm.     Pulses: Normal pulses.     Heart sounds: Normal heart sounds. No murmur heard. Pulmonary:     Effort: Pulmonary effort is normal. No respiratory distress.     Breath sounds: Normal breath sounds. No wheezing, rhonchi or rales.  Abdominal:     General: Bowel sounds are normal. There is no distension.     Palpations: Abdomen is soft. There is no mass.     Tenderness: There is no abdominal tenderness. There is no guarding or rebound.     Hernia: No hernia is present.  Musculoskeletal:     Cervical back: Normal range of motion and neck supple.     Right lower leg: No edema.     Left lower leg: No edema.  Skin:    General: Skin is warm and dry.     Findings: No rash.     Comments: R leg distal to ankle into dorsal foot with innumerable healing ant bites with resolving erythematous papules, sole spared, L foot spared  Neurological:     Mental Status: She is alert.  Psychiatric:        Mood and Affect: Mood normal.        Behavior: Behavior normal.       Lab Results  Component Value Date   WBC 18.1 Repeated and verified X2. (HH) 08/30/2024   HGB 13.4 08/30/2024   HCT 41.3 08/30/2024   MCV 87.4 08/30/2024   PLT 341.0 08/30/2024    Lab Results  Component Value Date   NA 141 08/20/2024   CL 107 08/20/2024   K 4.0 08/20/2024   CO2 20 (L) 08/20/2024   BUN 12 08/20/2024   CREATININE 0.75 08/20/2024   GFRNONAA >60 08/20/2024   CALCIUM  8.9 08/20/2024   ALBUMIN 3.8 08/18/2024   GLUCOSE 105 (H) 08/20/2024    Lab Results  Component Value Date   ALT 13  08/18/2024   AST 28 08/18/2024   ALKPHOS 52 08/18/2024   BILITOT 0.7 08/18/2024     Assessment & Plan:   Problem List Items Addressed This Visit     Dyslipidemia   Ok to restart rosuvastatin . Recommend restart aspirin  81mg  daily in 1 wk.       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  This is chronic.       Ischemic colitis   Anticipate initial hypotension from allergic reaction  followed by further vasoconstriction of epinephrine  use contributed to episode of ischemic colitis confirmed by colonoscopy/biopsy.  Now since home, symptoms are better.  Reviewed importance of good hydration status and avoiding hypotension in h/o ischemic colitis.  Will continue to monitor.       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 She now has Epi pen to use.  Offered allergist eval - pt declines.   Notes large local reaction to yellow jacket stings in the past - there is cross-reactivity between wasps and fire ants.       Back pain   Notes low back pain and bilat hip pain thought from trauma of syncope at grocery store.  She saw ortho with reassuring evaluation negative for fractures, she is currently on prednisone taper through ortho for this.       Relevant Medications   methylPREDNISolone  (MEDROL  DOSEPAK) 4 MG TBPK tablet   Other Visit Diagnoses       Need for influenza vaccination       Relevant Orders   Flu vaccine HIGH DOSE PF(Fluzone Trivalent) (Completed)        No orders of the defined types were placed in this encounter.   Orders Placed This Encounter  Procedures   Flu vaccine HIGH DOSE PF(Fluzone Trivalent)   CBC with Differential/Platelet    Patient  Instructions  Flu shot today Restart rosuvastatin  10mg  MWF Restart aspirin  over the next few weeks.  Keep epi pen on hand.  Good to see you today.  Stay well hydrated, avoid drops in blood pressures.   Follow up plan: No follow-ups on file.  Anton Blas, MD

## 2024-08-30 NOTE — Patient Instructions (Addendum)
 Flu shot today Restart rosuvastatin  10mg  MWF Restart aspirin  over the next few weeks.  Keep epi pen on hand.  Good to see you today.  Stay well hydrated, avoid drops in blood pressures.

## 2024-08-31 ENCOUNTER — Telehealth: Payer: Self-pay

## 2024-08-31 DIAGNOSIS — K529 Noninfective gastroenteritis and colitis, unspecified: Secondary | ICD-10-CM

## 2024-08-31 LAB — CBC WITH DIFFERENTIAL/PLATELET
Basophils Absolute: 0.2 K/uL — ABNORMAL HIGH (ref 0.0–0.1)
Basophils Relative: 1.2 % (ref 0.0–3.0)
Eosinophils Absolute: 0 K/uL (ref 0.0–0.7)
Eosinophils Relative: 0.1 % (ref 0.0–5.0)
HCT: 41.3 % (ref 36.0–46.0)
Hemoglobin: 13.4 g/dL (ref 12.0–15.0)
Lymphocytes Relative: 12.2 % (ref 12.0–46.0)
Lymphs Abs: 2.2 K/uL (ref 0.7–4.0)
MCHC: 32.4 g/dL (ref 30.0–36.0)
MCV: 87.4 fl (ref 78.0–100.0)
Monocytes Absolute: 0.9 K/uL (ref 0.1–1.0)
Monocytes Relative: 4.8 % (ref 3.0–12.0)
Neutro Abs: 14.8 K/uL — ABNORMAL HIGH (ref 1.4–7.7)
Neutrophils Relative %: 81.7 % — ABNORMAL HIGH (ref 43.0–77.0)
Platelets: 341 K/uL (ref 150.0–400.0)
RBC: 4.73 Mil/uL (ref 3.87–5.11)
RDW: 15.5 % (ref 11.5–15.5)
WBC: 18.1 K/uL (ref 4.0–10.5)

## 2024-08-31 NOTE — Telephone Encounter (Signed)
 CRITICAL VALUE STICKER  CRITICAL VALUE: WBC 18.1  RECEIVER (on-site recipient of call): Elysabeth Aust  DATE & TIME NOTIFIED: 08/31/24 11:55  MESSENGER (representative from lab): Darice Finn lab  MD NOTIFIED: [place]  TIME OF NOTIFICATION: 11:55  WBC 18.1

## 2024-08-31 NOTE — Telephone Encounter (Addendum)
 Noted.  Plz notify pt - white blood cells were elevated as expected, likely from prednisone she's currently on from ortho, however they are lower than when she was in the hospital. She is no longer anemic.  As she's feeling well, we will continue to monitor this. Recommend repeat labwork in 3-4 wks after completing prednisone taper. Please schedule lab visit for this.

## 2024-08-31 NOTE — Telephone Encounter (Signed)
 Improved from 21.8 in setting of ischemic colitis Will forward to pcp  And to myself to monitor

## 2024-09-01 ENCOUNTER — Ambulatory Visit: Payer: Self-pay | Admitting: Family Medicine

## 2024-09-01 NOTE — Telephone Encounter (Signed)
 Spoke with Joanna White relaying Dr Talmadge message. Joanna White verbalizes understanding and scheduled rpt lab visit on 09/26/24 at 10:00.

## 2024-09-01 NOTE — Addendum Note (Signed)
 Addended by: RILLA BALLER on: 09/01/2024 12:00 AM   Modules accepted: Orders

## 2024-09-04 DIAGNOSIS — M549 Dorsalgia, unspecified: Secondary | ICD-10-CM | POA: Insufficient documentation

## 2024-09-04 DIAGNOSIS — T782XXA Anaphylactic shock, unspecified, initial encounter: Secondary | ICD-10-CM | POA: Insufficient documentation

## 2024-09-04 NOTE — Assessment & Plan Note (Signed)
 Ok to restart rosuvastatin . Recommend restart aspirin  81mg  daily in 1 wk.

## 2024-09-04 NOTE — Assessment & Plan Note (Signed)
 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  This is chronic.

## 2024-09-04 NOTE — Assessment & Plan Note (Signed)
 Anticipate initial hypotension from allergic reaction followed by further vasoconstriction of epinephrine  use contributed to episode of ischemic colitis confirmed by colonoscopy/biopsy.  Now since home, symptoms are better.  Reviewed importance of good hydration status and avoiding hypotension in h/o ischemic colitis.  Will continue to monitor.

## 2024-09-04 NOTE — Assessment & Plan Note (Addendum)
 Notes low back pain and bilat hip pain thought from trauma of syncope at grocery store.  She saw ortho with reassuring evaluation negative for fractures, she is currently on prednisone taper through ortho for this.

## 2024-09-04 NOTE — Assessment & Plan Note (Addendum)
 Concern for this as cause of initial symptoms - initially presented with marked hypotension, along with localized swelling of foot. She now has Epi pen to use.  Offered allergist eval - pt declines.   Notes large local reaction to yellow jacket stings in the past - there is cross-reactivity between wasps and fire ants.

## 2024-09-06 ENCOUNTER — Encounter: Payer: Self-pay | Admitting: Physician Assistant

## 2024-09-26 ENCOUNTER — Other Ambulatory Visit (INDEPENDENT_AMBULATORY_CARE_PROVIDER_SITE_OTHER)

## 2024-09-26 DIAGNOSIS — K529 Noninfective gastroenteritis and colitis, unspecified: Secondary | ICD-10-CM | POA: Diagnosis not present

## 2024-09-26 LAB — CBC WITH DIFFERENTIAL/PLATELET
Basophils Absolute: 0.1 K/uL (ref 0.0–0.1)
Basophils Relative: 0.8 % (ref 0.0–3.0)
Eosinophils Absolute: 0.4 K/uL (ref 0.0–0.7)
Eosinophils Relative: 4.8 % (ref 0.0–5.0)
HCT: 42.4 % (ref 36.0–46.0)
Hemoglobin: 13.7 g/dL (ref 12.0–15.0)
Lymphocytes Relative: 24.6 % (ref 12.0–46.0)
Lymphs Abs: 1.9 K/uL (ref 0.7–4.0)
MCHC: 32.3 g/dL (ref 30.0–36.0)
MCV: 88.1 fl (ref 78.0–100.0)
Monocytes Absolute: 1.3 K/uL — ABNORMAL HIGH (ref 0.1–1.0)
Monocytes Relative: 16.6 % — ABNORMAL HIGH (ref 3.0–12.0)
Neutro Abs: 4.1 K/uL (ref 1.4–7.7)
Neutrophils Relative %: 53.2 % (ref 43.0–77.0)
Platelets: 242 K/uL (ref 150.0–400.0)
RBC: 4.82 Mil/uL (ref 3.87–5.11)
RDW: 16 % — ABNORMAL HIGH (ref 11.5–15.5)
WBC: 7.8 K/uL (ref 4.0–10.5)

## 2024-09-28 ENCOUNTER — Ambulatory Visit: Admitting: Allergy & Immunology

## 2024-09-28 VITALS — BP 130/72 | HR 64 | Temp 97.7°F | Ht 60.0 in | Wt 141.0 lb

## 2024-09-28 DIAGNOSIS — D751 Secondary polycythemia: Secondary | ICD-10-CM | POA: Diagnosis not present

## 2024-09-28 DIAGNOSIS — T63421D Toxic effect of venom of ants, accidental (unintentional), subsequent encounter: Secondary | ICD-10-CM

## 2024-09-28 DIAGNOSIS — K559 Vascular disorder of intestine, unspecified: Secondary | ICD-10-CM | POA: Diagnosis not present

## 2024-09-28 DIAGNOSIS — T63421A Toxic effect of venom of ants, accidental (unintentional), initial encounter: Secondary | ICD-10-CM

## 2024-09-28 NOTE — Progress Notes (Unsigned)
 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 Fire ant bite - We are going to get some testing to stinging insects as well as fire ants. - EpiPen  training reviewed. - Emergency Action Plan provided. - There is a possibility that this is related toa venom toxicity reaction rather than a true allergy (ie you got SO MANY stings that the venom would have caused a reaction no matter what).  2. Return in about 6 months (around 03/29/2025). You can have the follow up appointment with Dr. Iva or a Nurse Practicioner (our Nurse Practitioners are excellent and always have Physician oversight!).    Please inform us  of any Emergency Department visits, hospitalizations, or changes in symptoms. Call us  before going to the ED for breathing or allergy symptoms since we might be able to fit you in for a sick visit. Feel free to contact us  anytime with any questions, problems, or concerns.  It was a pleasure to meet you and your family today!  Websites that have reliable patient information: 1. American Academy of Asthma, Allergy, and Immunology: www.aaaai.org 2. Food Allergy Research and Education (FARE): foodallergy.org 3. Mothers of Asthmatics: http://www.asthmacommunitynetwork.org 4. American College of Allergy, Asthma, and Immunology: www.acaai.org      "Like" us  on Facebook and Instagram for our latest updates!      A healthy democracy works best when [employer] participate! Make sure you are registered to vote! If you have moved or changed any of your contact information, you will need to get this updated before voting! Scan the QR codes below to learn more!              {Blank single:19197::This note in its entirety was forwarded to the Provider who requested this consultation.}  Subjective:   Joanna White is a 83 y.o. female presenting today for evaluation of  Chief Complaint  Patient presents with  . Establish Care    Pt reports to have had an anaphylaxic episode after being attacked by tiny black ants and was recommended for further testing.    Tacha Manni has a history of the following: Patient Active Problem List   Diagnosis Date Noted  . 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 Back pain 09/04/2024  . History of colonic polyps 08/20/2024  . Ischemic colitis 08/18/2024  . Polycythemia 02/23/2023  . 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 Numbness on left side 09/15/2022  . White coat syndrome without diagnosis of hypertension 02/16/2022  . Health maintenance examination 01/14/2021  . Medicare annual wellness visit, subsequent 08/04/2019  . Clavicle enlargement 08/04/2019  . Dyslipidemia 03/01/2018  . Advanced care planning/counseling discussion 04/01/2017  . Wrist swelling, right 04/01/2017  . Primary open angle glaucoma of both eyes, moderate stage 02/01/2017  . Cerumen impaction 07/01/2015  . Meralgia paresthetica of left side 04/01/2015  . Osteoarthritis of knee 08/25/2011  . Osteopenia 06/28/2008  . Hypothyroidism 10/19/2007  . History of left breast cancer 10/19/2007    History obtained from: chart review and {Persons; PED relatives w/patient:19415::patient}.  Discussed the use of AI scribe software for clinical note transcription with the patient and/or guardian, who gave verbal consent to proceed.  Joanna White was referred by Rilla Baller, MD.     Joanna White is a 83 y.o. female presenting for {Blank single:19197::a food challenge,a drug challenge,skin testing,a sick visit,an evaluation of ***,a follow up visit}.     Asthma/Respiratory Symptom History: ***  Allergic Rhinitis Symptom History: ***  Food Allergy Symptom History: ***  Skin Symptom History: ***  GERD Symptom History: ***  Infection Symptom History: ***  ***Otherwise, there is no history of other atopic diseases, including {Blank multiple:19196:o:asthma,food allergies,drug allergies,environmental allergies,stinging insect allergies,eczema,urticaria,contact dermatitis}. There is no significant infectious history. ***Vaccinations are up to date.    Past Medical History: Patient Active Problem List   Diagnosis Date Noted  . 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 Back pain 09/04/2024  . History of colonic polyps 08/20/2024  . Ischemic colitis 08/18/2024  . Polycythemia 02/23/2023  . 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 Numbness on left side 09/15/2022  . White coat syndrome without diagnosis of hypertension 02/16/2022  . Health maintenance examination 01/14/2021  . Medicare annual wellness visit, subsequent 08/04/2019  . Clavicle enlargement 08/04/2019  . Dyslipidemia 03/01/2018  . Advanced care planning/counseling discussion 04/01/2017  . Wrist swelling, right 04/01/2017  . Primary open angle glaucoma of both eyes, moderate stage 02/01/2017  . Cerumen impaction 07/01/2015  . Meralgia paresthetica of left side 04/01/2015  . Osteoarthritis of knee 08/25/2011  . Osteopenia 06/28/2008  . Hypothyroidism 10/19/2007  . History of left breast cancer 10/19/2007    Medication List:  Allergies as of 09/28/2024       Reactions   Fire Ant Anaphylaxis   Flonase  [fluticasone Propionate] Anaphylaxis   Fluticasone Propionate Other (See Comments)   REACTION: throat tight        Medication List        Accurate as of September 28, 2024  4:38 PM. If you have any questions, ask your nurse or doctor.          dorzolamide-timolol 2-0.5 % ophthalmic solution Commonly  known as: COSOPT Place 1 drop into both eyes 2 (two) times daily.   EPINEPHrine  0.3 mg/0.3 mL Soaj injection Commonly known as: EPI-PEN Inject 0.3 mg into the muscle as needed for anaphylaxis.   latanoprost  0.005 % ophthalmic solution Commonly known as: XALATAN  Place into both eyes at bedtime.   levothyroxine  75 MCG tablet Commonly known as: SYNTHROID  Take 1 tablet (75 mcg total) by mouth daily before breakfast.   methylPREDNISolone  4 MG Tbpk tablet Commonly known as: MEDROL  DOSEPAK SMARTSIG:- Tablet(s) By Mouth -   pantoprazole  40 MG tablet Commonly known as: Protonix  Take 1 tablet (40 mg total) by mouth daily.   Vitamin D3 25 MCG (1000 UT) Caps Take 1 capsule (1,000 Units total) by mouth daily.        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 She has required no {Blank multiple:19196:a:speech therapy,occupational therapy,physical therapy}. ***non-contributory  Past Surgical History: Past Surgical History:  Procedure Laterality Date  . BREAST LUMPECTOMY  1998   Left  . CESAREAN SECTION     x 2  . COLONOSCOPY N/A 08/20/2024   hemorrhoids, severe colitis - pathology consistent with ischemic colitis (Mansouraty, Aloha Raddle., MD)  . DEXA  10/2015   osteopenia, T -1.1 spine, hip -1.7   . GYN surgery     uterine lining removed for heavy bleeding and polyp  .  lymph node removal  1998   left axilla  . Thumb surgery     bilateral  . TOTAL KNEE ARTHROPLASTY Left 2016   [name])     Family History: Family History  Problem Relation Age of Onset  . Diabetes Brother        x2  . Cancer Father 43       lung  . Cancer Mother 74       MM  . Coronary artery disease Brother 55       CABG  . Diabetes Brother   . Diabetes Maternal Grandmother   . Vasculitis Nephew        wegener's vasculitis  . Stroke  Neg Hx      Social History: Joanna White lives at home with ***. She lives in a house that is 84 years old.  There is hardwood throughout the home.  She has a oil heating and central cooling.  There are no animals inside or outside of the home.  There are no dust mite covers on the bedding.  There is no tobacco exposure.  She is currently retired.  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  Her husband died 39 years ago.   Review of systems otherwise negative other than that mentioned in the HPI.    Objective:   Blood pressure 130/72, pulse 64, temperature 97.7 F (36.5 C), height 5' (1.524 m), weight 141 lb (64 kg), SpO2 95%. Body mass index is 27.54 kg/m.     Physical Exam   Diagnostic studies: {Blank single:19197::none,deferred due to recent antihistamine use,deferred due to insurance stipulations that require a separate visit for testing,labs sent instead, }  Spirometry: {Blank single:19197::results normal (FEV1: ***%, FVC: ***%, FEV1/FVC: ***%),results abnormal (FEV1: ***%, FVC: ***%, FEV1/FVC: ***%)}.    {Blank single:19197::Spirometry consistent with mild obstructive disease,Spirometry consistent with moderate obstructive disease,Spirometry consistent with severe obstructive disease,Spirometry consistent with possible restrictive disease,Spirometry consistent with mixed obstructive and restrictive disease,Spirometry uninterpretable due to technique,Spirometry consistent with normal pattern}. {Blank single:19197::Albuterol /Atrovent nebulizer,Xopenex/Atrovent nebulizer,Albuterol  nebulizer,Albuterol  four puffs via MDI,Xopenex four puffs via MDI} treatment given in clinic with {Blank single:19197::significant improvement in FEV1 per ATS criteria,significant improvement in FVC per ATS criteria,significant improvement in FEV1 and FVC per ATS criteria,improvement in FEV1, but not significant per ATS  criteria,improvement in FVC, but not significant per ATS criteria,improvement in FEV1 and FVC, but not significant per ATS criteria,no improvement}.  Allergy Studies: {Blank single:19197::none,deferred due to recent antihistamine use,deferred due to insurance stipulations that require a separate visit for testing,labs sent instead, }    {Blank single:19197::Allergy testing results were read and interpreted by myself, documented by clinical staff., }         Marty Shaggy, MD [facility] 

## 2024-09-28 NOTE — Patient Instructions (Addendum)
 1. Fire ant bite - We are going to get some testing to stinging insects as well as fire ants. - EpiPen  training reviewed. - Emergency Action Plan provided. - There is a possibility that this is related toa venom toxicity reaction rather than a true allergy (ie you got SO MANY stings that the venom would have caused a reaction no matter what).  2. Return in about 6 months (around 03/29/2025). You can have the follow up appointment with Dr. Iva or a Nurse Practicioner (our Nurse Practitioners are excellent and always have Physician oversight!).    Please inform us  of any Emergency Department visits, hospitalizations, or changes in symptoms. Call us  before going to the ED for breathing or allergy symptoms since we might be able to fit you in for a sick visit. Feel free to contact us  anytime with any questions, problems, or concerns.  It was a pleasure to meet you and your family today!  Websites that have reliable patient information: 1. American Academy of Asthma, Allergy, and Immunology: www.aaaai.org 2. Food Allergy Research and Education (FARE): foodallergy.org 3. Mothers of Asthmatics: http://www.asthmacommunitynetwork.org 4. American College of Allergy, Asthma, and Immunology: www.acaai.org      "Like" us  on Facebook and Instagram for our latest updates!      A healthy democracy works best when [employer] participate! Make sure you are registered to vote! If you have moved or changed any of your contact information, you will need to get this updated before voting! Scan the QR codes below to learn more!

## 2024-09-29 ENCOUNTER — Encounter: Payer: Self-pay | Admitting: Allergy & Immunology

## 2024-10-01 LAB — HYMENOPTERA VENOM ALLERGY II

## 2024-10-03 ENCOUNTER — Ambulatory Visit: Payer: Self-pay | Admitting: Family Medicine

## 2024-10-03 LAB — HYMENOPTERA VENOM ALLERGY II

## 2024-10-03 NOTE — Telephone Encounter (Signed)
 Left message to return call to office. Ok to [employer] below.

## 2024-10-04 LAB — HYMENOPTERA VENOM ALLERGY II
Bumblebee: 0.17 kU/L — AB
Hornet, White Face, IgE: 10.3 kU/L — AB
Hornet, Yellow, IgE: 3.8 kU/L — AB
I001-IgE Honeybee: 0.99 kU/L — AB
I208-IgE Api m 1: <0.1 kU/L
I209-IgE Ves v 5: 16.3 kU/L — AB
I211-IgE Ves v 1: 15.5 kU/L — AB
I211-IgE Ves v 1: 26.9 kU/L — AB
I214-IgE Api m 2: <0.1 kU/L
I215-IgE Api m 3: <0.1 kU/L
I216-IgE Api m 5: 50.8 kU/L — AB
I217-IgE Api m 10: <0.1 kU/L
Reflex Information: 16.4 kU/L — AB
Reflex Information: 30.7 kU/L — AB
Tryptase: 4.7 ug/L (ref 2.2–13.2)

## 2024-10-04 LAB — ALLERGEN FIRE ANT: I070-IgE Fire Ant (Invicta): 2.48 kU/L — AB

## 2024-10-04 LAB — ALLERGEN COMPONENT COMMENTS

## 2024-10-10 ENCOUNTER — Ambulatory Visit: Payer: Self-pay | Admitting: Allergy & Immunology

## 2024-10-30 ENCOUNTER — Ambulatory Visit: Admitting: Physician Assistant

## 2024-10-30 ENCOUNTER — Encounter: Payer: Self-pay | Admitting: Physician Assistant

## 2024-10-30 VITALS — BP 130/82 | HR 60 | Ht 60.0 in | Wt 141.0 lb

## 2024-10-30 DIAGNOSIS — K559 Vascular disorder of intestine, unspecified: Secondary | ICD-10-CM

## 2024-10-30 NOTE — Patient Instructions (Signed)
 Follow up as needed

## 2024-10-30 NOTE — Progress Notes (Signed)
 Chief Complaint: Follow-up after colonoscopy  HPI:    Joanna White is an 83 year old female with a past medical history as listed below, known to Dr. Wilhelmenia, who presents to clinic today for follow-up after recent colonoscopy.    08/19/2024 patient seen in the hospital for GI bleeding.  CT angio negative.  At that time discussed possible ischemia proceed with colonoscopy as below.    08/20/2024 colonoscopy with hemorrhoids, one 2 mm polyp in the ascending colon, inflammation was found confluent in nature from the rectum to the splenic flexure and severe, question ischemic colitis versus chronic colitis.  Nonbleeding nonthrombosed external and internal hemorrhoids.  Discussed overall clinical scenario seem more ischemic.  Also given 3 days of Augmentin  875 twice daily.  Biopsies did show acute colitis with necrosis and surface exudate in the left colon and rectum consistent with ischemic colitis.    Ischemic colitis thought related to epinephrine  given for some fire  ant bites causing syncope.    Today, patient presents to clinic accompanied by her daughter.  They explained that she is doing well.  She has had no further GI issues at all.  She was given Pantoprazole  in the hospital and sent home with this, remained on this until this weekend when she ran out.  She has never had reflux symptoms in the past but got sent home from the hospital with this.  Wonders if she needs to continue.  Otherwise doing well with regular bowel movements no abdominal pain no heartburn or reflux.    Denies fever, chills, weight loss or continued blood in her stool.   Past Medical History:  Diagnosis Date   Glaucoma    Hypothyroid    Osteopenia 08/2011   h/o osteoporosis, s/p boniva for 5 yrs   Personal history of malignant neoplasm of breast 1998   Left    Past Surgical History:  Procedure Laterality Date   BREAST LUMPECTOMY  1998   Left   CESAREAN SECTION     x 2   COLONOSCOPY N/A 08/20/2024    hemorrhoids, severe colitis - pathology consistent with ischemic colitis (Mansouraty, Aloha Raddle., MD)   DEXA  10/2015   osteopenia, T -1.1 spine, hip -1.7    GYN surgery     uterine lining removed for heavy bleeding and polyp   lymph node removal  1998   left axilla   Thumb surgery     bilateral   TOTAL KNEE ARTHROPLASTY Left 2016   [name])    Current Outpatient Medications  Medication Sig Dispense Refill   Cholecalciferol (VITAMIN D3) 1000 units CAPS Take 1 capsule (1,000 Units total) by mouth daily. 30 capsule    dorzolamide-timolol (COSOPT) 2-0.5 % ophthalmic solution Place 1 drop into both eyes 2 (two) times daily.     EPINEPHrine  0.3 mg/0.3 mL IJ SOAJ injection Inject 0.3 mg into the muscle as needed for anaphylaxis. 1 each 0   latanoprost  (XALATAN ) 0.005 % ophthalmic solution Place into both eyes at bedtime.      levothyroxine  (SYNTHROID ) 75 MCG tablet Take 1 tablet (75 mcg total) by mouth daily before breakfast. 90 tablet 4   methylPREDNISolone  (MEDROL  DOSEPAK) 4 MG TBPK tablet SMARTSIG:- Tablet(s) By Mouth -     pantoprazole  (PROTONIX ) 40 MG tablet Take 1 tablet (40 mg total) by mouth daily. 30 tablet 1   No current facility-administered medications for this visit.    Allergies as of 10/30/2024 - Review Complete 09/29/2024  Allergen Reaction Noted   Fire  ant  Anaphylaxis 09/28/2024   Flonase  [fluticasone propionate] Anaphylaxis 09/08/2018   Fluticasone propionate Other (See Comments) 05/29/2008    Family History  Problem Relation Age of Onset   Diabetes Brother        x2   Cancer Father 36       lung   Cancer Mother 70       MM   Coronary artery disease Brother 87       CABG   Diabetes Brother    Diabetes Maternal Grandmother    Vasculitis Nephew        wegener's vasculitis   Stroke Neg Hx     Social History   Socioeconomic History   Marital status: Widowed    Spouse name: Not on file   Number of children: 2   Years of education: Not on file    Highest education level: Not on file  Occupational History   Occupation: Retired  Tobacco Use   Smoking status: Never   Smokeless tobacco: Never  Vaping Use   Vaping status: Never Used  Substance and Sexual Activity   Alcohol use: Never   Drug use: No   Sexual activity: Not Currently  Other Topics Concern   Not on file  Social History Narrative   Caffeine: 2cups coffee/dayWidow-husband died of MI on operating table2 children, grown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 Right   Are you currently employed ?    What is your current occupation? retired   Do you live at home alone?yes   Who lives with you?    What type of home do you live in: 1 story or 2 story?  Split level    Caffiene 2 coffee a day   Social Drivers of Corporate Investment Banker Strain: Low Risk  (02/10/2024)   Overall Financial Resource Strain (CARDIA)    Difficulty of Paying Living Expenses: Not hard at all  Food Insecurity: No Food Insecurity (08/19/2024)   Hunger Vital Sign    Worried About Running Out of Food in the Last Year: Never true    Ran Out of Food in the Last Year: Never true  Transportation Needs: No Transportation Needs (08/19/2024)   PRAPARE - Administrator, Civil Service (Medical): No    Lack of Transportation (Non-Medical): No  Physical Activity: Sufficiently Active (02/10/2024)   Exercise Vital Sign    Days of Exercise per Week: 6 days    Minutes of Exercise per Session: 30 min  Stress: No Stress Concern Present (02/10/2024)   [employer] of Occupational Health - Occupational Stress Questionnaire    Feeling of Stress : Not at all  Social Connections: Moderately Isolated (08/19/2024)   Social Connection and Isolation Panel    Frequency of Communication with Friends and Family: More than three times a week    Frequency of Social Gatherings with Friends and Family: More than three  times a week    Attends Religious Services: More than 4 times per year    Active Member of [employer] or Organizations: No    Attends Banker Meetings: Never    Marital Status: Widowed  Intimate Partner Violence: Not At Risk (08/19/2024)   Humiliation, Afraid, Rape, and Kick questionnaire    Fear of Current or Ex-Partner: No    Emotionally Abused: No    Physically Abused: No    Sexually Abused: No  Review of Systems:    Constitutional: No weight loss, fever or chills Cardiovascular: No chest pain Respiratory: No SOB  Gastrointestinal: See HPI and otherwise negative   Physical Exam:  Vital signs: BP 130/82   Pulse 60   Ht 5' (1.524 m)   Wt 141 lb (64 kg)   SpO2 99%   BMI 27.54 kg/m   Constitutional:   Pleasant elderly Caucasian female appears to be in NAD, Well developed, Well nourished, alert and cooperative Respiratory: Respirations even and unlabored. Lungs clear to auscultation bilaterally.   No wheezes, crackles, or rhonchi.  Cardiovascular: Normal S1, S2. No MRG. Regular rate and rhythm. No peripheral edema, cyanosis or pallor.  Gastrointestinal:  Soft, nondistended, nontender. No rebound or guarding. Normal bowel sounds. No appreciable masses or hepatomegaly. Rectal:  Not performed.  Psychiatric: Oriented to person, place and time. Demonstrates good judgement and reason without abnormal affect or behaviors.  MOST RECENT LABS AND IMAGING: CBC    Component Value Date/Time   WBC 7.8 09/26/2024 0955   RBC 4.82 09/26/2024 0955   HGB 13.7 09/26/2024 0955   HGB 13.4 07/03/2024 1017   HGB 16.1 (H) 09/08/2017 1028   HGB 14.6 02/13/2008 1506   HCT 42.4 09/26/2024 0955   HCT 47.6 (H) 09/08/2017 1028   HCT 42.0 02/13/2008 1506   PLT 242.0 09/26/2024 0955   PLT 262 07/03/2024 1017   PLT 218 09/08/2017 1028   PLT 234 02/13/2008 1506   MCV 88.1 09/26/2024 0955   MCV 90 09/08/2017 1028   MCV 87.9 02/13/2008 1506   MCH 27.7 08/20/2024 0547   MCHC 32.3 09/26/2024  0955   RDW 16.0 (H) 09/26/2024 0955   RDW 14.0 09/08/2017 1028   RDW 13.5 02/13/2008 1506   LYMPHSABS 1.9 09/26/2024 0955   LYMPHSABS 2.0 09/08/2017 1028   LYMPHSABS 2.2 02/13/2008 1506   MONOABS 1.3 (H) 09/26/2024 0955   MONOABS 0.9 02/13/2008 1506   EOSABS 0.4 09/26/2024 0955   EOSABS 0.4 09/08/2017 1028   BASOSABS 0.1 09/26/2024 0955   BASOSABS 0.0 09/08/2017 1028   BASOSABS 0.0 02/13/2008 1506    CMP     Component Value Date/Time   NA 141 08/20/2024 0547   NA 142 09/08/2017 1028   K 4.0 08/20/2024 0547   K 4.5 09/08/2017 1028   CL 107 08/20/2024 0547   CL 103 09/14/2014 1111   CO2 20 (L) 08/20/2024 0547   CO2 26 09/08/2017 1028   GLUCOSE 105 (H) 08/20/2024 0547   GLUCOSE 99 09/08/2017 1028   GLUCOSE 102 09/14/2014 1111   BUN 12 08/20/2024 0547   BUN 18.9 09/08/2017 1028   CREATININE 0.75 08/20/2024 0547   CREATININE 0.82 07/03/2024 1017   CREATININE 1.2 (H) 09/08/2017 1028   CALCIUM  8.9 08/20/2024 0547   CALCIUM  9.8 09/08/2017 1028   PROT 6.3 (L) 08/18/2024 1605   PROT 8.0 09/08/2017 1028   ALBUMIN 3.8 08/18/2024 1605   ALBUMIN 4.1 09/08/2017 1028   AST 28 08/18/2024 1605   AST 24 07/03/2024 1017   AST 21 09/08/2017 1028   ALT 13 08/18/2024 1605   ALT 13 07/03/2024 1017   ALT 15 09/08/2017 1028   ALKPHOS 52 08/18/2024 1605   ALKPHOS 57 09/08/2017 1028   BILITOT 0.7 08/18/2024 1605   BILITOT 0.6 07/03/2024 1017   BILITOT 0.57 09/08/2017 1028   GFRNONAA >60 08/20/2024 0547   GFRNONAA >60 07/03/2024 1017   GFRAA >60 09/07/2019 0957    Assessment: 1.  Ischemic colitis:  Seen at time of colonoscopy in September after receiving epinephrine  for fire  ant bites, patient doing well now with no continued symptoms  Plan: 1.  Discussed pathophysiology of ischemic colitis.  Most likely related to epinephrine  given for fire  ant bites.  Patient has been fine since then. 2.  Discussed Pantoprazole  this was started empirically in the hospital given GI bleed, no symptoms  since this medication ran out over the weekend.  She can discontinue for now. 3.  Patient will follow in clinic with us  as needed.  Delon Failing, PA-C [facility] 10/30/2024, 8:21 AM  Cc: Rilla Baller, MD

## 2024-10-31 NOTE — Progress Notes (Signed)
 Attending Physician's Attestation   I have reviewed the chart.   I agree with the Advanced Practitioner's note, impression, and recommendations with any updates as below. Glad that she is improved and agree that most likely chain of events outlined is the etiology for the more significant ischemic colitis.  In any case, as long as she does not develop iron deficiency anemia or progressive changes in bowel habits, it is reasonable to hold on repeat endoscopic evaluation.  If things do develop, at least a sigmoidoscopy could be considered since her changes were in the left colon.  Follow-up as needed is very reasonable.   Aloha Finner, MD [facility] Advanced Endoscopy Office # [phone]

## 2024-12-05 ENCOUNTER — Ambulatory Visit: Admitting: Hematology & Oncology

## 2024-12-05 ENCOUNTER — Inpatient Hospital Stay

## 2024-12-06 LAB — HM MAMMOGRAPHY

## 2024-12-07 ENCOUNTER — Encounter: Payer: Self-pay | Admitting: Family Medicine

## 2024-12-07 ENCOUNTER — Ambulatory Visit: Payer: Self-pay | Admitting: Family Medicine

## 2024-12-07 NOTE — Telephone Encounter (Signed)
 Called patient reviewed information and repeated back to me. Will call if not contacted to get scheduled over the next week to get scheduled. Will reach out to our office with any questions or concerns

## 2024-12-29 NOTE — Telephone Encounter (Signed)
 Called [facility] patient had imaging yesterday.  No further action needed at this time.

## 2025-02-12 ENCOUNTER — Ambulatory Visit

## 2025-02-27 ENCOUNTER — Encounter: Admitting: Family Medicine
